# Patient Record
Sex: Male | Born: 1960
Health system: Southern US, Community
[De-identification: ages and names within clinical notes are randomized; demographics above are authoritative.]

## PROBLEM LIST (undated history)

## (undated) DIAGNOSIS — E119 Type 2 diabetes mellitus without complications: Secondary | ICD-10-CM

## (undated) DIAGNOSIS — I10 Essential (primary) hypertension: Secondary | ICD-10-CM

## (undated) DIAGNOSIS — E785 Hyperlipidemia, unspecified: Secondary | ICD-10-CM

## (undated) DIAGNOSIS — M549 Dorsalgia, unspecified: Secondary | ICD-10-CM

## (undated) HISTORY — PX: OTHER SURGICAL HISTORY: SHX169

## (undated) HISTORY — DX: Hyperlipidemia, unspecified: E78.5

## (undated) HISTORY — PX: SPINAL FUSION: SHX223

## (undated) HISTORY — PX: LUMBAR FUSION: SHX111

## (undated) HISTORY — DX: Dorsalgia, unspecified: M54.9

---

## 2015-08-31 ENCOUNTER — Telehealth: Payer: Self-pay | Admitting: Behavioral Health

## 2015-08-31 NOTE — Telephone Encounter (Signed)
Unable to reach patient at time of Pre-Visit Call.  Left message for patient to return call when available.    

## 2015-09-01 ENCOUNTER — Ambulatory Visit (INDEPENDENT_AMBULATORY_CARE_PROVIDER_SITE_OTHER): Payer: BLUE CROSS/BLUE SHIELD | Admitting: Medical

## 2015-09-01 ENCOUNTER — Encounter: Payer: Self-pay | Admitting: Medical

## 2015-09-01 VITALS — BP 128/80 | HR 89 | Temp 98.2°F | Ht 69.75 in | Wt 204.6 lb

## 2015-09-01 DIAGNOSIS — K429 Umbilical hernia without obstruction or gangrene: Secondary | ICD-10-CM

## 2015-09-01 DIAGNOSIS — M5440 Lumbago with sciatica, unspecified side: Secondary | ICD-10-CM

## 2015-09-01 DIAGNOSIS — G894 Chronic pain syndrome: Secondary | ICD-10-CM

## 2015-09-01 MED ORDER — OXYCODONE-ACETAMINOPHEN 10-325 MG PO TABS: ORAL_TABLET | ORAL | Status: DC

## 2015-09-01 MED FILL — OXYCODONE/APAP 10/325 MG TA: 10-325 | 30 days supply | Qty: 60 | Fill #0

## 2015-09-01 NOTE — Patient Instructions (Addendum)
For your chronic severe back pain I am filling rx of your oxycodone. You signed contract for controlled med today.  I am going to refer you to neurosurgeon. Please given release of information forms to check out staff and make sure filled out correctly.  If you have severe pain with leg weakness, numbness, foot drop, or incontinence then ED evaluation.  Will refer you to general surgeon to evaluate your umbilical hernia. If you increasing pain over hernia site then ED evaluation as well.  Follow up in one month or as needed

## 2015-09-01 NOTE — Progress Notes (Signed)
Pre visit review using our clinic review tool, if applicable. No additional management support is needed unless otherwise documented below in the visit note. 

## 2015-09-01 NOTE — Progress Notes (Signed)
Subjective:    Patient ID: Jeffrey Austin, male    DOB: [birth year], 55 y.o.   MRN: [id number]  HPI  I have reviewed pt PMH, PSH, FH, Social History and Surgical History.   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   Pt in wanting refill for medication. Pt is on pain medication. Pt has history of spinal fusion in August 2016.(L4-S1 was the area of Surgery). Pt had surgery done in [place] and moved 3 weeks ago.  Pt had CT scan done and per pt his pelvic bone pushing on spine related to bon overgrowth.(per pt). Prior surgeon states will need injection. Pt takes percocet twice daily since August 2016.(this is first refill of medications he is getting in state of [place])  High level pain without medications.  Also has umbilical hernia. He states secondary to his surgery. Hernia hurts intermittently. But not daily or severe.    Review of Systems  Constitutional: Negative for fever, chills and fatigue.  Respiratory: Negative for cough, chest tightness, shortness of breath and wheezing.   Cardiovascular: Negative for chest pain and palpitations.  Gastrointestinal: Negative for nausea, abdominal pain, diarrhea and constipation.       Umbilical hernia.  Genitourinary: Negative for dysuria and flank pain.  Musculoskeletal: Positive for back pain. Negative for arthralgias, gait problem and neck pain.  Skin: Negative for rash.  Neurological: Negative for dizziness and headaches.       Some radicular pain to both legs.  Hematological: Negative for adenopathy. Does not bruise/bleed easily.  Psychiatric/Behavioral: Negative for behavioral problems and confusion.    Past Medical History  Diagnosis Date  . Back pain      Social History   Social History  . Marital Status: Married    Spouse Name: N/A  . Number of Children: N/A  . Years of Education: N/A   Occupational History  . Not on file.   Social History  Main Topics  . Smoking status: Never Smoker   . Smokeless tobacco: Never Used  . Alcohol Use: 0.0 oz/week    0 Standard drinks or equivalent per week     Comment: 1-2 beers a week  . Drug Use: Yes  . Sexual Activity: Yes   Other Topics Concern  . Not on file   Social History Narrative  . No narrative on file    Past Surgical History  Procedure Laterality Date  . Spinal fusion    . Lumbar fusion    . Cyst removed from testicle       Family History  Problem Relation Age of Onset  . Leukemia Father     No Known Allergies  No current outpatient prescriptions on file prior to visit.   No current facility-administered medications on file prior to visit.    BP 128/80 mmHg  Pulse 89  Temp(Src) 98.2 F (36.8 C) (Oral)  Ht 5' 9.75" (1.772 m)  Wt 204 lb 9.6 oz (92.806 kg)  BMI 29.56 kg/m2  SpO2 98%       Objective:   Physical Exam  General Appearance- Not in acute distress. But appears to have severe pain on changing positions on exam.    Chest and Lung Exam Auscultation: Breath sounds:-Normal. Clear even and unlabored. Adventitious sounds:- No Adventitious sounds.  Cardiovascular Auscultation:Rythm - Regular, rate and rythm. Heart Sounds -Normal heart sounds.  Abdomen Inspection:-Inspection Normal.  Palpation/Perucssion: Palpation and Percussion of the  abdomen reveal- Non Tender(moderate sized umbilical hernia), No Rebound tenderness, No rigidity(Guarding) and No Palpable abdominal masses.  Liver:-Normal.  Spleen:- Normal.   Back Mid lumbar spine tenderness to palpation. Pain on straight leg lift. Pain on lateral movements and flexion/extension of the spine.  Lower ext neurologic  L5-S1 sensation intact bilaterally. Normal patellar reflexes bilaterally. No foot drop bilaterally.      Assessment & Plan:  For your chronic severe back pain I am filling rx of your oxycodone. You signed contract for controlled med today.  I am going to refer you to  neurosurgeon. Please given release of information forms to check out staff and make sure filled out correctly.  If you have severe pain with leg weakness, numbness, foot drop, or incontinence then ED evaluation.  Will refer you to general surgeon to evaluate your umbilical hernia. If you increasing pain over hernia site then ED evaluation as well.  Follow up in one month or as needed  Mina Babula, Percell Miller, [place]

## 2015-09-07 ENCOUNTER — Telehealth: Payer: Self-pay | Admitting: *Deleted

## 2015-09-07 NOTE — Telephone Encounter (Signed)
Forwarded to [employer]. JG//CMA

## 2015-09-15 ENCOUNTER — Ambulatory Visit: Payer: Self-pay | Admitting: General Surgery

## 2015-09-29 ENCOUNTER — Other Ambulatory Visit: Payer: Self-pay | Admitting: Medical

## 2015-09-29 NOTE — Telephone Encounter (Signed)
Advise on this refill as is not on current/or historical list.

## 2015-09-29 NOTE — Telephone Encounter (Signed)
Caller name: Kathaleen Maser Relation to pt: spouse Call back number: [phone] Pharmacy: [facility]  Reason for call: Pt's wife came in office requesting refill on Betamethasone Valerate lotion USP, 0.1% 85ml for spouse. Wife states pt was seen recently but pt forgot to ask for rx. Please advise.

## 2015-09-29 NOTE — Telephone Encounter (Signed)
I reviewed note and no mention of skin condition. He was supposed to follow up in a month anyway. Don't feel comfortable for writing rx for condition that have not reviewed. So can he schedule appointment tomorrow or when I am back in office.?

## 2015-09-30 ENCOUNTER — Encounter: Payer: Self-pay | Admitting: Medical

## 2015-09-30 ENCOUNTER — Other Ambulatory Visit: Payer: Self-pay

## 2015-09-30 MED ORDER — OXYCODONE-ACETAMINOPHEN 10-325 MG PO TABS: ORAL_TABLET | ORAL | 0 refills | Status: DC

## 2015-09-30 MED FILL — OXYCODONE-APAP 10-325 TAB: 10-325 | 30 days supply | Qty: 60 | Fill #0

## 2015-09-30 NOTE — Telephone Encounter (Signed)
Left message for pt to call back in reference to the previous note from [place].

## 2015-09-30 NOTE — Telephone Encounter (Signed)
Called left message to call back 

## 2015-09-30 NOTE — Telephone Encounter (Signed)
Spoke with pt and his oxycodone has been printed and waiting for pcp signature.

## 2015-09-30 NOTE — Telephone Encounter (Signed)
error:[id number] ° °

## 2015-09-30 NOTE — Telephone Encounter (Signed)
Spoke with pt and advised him that his Rx for his Percocet  would be ready up front this afternoon. Pt did not have any further questions.

## 2015-10-10 ENCOUNTER — Encounter: Payer: Self-pay | Admitting: Medical

## 2015-10-10 ENCOUNTER — Ambulatory Visit (INDEPENDENT_AMBULATORY_CARE_PROVIDER_SITE_OTHER): Payer: BLUE CROSS/BLUE SHIELD | Admitting: Medical

## 2015-10-10 VITALS — BP 122/80 | HR 78 | Temp 98.2°F | Ht 70.0 in | Wt 209.4 lb

## 2015-10-10 DIAGNOSIS — L309 Dermatitis, unspecified: Secondary | ICD-10-CM

## 2015-10-10 DIAGNOSIS — K429 Umbilical hernia without obstruction or gangrene: Secondary | ICD-10-CM

## 2015-10-10 DIAGNOSIS — M5440 Lumbago with sciatica, unspecified side: Secondary | ICD-10-CM | POA: Diagnosis not present

## 2015-10-10 DIAGNOSIS — G894 Chronic pain syndrome: Secondary | ICD-10-CM

## 2015-10-10 DIAGNOSIS — G4700 Insomnia, unspecified: Secondary | ICD-10-CM

## 2015-10-10 MED ORDER — BETAMETHASONE VALERATE 0.1 % EX LOTN: 1.0000 "application " | TOPICAL_LOTION | Freq: Every day | CUTANEOUS | 0 refills | Status: DC

## 2015-10-10 MED FILL — BETAMETHASONE VA 0.1% LOTIO: 0.1 | 30 days supply | Qty: 60 | Fill #0

## 2015-10-10 NOTE — Progress Notes (Signed)
Pre visit review using our clinic review tool, if applicable. No additional management support is needed unless otherwise documented below in the visit note./HSM  

## 2015-10-10 NOTE — Patient Instructions (Addendum)
For your back pain continue oxycodone. Please get UDS today. When you run out of current rx call our office and we can have you come by and pick up next refill.  When you see neurosurgeon and determine if surgery needed again for your back please notify us. Give [place] update on potential hernia surgery as well. That way can schedule you colonoscopy as well.  For dermatitis will refill your betamethasone. (but use sparingly as we discussed)  For your insomnia [facility] should be able to refill your ambien.   Follow up 3 months or as needed

## 2015-10-10 NOTE — Progress Notes (Signed)
Subjective:    Patient ID: Jeffrey Austin, male    DOB: 26-Jul-1960, 55 y.o.   MRN: [id number]  HPI   Pt in for follow up.  Pt is still has some chronic and severe back pain.  Pt has seen specialist/general surgeon. Pt states general surgeon wants him to see neurosurgeon before he gets hernia surgery.  Pt anticipates that he might have some problems with getting back surgery again. Last surgery about one year ago and he states took 7 hours.  Pt has been on oxycodone twice daily(takes at 7 am and at 4 pm)  Pt is seeing neurosurgeon on 10-25-2015.   On review of pt maintenance needed appears needs colonoscopy.   Pt states tdap last years.  Pt uses bethamasone valerate. He gets dry flaky skin around nares and between eyes. Uses just one time a month.  For your insomnia [facility] They should be able to refill your ambien.  Also he has history of insomnia. He uses ambien rarely. It appears he has active refill of his zolpidem.  No incontinence, no saddle anesthesia and no foot drop on review.           Review of Systems  Constitutional: Negative for chills, fatigue and fever.  Respiratory: Negative for cough, choking, shortness of breath and wheezing.   Cardiovascular: Negative for chest pain and palpitations.  Gastrointestinal: Negative for abdominal pain and diarrhea.  Endocrine: Negative for polydipsia, polyphagia and polyuria.  Musculoskeletal: Positive for back pain.       Radiclular pain.  Skin: Positive for rash.       History of dermititis.  Neurological: Negative for dizziness, syncope, weakness, light-headedness and headaches.  Hematological: Negative for adenopathy. Does not bruise/bleed easily.  Psychiatric/Behavioral: Negative for behavioral problems and confusion.     Past Medical History:  Diagnosis Date  . Back pain      Social History   Social History  . Marital status: Married    Spouse name: N/A  . Number of children:  N/A  . Years of education: N/A   Occupational History  . Not on file.   Social History Main Topics  . Smoking status: Never Smoker  . Smokeless tobacco: Never Used  . Alcohol use 0.0 oz/week     Comment: 1-2 beers a week  . Drug use:   . Sexual activity: Yes   Other Topics Concern  . Not on file   Social History Narrative  . No narrative on file    Past Surgical History:  Procedure Laterality Date  . cyst removed from testicle     . LUMBAR FUSION    . SPINAL FUSION      Family History  Problem Relation Age of Onset  . Leukemia Father     No Known Allergies  Current Outpatient Prescriptions on File Prior to Visit  Medication Sig Dispense Refill  . oxyCODONE-acetaminophen (PERCOCET) 10-325 MG tablet 1 tab po bid 60 tablet 0   No current facility-administered medications on file prior to visit.     BP 122/80 (BP Location: Left Arm, Patient Position: Sitting, Cuff Size: Normal)   Pulse 78   Temp 98.2 F (36.8 C) (Oral)   Ht 5\' 10"  (1.778 m)   Wt 209 lb 6.4 oz (95 kg)   SpO2 98%   BMI 30.05 kg/m       Objective:   Physical Exam  General Appearance- Not in acute distress.    Chest and Lung  Exam Auscultation: Breath sounds:-Normal. Clear even and unlabored. Adventitious sounds:- No Adventitious sounds.  Cardiovascular Auscultation:Rythm - Regular, rate and rythm. Heart Sounds -Normal heart sounds.  Abdomen Inspection:-Inspection Normal.  Palpation/Perucssion: Palpation and Percussion of the abdomen reveal- Non Tender, No Rebound tenderness, No rigidity(Guarding) and No Palpable abdominal masses.  Liver:-Normal.  Spleen:- Normal.   Back Mid lumbar spine tenderness to palpation. Pain on lateral movements and flexion/extension of the spine.  Lower ext neurologic  L5-S1 sensation intact bilaterally. Normal patellar reflexes bilaterally. No foot drop bilaterally.   Skin- on his face. No active lesions/moles. No discoloration. No flaky skin  presently.     Assessment & Plan:  For your back pain continue oxycodone. Please get UDS today. When you run out of current rx call our office and we can have you come by and pick up next refill.  When you see neurosurgeon and determine if surgery needed again for your back please notify us. Give [place] update on potential hernia surgery as well. That way can schedule you colonoscopy as well.  For dermatitis will refill your betamethasone. (but use sparingly as we discussed)  Follow up 3 months or as needed

## 2015-10-31 ENCOUNTER — Telehealth: Payer: Self-pay | Admitting: Medical

## 2015-10-31 MED ORDER — OXYCODONE-ACETAMINOPHEN 10-325 MG PO TABS: ORAL_TABLET | ORAL | 0 refills | Status: DC

## 2015-10-31 MED FILL — OXYCODONE-APAP 10-325 TAB: 10-325 | 30 days supply | Qty: 60 | Fill #0

## 2015-10-31 NOTE — Telephone Encounter (Signed)
Pt calling to get percocet early. I had filled his med as he was taken it in the past. Each rx was twice a day.(Enough for one month) Now he is asking for refill sooner?  Per contract he signed it states if he runs out early won't get refill.   Let me know his situation.What happened. With tight strict guidelines/[place] laws, I would need to run this by my supervisor.

## 2015-10-31 NOTE — Telephone Encounter (Signed)
Refill request for Medication oxyCODONE-acetaminophen (PERCOCET) 10-325 MG tablet    CB: [phone]

## 2015-10-31 NOTE — Telephone Encounter (Signed)
Noted  

## 2015-10-31 NOTE — Telephone Encounter (Signed)
At lunch I realized that pt probably does need rx now. So would you please disregard the last note regarding refill of percocet until I review.

## 2015-10-31 NOTE — Telephone Encounter (Signed)
I did refill his percocet rx. He can pick up rx. I will leave it up front. I did notify pt.   Pt states old records still in mail.   So neurosurgeon will see him now on Sept 5, 2017.  I notified pt.

## 2015-11-22 ENCOUNTER — Other Ambulatory Visit (HOSPITAL_COMMUNITY): Payer: Self-pay | Admitting: Neurosurgery

## 2015-11-22 DIAGNOSIS — M4726 Other spondylosis with radiculopathy, lumbar region: Secondary | ICD-10-CM

## 2015-11-23 ENCOUNTER — Other Ambulatory Visit: Payer: Self-pay | Admitting: Neurosurgery

## 2015-11-29 ENCOUNTER — Telehealth: Payer: Self-pay | Admitting: Medical

## 2015-11-29 MED ORDER — OXYCODONE-ACETAMINOPHEN 10-325 MG PO TABS: ORAL_TABLET | ORAL | 0 refills | Status: DC

## 2015-11-29 NOTE — Telephone Encounter (Signed)
Called and informed patient that he coud pick up medication.

## 2015-11-29 NOTE — Telephone Encounter (Signed)
RX filed at front for pick up

## 2015-11-29 NOTE — Telephone Encounter (Signed)
Caller name: Relationship to patient: Self Can be reached: [phone]  Pharmacy:  Reason for call: Request refill on oxyCODONE-acetaminophen (PERCOCET) 10-325 MG tablet [id number]

## 2015-11-29 NOTE — Telephone Encounter (Signed)
°  Relation to [id number] Call back number:[phone]   Reason for call:  Patient states he will have only 1 pill left of oxyCODONE-acetaminophen (PERCOCET) 10-325 MG tablet due to the fact he took a xtra pill due to the pain he's experiencing. Patient requesting 1 pill please advise

## 2015-11-29 NOTE — Telephone Encounter (Signed)
Pt can pick up his percocet from office.

## 2015-11-29 NOTE — Progress Notes (Signed)
Scheduling pre op appt- please COMPLETE SURGICAL ORDERS  Thanks--(previous orders from 09/15/15 will "fall off" after 3 months )

## 2015-11-30 NOTE — Telephone Encounter (Signed)
Regarding Jeffrey Austin pain. Would you offer him toradol 60 mg im nurse visit today. This I think will get him through until tomorrow as  toradol can be used for kidney stones, severe migraines, broken bones and severe back pain. Then start back on pain med tomorrow. This is work around since [employer] are very strict.

## 2015-12-01 MED FILL — OXYCODONE-APAP 10-325: 10-325 | 30 days supply | Qty: 60 | Fill #0

## 2015-12-09 ENCOUNTER — Encounter: Payer: Self-pay | Admitting: Medical

## 2015-12-09 ENCOUNTER — Ambulatory Visit (INDEPENDENT_AMBULATORY_CARE_PROVIDER_SITE_OTHER): Payer: Medicare Other | Admitting: Medical

## 2015-12-09 ENCOUNTER — Encounter (HOSPITAL_COMMUNITY): Payer: Self-pay

## 2015-12-09 ENCOUNTER — Telehealth: Payer: Self-pay | Admitting: Medical

## 2015-12-09 VITALS — BP 126/80 | HR 72 | Temp 98.3°F | Ht 70.0 in | Wt 209.2 lb

## 2015-12-09 DIAGNOSIS — I10 Essential (primary) hypertension: Secondary | ICD-10-CM

## 2015-12-09 DIAGNOSIS — E118 Type 2 diabetes mellitus with unspecified complications: Secondary | ICD-10-CM

## 2015-12-09 DIAGNOSIS — G894 Chronic pain syndrome: Secondary | ICD-10-CM | POA: Diagnosis not present

## 2015-12-09 DIAGNOSIS — E119 Type 2 diabetes mellitus without complications: Secondary | ICD-10-CM | POA: Diagnosis not present

## 2015-12-09 LAB — COMPREHENSIVE METABOLIC PANEL
ALBUMIN: 3.9 g/dL (ref 3.6–5.1)
ALT: 65 U/L — ABNORMAL HIGH (ref 9–46)
AST: 66 U/L — AB (ref 10–35)
Alkaline Phosphatase: 80 U/L (ref 40–115)
BILIRUBIN TOTAL: 1.1 mg/dL (ref 0.2–1.2)
BUN: 9 mg/dL (ref 7–25)
CALCIUM: 9.4 mg/dL (ref 8.6–10.3)
CO2: 29 mmol/L (ref 20–31)
CREATININE: 0.91 mg/dL (ref 0.70–1.33)
Chloride: 93 mmol/L — ABNORMAL LOW (ref 98–110)
Glucose, Bld: 207 mg/dL — ABNORMAL HIGH (ref 65–99)
Potassium: 3.8 mmol/L (ref 3.5–5.3)
SODIUM: 131 mmol/L — AB (ref 135–146)
Total Protein: 7.1 g/dL (ref 6.1–8.1)

## 2015-12-09 LAB — HEMOGLOBIN A1C
HEMOGLOBIN A1C: 10 % — AB (ref <5.7)
MEAN PLASMA GLUCOSE: 240 mg/dL

## 2015-12-09 LAB — GLUCOSE, POCT (MANUAL RESULT ENTRY)

## 2015-12-09 MED ORDER — METFORMIN HCL 1000 MG PO TABS: 1000.0000 mg | ORAL_TABLET | Freq: Two times a day (BID) | ORAL | 3 refills | Status: DC

## 2015-12-09 MED FILL — metFORMIN HCL 1000 MG TABS: 1000 | 30 days supply | Qty: 60 | Fill #0

## 2015-12-09 NOTE — Patient Instructions (Addendum)
For your diabetes and recent high blood sugars will get cmp and a1c today. Will rx metformin tab to take twice daily.  For your blood pressure continue amlodipine. I don't think you hyzaar presently. If bp increases in future may add back on.  For your back pain continue the percocet. Follow up with neurosurgeon.  For your hernia see general surgeon.  Follow up in with me in 3 months or as needed. On follow up please schedule early am appointment so we can do fasting lipid panel.

## 2015-12-09 NOTE — Progress Notes (Signed)
Pre visit review using our clinic tool,if applicable. No additional management support is needed unless otherwise documented below in the visit note.  

## 2015-12-09 NOTE — Progress Notes (Signed)
Subjective:    Patient ID: Jeffrey Austin, male    DOB: 10-Feb-1961, 55 y.o.   MRN: [id number]  HPI  Pt in for for follow up.  Pt  Update me on med he was before while in [place]. He realized just recently was on hyzaar but had not been taking. He has some at home.  Only on amlodipine 10 mg a day on recent visits with me.Marland Kitchen His bp has been controlled. No cardiac or neurologic signs or symptoms.  Pt was also on metformin 1000 mg one tab a day. Pt blood sugar was 178 yesterday. Today when we checked was 215. He was on that dose since his back injury. Pt moved form [place] in July. Last saw his prior provider march or April.He had stopped using  metformin around April. No hyperglycemic signs or symptom.  Pt still has severe lower back pain and he is seeing his specialist. He is going to go through diagnostic procedure with neursurgeon. This will be on January 18, 2016.  Pt is going to get hernia repair in November as well.  Pt is not fasting.  Pt declines flu vaccine. He states never had flu or vaccine in the past.  Review of Systems  Constitutional: Negative for chills, fatigue and fever.  Respiratory: Negative for cough, chest tightness and wheezing.   Cardiovascular: Negative for chest pain and palpitations.  Gastrointestinal: Negative for abdominal pain.  Endocrine: Negative for polydipsia, polyphagia and polyuria.  Musculoskeletal: Positive for back pain. Negative for arthralgias, gait problem, myalgias, neck pain and neck stiffness.  Skin: Negative for rash.  Neurological: Negative for dizziness, weakness and headaches.  Hematological: Negative for adenopathy. Does not bruise/bleed easily.  Psychiatric/Behavioral: Negative for behavioral problems and confusion.    Past Medical History:  Diagnosis Date  . Back pain      Social History   Social History  . Marital status: Married    Spouse name: N/A  . Number of children: N/A  . Years of education: N/A   Occupational  History  . Not on file.   Social History Main Topics  . Smoking status: Never Smoker  . Smokeless tobacco: Never Used  . Alcohol use 0.0 oz/week     Comment: 1-2 beers a week  . Drug use:   . Sexual activity: Yes   Other Topics Concern  . Not on file   Social History Narrative  . No narrative on file    Past Surgical History:  Procedure Laterality Date  . cyst removed from testicle     . LUMBAR FUSION    . SPINAL FUSION      Family History  Problem Relation Age of Onset  . Leukemia Father     No Known Allergies  Current Outpatient Prescriptions on File Prior to Visit  Medication Sig Dispense Refill  . amLODipine (NORVASC) 10 MG tablet     . betamethasone valerate lotion (VALISONE) 0.1 % Apply 1 application topically daily. 60 mL 0  . oxyCODONE-acetaminophen (PERCOCET) 10-325 MG tablet 1 tab po bid 60 tablet 0   No current facility-administered medications on file prior to visit.     BP 126/80   Pulse 72   Temp 98.3 F (36.8 C) (Oral)   Ht 5\' 10"  (1.778 m)   Wt 209 lb 3.2 oz (94.9 kg)   SpO2 99%   BMI 30.02 kg/m       Objective:   Physical Exam  General Mental Status- Alert. General Appearance- Not in  acute distress.   Skin General: Color- Normal Color. Moisture- Normal Moisture.  Neck Carotid Arteries- Normal color. Moisture- Normal Moisture. No carotid bruits. No JVD.  Chest and Lung Exam Auscultation: Breath Sounds:-Normal.  Cardiovascular Auscultation:Rythm- Regular. Murmurs & Other Heart Sounds:Auscultation of the heart reveals- No Murmurs.  Abdomen Inspection:-Inspeection Normal. Palpation/Percussion:Note:No mass. Palpation and Percussion of the abdomen reveal- Non Tender, Non Distended + BS, no rebound or guarding. Obvious umbilical area hernia on palpation.    Neurologic Cranial Nerve exam:- CN III-XII intact(No nystagmus), symmetric smile. Drift Test:- No drift. Romberg Exam:- Negative.  Heal to Toe Gait exam:-Normal. Finger  to Nose:- Normal/Intact Strength:- 5/5 equal and symmetric strength both upper and lower extremities.  Back- lumbar spine pain on sitting. Marland Kitchen He is uncomfortable standing in any one position for any period of time as well(he explains this is his baseline chronic pain) .       Assessment & Plan:  For your diabetes and recent high blood sugars will get cmp and a1c today. Will rx metformin tab to take twice daily.  For your blood pressure continue amlodipine. I don't think you hyzaar presently. If bp increases in future may add back on.  For your back pain continue the percocet. Follow up with neurosurgeon.  For your hernia see general surgeon.  Follow up in with me in 3 months or as needed. On follow up please schedule early am appointment so we can do fasting lipid panel.  Patriciaann Rabanal, Percell Miller, PA-C

## 2015-12-16 ENCOUNTER — Ambulatory Visit: Payer: Self-pay | Admitting: General Surgery

## 2015-12-16 NOTE — Progress Notes (Signed)
Surgery on 10/18.   Preop on 10/16  Need orders in EPIC.  Thank You.

## 2015-12-19 ENCOUNTER — Encounter (HOSPITAL_COMMUNITY)
Admission: RE | Admit: 2015-12-19 | Discharge: 2015-12-19 | Disposition: A | Discharge status: Home / Self Care | Payer: BLUE CROSS/BLUE SHIELD | Source: Ambulatory Visit | Attending: General Surgery | Admitting: General Surgery

## 2015-12-19 ENCOUNTER — Encounter (HOSPITAL_COMMUNITY): Payer: Self-pay

## 2015-12-19 DIAGNOSIS — I10 Essential (primary) hypertension: Secondary | ICD-10-CM | POA: Diagnosis not present

## 2015-12-19 DIAGNOSIS — E119 Type 2 diabetes mellitus without complications: Secondary | ICD-10-CM | POA: Diagnosis not present

## 2015-12-19 DIAGNOSIS — Z01818 Encounter for other preprocedural examination: Secondary | ICD-10-CM | POA: Insufficient documentation

## 2015-12-19 HISTORY — DX: Type 2 diabetes mellitus without complications: E11.9

## 2015-12-19 HISTORY — DX: Essential (primary) hypertension: I10

## 2015-12-19 LAB — CBC WITH DIFFERENTIAL/PLATELET
BASOS ABS: 0 10*3/uL (ref 0.0–0.1)
BASOS PCT: 0 %
Eosinophils Absolute: 0 10*3/uL (ref 0.0–0.7)
Eosinophils Relative: 1 %
HEMATOCRIT: 46.2 % (ref 39.0–52.0)
HEMOGLOBIN: 16.8 g/dL (ref 13.0–17.0)
LYMPHS PCT: 23 %
Lymphs Abs: 1.8 10*3/uL (ref 0.7–4.0)
MCH: 33.9 pg (ref 26.0–34.0)
MCHC: 36.4 g/dL — ABNORMAL HIGH (ref 30.0–36.0)
MCV: 93.3 fL (ref 78.0–100.0)
Monocytes Absolute: 0.7 10*3/uL (ref 0.1–1.0)
Monocytes Relative: 10 %
NEUTROS ABS: 5 10*3/uL (ref 1.7–7.7)
NEUTROS PCT: 66 %
Platelets: 160 10*3/uL (ref 150–400)
RBC: 4.95 MIL/uL (ref 4.22–5.81)
RDW: 12 % (ref 11.5–15.5)
WBC: 7.5 10*3/uL (ref 4.0–10.5)

## 2015-12-19 LAB — BASIC METABOLIC PANEL
ANION GAP: 12 (ref 5–15)
BUN: 9 mg/dL (ref 6–20)
CALCIUM: 9.7 mg/dL (ref 8.9–10.3)
CO2: 29 mmol/L (ref 22–32)
Chloride: 96 mmol/L — ABNORMAL LOW (ref 101–111)
Creatinine, Ser: 0.82 mg/dL (ref 0.61–1.24)
GLUCOSE: 185 mg/dL — AB (ref 65–99)
POTASSIUM: 3.7 mmol/L (ref 3.5–5.1)
Sodium: 137 mmol/L (ref 135–145)

## 2015-12-19 LAB — GLUCOSE, CAPILLARY: Glucose-Capillary: 177 mg/dL — ABNORMAL HIGH (ref 65–99)

## 2015-12-19 MED FILL — AMOXICILLIN 500 MG CAPSULE: 500 | 5 days supply | Qty: 15 | Fill #0

## 2015-12-19 MED FILL — CHLORHEXIDINE 0.12% RINSE: 0.12 | 5 days supply | Qty: 473 | Fill #0

## 2015-12-19 MED FILL — LIDOCAINE 2% VISCOUS SOLN: 2 | 4 days supply | Qty: 100 | Fill #0

## 2015-12-19 NOTE — Progress Notes (Signed)
Patient states he started with a toothache over the weekend. Temp. 99.1 at beginning of interview and at end of interview , temp was 100.2. Patient states has a pinpoint scab on inner aspect of forearm with a red ring halo around it about the size of a dime. Patient thinks it was from a spider bite. Instructed patient and spouse to call Dr. Amie Portland office and let them know of this and to get an appointment with a Dentist to be seen before Wednesday.  Patient and spouse verbalized understanding.

## 2015-12-19 NOTE — Progress Notes (Signed)
08/17/2014- PA and Lateral chest x-ray from [facility] in [place], [place] on chart.

## 2015-12-19 NOTE — Patient Instructions (Addendum)
Jeffrey Austin  12/19/2015   Your procedure is scheduled on: Wednesday 12/21/2015  Report to [name].  Call this number if you have problems the morning of surgery [phone]   Remember: ONLY 1 PERSON MAY GO WITH YOU TO SHORT STAY TO GET  READY MORNING OF [name].   Do not eat food or drink liquids :After Midnight.     Take these medicines the morning of surgery with A SIP OF WATER: none  DO NOT TAKE ANY DIABETIC MEDICATIONS DAY OF YOUR SURGERY!                               You may not have any metal on your body including hair pins and              piercings  Do not wear jewelry, make-up, lotions, powders or perfumes, deodorant             Do not wear nail polish.  Do not shave  48 hours prior to surgery.              Men may shave face and neck.   Do not bring valuables to the hospital. [name].  Contacts, dentures or bridgework may not be worn into surgery.  Leave suitcase in the car. After surgery it may be brought to your room.     r:  Special Instructions: N/A              Please read over the following fact sheets you were given: _____________________________________________________________________             [name] - Preparing for Surgery Before surgery, you can play an important role.  Because skin is not sterile, your skin needs to be as free of germs as possible.  You can reduce the number of germs on your skin by washing with CHG (chlorahexidine gluconate) soap before surgery.  CHG is an antiseptic cleaner which kills germs and bonds with the skin to continue killing germs even after washing. Please DO NOT use if you have an allergy to CHG or antibacterial soaps.  If your skin becomes reddened/irritated stop using the CHG and inform your nurse when you arrive at Short Stay. Do not shave  (including legs and underarms) for at least 48 hours prior to the first CHG shower.  You may shave your face/neck. Please follow these instructions carefully:  1.  Shower with CHG Soap the night before surgery and the  morning of Surgery.  2.  If you choose to wash your hair, wash your hair first as usual with your  normal  shampoo.  3.  After you shampoo, rinse your hair and body thoroughly to remove the  shampoo.                           4.  Use CHG as you would any other liquid soap.  You can apply chg directly  to the skin and wash  Gently with a scrungie or clean washcloth.  5.  Apply the CHG Soap to your body ONLY FROM THE NECK DOWN.   Do not use on face/ open                           Wound or open sores. Avoid contact with eyes, ears mouth and genitals (private parts).                       Wash face,  Genitals (private parts) with your normal soap.             6.  Wash thoroughly, paying special attention to the area where your surgery  will be performed.  7.  Thoroughly rinse your body with warm water from the neck down.  8.  DO NOT shower/wash with your normal soap after using and rinsing off  the CHG Soap.                9.  Pat yourself dry with a clean towel.            10.  Wear clean pajamas.            11.  Place clean sheets on your bed the night of your first shower and do not  sleep with pets. Day of Surgery : Do not apply any lotions/deodorants the morning of surgery.  Please wear clean clothes to the hospital/surgery center.  FAILURE TO FOLLOW THESE INSTRUCTIONS MAY RESULT IN THE CANCELLATION OF YOUR SURGERY ________________________________________________________________________ How to Manage Your Diabetes Before and After Surgery  Why is it important to control my blood sugar before and after surgery? . Improving blood sugar levels before and after surgery helps healing and can limit problems. . A way of improving blood sugar control is eating a  healthy diet by: o  Eating less sugar and carbohydrates o  Increasing activity/exercise o  Talking with your doctor about reaching your blood sugar goals . High blood sugars (greater than 180 mg/dL) can raise your risk of infections and slow your recovery, so you will need to focus on controlling your diabetes during the weeks before surgery. . Make sure that the doctor who takes care of your diabetes knows about your planned surgery including the date and location.  How do I manage my blood sugar before surgery? . Check your blood sugar at least 4 times a day, starting 2 days before surgery, to make sure that the level is not too high or low. o Check your blood sugar the morning of your surgery when you wake up and every 2 hours until you get to the Short Stay unit. . If your blood sugar is less than 70 mg/dL, you will need to treat for low blood sugar: o Do not take insulin. o Treat a low blood sugar (less than 70 mg/dL) with  cup of clear juice (cranberry or apple), 4 glucose tablets, OR glucose gel. o Recheck blood sugar in 15 minutes after treatment (to make sure it is greater than 70 mg/dL). If your blood sugar is not greater than 70 mg/dL on recheck, call [phone] for further instructions. . Report your blood sugar to the short stay nurse when you get to Short Stay.  . If you are admitted to the hospital after surgery: o Your blood sugar will be checked by the staff and you will probably be given insulin after surgery (instead of  oral diabetes medicines) to make sure you have good blood sugar levels. o The goal for blood sugar control after surgery is 80-180 mg/dL.   WHAT DO I DO ABOUT MY DIABETES MEDICATION?   Marland Kitchen Do not take oral diabetes medicines (pills) the morning of surgery.  . THE NIGHT BEFORE SURGERY, take ______0_____ units of ___________insulin.       . THE MORNING OF SURGERY, take _______0______ units of __________insulin.  . The day of surgery, do not take other  diabetes injectables, including Byetta (exenatide), Bydureon (exenatide ER), Victoza (liraglutide), or Trulicity (dulaglutide).  . If your CBG is greater than 220 mg/dL, come on to the hospital at [name] am !  Patient Signature:  Date:   Nurse Signature:  Date:   Reviewed and Endorsed by [facility] Patient Education Committee, August 2015

## 2015-12-21 DIAGNOSIS — Z01818 Encounter for other preprocedural examination: Secondary | ICD-10-CM | POA: Diagnosis not present

## 2015-12-23 ENCOUNTER — Ambulatory Visit (HOSPITAL_COMMUNITY): Payer: BLUE CROSS/BLUE SHIELD

## 2015-12-23 ENCOUNTER — Other Ambulatory Visit (HOSPITAL_COMMUNITY): Payer: BLUE CROSS/BLUE SHIELD

## 2015-12-27 MED FILL — CHLORHEXIDINE 0.12% RINSE: 0.12 | 16 days supply | Qty: 473 | Fill #0

## 2015-12-27 MED FILL — IBUPROFEN 600 MG TABLET: 600 | 7 days supply | Qty: 30 | Fill #0

## 2015-12-27 MED FILL — AMOX-CLAV 875-125 MG TABLET: 875-125 | 14 days supply | Qty: 28 | Fill #0

## 2015-12-30 ENCOUNTER — Telehealth: Payer: Self-pay | Admitting: Medical

## 2015-12-30 MED ORDER — OXYCODONE-ACETAMINOPHEN 10-325 MG PO TABS: ORAL_TABLET | ORAL | 0 refills | Status: DC

## 2015-12-30 MED FILL — OXYCODONE-APAP 10-325: 10-325 | 30 days supply | Qty: 60 | Fill #0

## 2015-12-30 NOTE — Telephone Encounter (Signed)
Appears due for his pain meds. Printed and signed. He can pick up today.

## 2015-12-30 NOTE — Telephone Encounter (Signed)
I left a message that his Rx. Rx is ready at front desk.

## 2015-12-30 NOTE — Telephone Encounter (Signed)
Please advise 

## 2015-12-30 NOTE — Telephone Encounter (Signed)
Relation to [id number] Call back [phone]   Reason for call:  Patient requesting a refill oxyCODONE-acetaminophen (PERCOCET) 10-325 MG tablet

## 2016-01-06 NOTE — Telephone Encounter (Signed)
error:[id number] ° °

## 2016-01-11 MED FILL — AMOX-CLAV 875-125 MG TABLET: 875-125 | 21 days supply | Qty: 42 | Fill #0

## 2016-01-18 ENCOUNTER — Ambulatory Visit (HOSPITAL_COMMUNITY)
Admission: RE | Admit: 2016-01-18 | Discharge: 2016-01-18 | Disposition: A | Discharge status: Home / Self Care | Payer: BLUE CROSS/BLUE SHIELD | Source: Ambulatory Visit | Attending: Neurosurgery | Admitting: Neurosurgery

## 2016-01-18 DIAGNOSIS — M5126 Other intervertebral disc displacement, lumbar region: Secondary | ICD-10-CM | POA: Diagnosis not present

## 2016-01-18 DIAGNOSIS — M4726 Other spondylosis with radiculopathy, lumbar region: Secondary | ICD-10-CM

## 2016-01-18 LAB — GLUCOSE, CAPILLARY: Glucose-Capillary: 195 mg/dL — ABNORMAL HIGH (ref 65–99)

## 2016-01-18 MED ORDER — OXYCODONE HCL 5 MG PO TABS
ORAL_TABLET | ORAL | Status: AC
  Filled 2016-01-18: qty 1

## 2016-01-18 MED ORDER — OXYCODONE HCL 5 MG PO TABS
5.0000 mg | ORAL_TABLET | ORAL | Status: DC | PRN
Start: 2016-01-18 — End: 2016-01-19
  Administered 2016-01-18: 5 mg via ORAL

## 2016-01-18 MED ORDER — DIAZEPAM 5 MG PO TABS
10.0000 mg | ORAL_TABLET | Freq: Once | ORAL | Status: AC
  Administered 2016-01-18: 10 mg via ORAL
  Filled 2016-01-18: qty 2

## 2016-01-18 MED ORDER — ONDANSETRON HCL 4 MG/2ML IJ SOLN: 4.0000 mg | Freq: Four times a day (QID) | INTRAMUSCULAR | Status: DC | PRN

## 2016-01-18 MED ORDER — LIDOCAINE HCL (PF) 1 % IJ SOLN
5.0000 mL | Freq: Once | INTRAMUSCULAR | Status: AC
  Administered 2016-01-18: 5 mL via INTRADERMAL

## 2016-01-18 MED ORDER — IOPAMIDOL (ISOVUE-M 200) INJECTION 41%
20.0000 mL | Freq: Once | INTRAMUSCULAR | Status: AC
  Administered 2016-01-18: 20 mL via INTRATHECAL

## 2016-01-18 MED ORDER — DIAZEPAM 5 MG PO TABS
ORAL_TABLET | ORAL | Status: AC
  Administered 2016-01-18: 10 mg via ORAL
  Filled 2016-01-18: qty 2

## 2016-01-18 NOTE — Op Note (Signed)
  Lumbar Myelogram  PATIENT:  Jeffrey Austin is a 55 y.o. male  PRE-OPERATIVE DIAGNOSIS:  lumbago  POST-OPERATIVE DIAGNOSIS:  SAME  PROCEDURE:  Lumbar Myelogram  SURGEON:  Rogerio Boutelle  ANESTHESIA:   local LOCAL MEDICATIONS USED:  LIDOCAINE  and Amount: 9 ml Procedure Note: Jeffrey Austin is a 55 y.o. male Was taken to the fluoroscopy suite and  positioned prone on the fluoroscopy table. His back was prepared and draped in a sterile manner. I infiltrated 9 cc into the lumbar region. I then introduced a spinal needle into the thecal sac at the 4/5 interlaminar space. I infiltrated 20cc of Isovue 200 into the thecal sac. Fluoroscopy showed the needle and contrast in the thecal sac. Jeffrey Austin tolerated the procedure well. he Will be taken to CT for evaluation.     PATIENT DISPOSITION:  Short Stay, hemodynamically stable

## 2016-01-18 NOTE — Discharge Instructions (Signed)
Myelogram and Lumbar Puncture Discharge Instructions ° °1. Go home and rest quietly for the next 24 hours.  It is important to lie flat for the next 24 hours.  Get up only to go to the restroom.  You may lie in the bed or on a couch on your back, your stomach, your left side or your right side.  You may have one pillow under your head.  You may have pillows between your knees while you are on your side or under your knees while you are on your back. ° °2. DO NOT drive today.  Recline the seat as far back as it will go, while still wearing your seat belt, on the way home. ° °3. You may get up to go to the bathroom as needed.  You may sit up for 10 minutes to eat.  You may resume your normal diet and medications unless otherwise indicated. ° °4. The incidence of headache, nausea, or vomiting is about 5% (one in 20 patients).  If you develop a headache, lie flat and drink plenty of fluids until the headache goes away.  Caffeinated beverages may be helpful.  If you develop severe nausea and vomiting or a headache that does not go away with flat bed rest, call [phone]. ° °5. You may resume normal activities after your 24 hours of bed rest is over; however, do not exert yourself strongly or do any heavy lifting tomorrow. ° °6. Call your physician for a follow-up appointment.  The results of your myelogram will be sent directly to your physician by the following day. ° °7. If you have any questions or if complications develop after you arrive home, please call [phone]. ° °Discharge instructions have been explained to the patient.  The patient, or the person responsible for the patient, fully understands these instructions. ° ° °

## 2016-01-25 NOTE — Patient Instructions (Addendum)
Jeffrey Austin  01/25/2016   Your procedure is scheduled on: 02/01/16  Report to [facility] Main  Entrance take [place]  elevators to 3rd floor to  [facility]  AM.  Call this number if you have problems the morning of surgery [phone]   Remember: ONLY 1 PERSON MAY GO WITH YOU TO SHORT STAY TO GET  READY MORNING OF YOUR SURGERY.   Do not eat food or drink liquids :After Midnight.     Take these medicines the morning of surgery with A SIP OF WATER: Amlodipine   , Percocet                            How to Manage Your Diabetes Before and After Surgery  Why is it important to control my blood sugar before and after surgery? . Improving blood sugar levels before and after surgery helps healing and can limit problems. . A way of improving blood sugar control is eating a healthy diet by: o  Eating less sugar and carbohydrates o  Increasing activity/exercise o  Talking with your doctor about reaching your blood sugar goals . High blood sugars (greater than 180 mg/dL) can raise your risk of infections and slow your recovery, so you will need to focus on controlling your diabetes during the weeks before surgery. . Make sure that the doctor who takes care of your diabetes knows about your planned surgery including the date and location.  How do I manage my blood sugar before surgery? . Check your blood sugar at least 4 times a day, starting 2 days before surgery, to make sure that the level is not too high or low. o Check your blood sugar the morning of your surgery when you wake up and every 2 hours until you get to the Short Stay unit. . If your blood sugar is less than 70 mg/dL, you will need to treat for low blood sugar: o . o Treat a low blood sugar (less than 70 mg/dL) with  cup of clear juice (cranberry or apple), 4 glucose tablets, OR glucose gel. o Recheck blood sugar in 15 minutes after treatment (to make sure it is greater than 70 mg/dL).  If your blood sugar is not greater than 70 mg/dL on recheck, call [phone] for further instructions. . Report your blood sugar to the short stay nurse when you get to Short Stay.  . If you are admitted to the hospital after surgery: o Your blood sugar will be checked by the staff and you will probably be given insulin after surgery (instead of oral diabetes medicines) to make sure you have good blood sugar levels. o The goal for blood sugar control after surgery is 80-180 mg/dL.   WHAT DO I DO ABOUT MY DIABETES MEDICATION?  Marland Kitchen Do not take oral diabetes medicines (pills) the morning of surgery.                                 You may not have any metal on your body including hair pins and              piercings  Do not wear jewelry, make-up, lotions, powders or perfumes, deodorant             Do not wear  nail polish.  Do not shave  48 hours prior to surgery.              Men may shave face and neck.   Do not bring valuables to the hospital. [place].  Contacts, dentures or bridgework may not be worn into surgery.  Leave suitcase in the car. After surgery it may be brought to your room.               Please read over the following fact sheets you were given: _____________________________________________________________________             [facility] - Preparing for Surgery Before surgery, you can play an important role.  Because skin is not sterile, your skin needs to be as free of germs as possible.  You can reduce the number of germs on your skin by washing with CHG (chlorahexidine gluconate) soap before surgery.  CHG is an antiseptic cleaner which kills germs and bonds with the skin to continue killing germs even after washing. Please DO NOT use if you have an allergy to CHG or antibacterial soaps.  If your skin becomes reddened/irritated stop using the CHG and inform your nurse when you arrive at Short Stay. Do not shave  (including legs and underarms) for at least 48 hours prior to the first CHG shower.  You may shave your face/neck. Please follow these instructions carefully:  1.  Shower with CHG Soap the night before surgery and the  morning of Surgery.  2.  If you choose to wash your hair, wash your hair first as usual with your  normal  shampoo.  3.  After you shampoo, rinse your hair and body thoroughly to remove the  shampoo.                           4.  Use CHG as you would any other liquid soap.  You can apply chg directly  to the skin and wash                       Gently with a scrungie or clean washcloth.  5.  Apply the CHG Soap to your body ONLY FROM THE NECK DOWN.   Do not use on face/ open                           Wound or open sores. Avoid contact with eyes, ears mouth and genitals (private parts).                       Wash face,  Genitals (private parts) with your normal soap.             6.  Wash thoroughly, paying special attention to the area where your surgery  will be performed.  7.  Thoroughly rinse your body with warm water from the neck down.  8.  DO NOT shower/wash with your normal soap after using and rinsing off  the CHG Soap.                9.  Pat yourself dry with a clean towel.            10.  Wear clean pajamas.  11.  Place clean sheets on your bed the night of your first shower and do not  sleep with pets. Day of Surgery : Do not apply any lotions/deodorants the morning of surgery.  Please wear clean clothes to the hospital/surgery center.  FAILURE TO FOLLOW THESE INSTRUCTIONS MAY RESULT IN THE CANCELLATION OF YOUR SURGERY PATIENT SIGNATURE_________________________________  NURSE SIGNATURE__________________________________  ________________________________________________________________________

## 2016-01-25 NOTE — Progress Notes (Signed)
Dr Kieth Brightly-   Please put in epic the consent for surgery-  Since he came in for pre op and was cancelled, the order has been released and I can not view it---PST APPT 01/30/16

## 2016-01-30 ENCOUNTER — Other Ambulatory Visit: Payer: Self-pay

## 2016-01-30 ENCOUNTER — Encounter (HOSPITAL_COMMUNITY)
Admission: RE | Admit: 2016-01-30 | Discharge: 2016-01-30 | Disposition: A | Discharge status: Home / Self Care | Payer: BLUE CROSS/BLUE SHIELD | Source: Ambulatory Visit | Attending: General Surgery | Admitting: General Surgery

## 2016-01-30 ENCOUNTER — Encounter (HOSPITAL_COMMUNITY): Payer: Self-pay

## 2016-01-30 ENCOUNTER — Ambulatory Visit: Payer: Self-pay | Admitting: General Surgery

## 2016-01-30 ENCOUNTER — Telehealth: Payer: Self-pay | Admitting: Medical

## 2016-01-30 DIAGNOSIS — K432 Incisional hernia without obstruction or gangrene: Secondary | ICD-10-CM | POA: Diagnosis not present

## 2016-01-30 DIAGNOSIS — I10 Essential (primary) hypertension: Secondary | ICD-10-CM | POA: Diagnosis not present

## 2016-01-30 DIAGNOSIS — Z01812 Encounter for preprocedural laboratory examination: Secondary | ICD-10-CM | POA: Insufficient documentation

## 2016-01-30 DIAGNOSIS — E119 Type 2 diabetes mellitus without complications: Secondary | ICD-10-CM | POA: Diagnosis not present

## 2016-01-30 LAB — GLUCOSE, CAPILLARY: GLUCOSE-CAPILLARY: 274 mg/dL — AB (ref 65–99)

## 2016-01-30 LAB — CBC
HCT: 46.6 % (ref 39.0–52.0)
HEMOGLOBIN: 16.6 g/dL (ref 13.0–17.0)
MCH: 34.1 pg — AB (ref 26.0–34.0)
MCHC: 35.6 g/dL (ref 30.0–36.0)
MCV: 95.7 fL (ref 78.0–100.0)
Platelets: 150 10*3/uL (ref 150–400)
RBC: 4.87 MIL/uL (ref 4.22–5.81)
RDW: 13 % (ref 11.5–15.5)
WBC: 8.3 10*3/uL (ref 4.0–10.5)

## 2016-01-30 LAB — BASIC METABOLIC PANEL
ANION GAP: 12 (ref 5–15)
BUN: 8 mg/dL (ref 6–20)
CALCIUM: 9.8 mg/dL (ref 8.9–10.3)
CHLORIDE: 98 mmol/L — AB (ref 101–111)
CO2: 27 mmol/L (ref 22–32)
CREATININE: 0.82 mg/dL (ref 0.61–1.24)
GFR calc Af Amer: >60 mL/min (ref >60)
GFR calc non Af Amer: >60 mL/min (ref >60)
GLUCOSE: 247 mg/dL — AB (ref 65–99)
Potassium: 4.1 mmol/L (ref 3.5–5.1)
Sodium: 137 mmol/L (ref 135–145)

## 2016-01-30 MED ORDER — OXYCODONE-ACETAMINOPHEN 10-325 MG PO TABS: ORAL_TABLET | ORAL | 0 refills | Status: DC

## 2016-01-30 MED FILL — OXYCODONE-APAP 10-325: 10-325 | 30 days supply | Qty: 60 | Fill #0

## 2016-01-30 NOTE — Telephone Encounter (Signed)
Called patient to let him know he can pick up Rx for Oxycodone at front desk. Patient agreed.

## 2016-01-30 NOTE — Progress Notes (Signed)
Please place orders in EPIC! Patient got cancelled from previous date so orders were released for that surgery! Thank you! She has appointment at PST 01/30/2016 at 1000 am.

## 2016-01-30 NOTE — Telephone Encounter (Signed)
Ok'd and printed pt percocet. He can pick up today

## 2016-01-30 NOTE — Telephone Encounter (Signed)
Relation to [id number] Call back [phone]   Reason for call:  Patient requesting a refill oxyCODONE-acetaminophen (PERCOCET) 10-325 MG tablet

## 2016-01-30 NOTE — Progress Notes (Signed)
Today's CBG- 274. Results routed to Dr. Kieth Brightly!

## 2016-01-30 NOTE — Telephone Encounter (Signed)
Please advise 

## 2016-02-01 ENCOUNTER — Encounter (HOSPITAL_COMMUNITY): Payer: Self-pay | Admitting: *Deleted

## 2016-02-01 ENCOUNTER — Inpatient Hospital Stay (HOSPITAL_COMMUNITY): Payer: BLUE CROSS/BLUE SHIELD | Admitting: Anesthesiology

## 2016-02-01 ENCOUNTER — Encounter (HOSPITAL_COMMUNITY)
Admission: RE | Disposition: A | Discharge status: Home / Self Care | Payer: Self-pay | Source: Ambulatory Visit | Attending: General Surgery

## 2016-02-01 ENCOUNTER — Ambulatory Visit (HOSPITAL_COMMUNITY)
Admission: RE | Admit: 2016-02-01 | Discharge: 2016-02-01 | Disposition: A | Discharge status: Home / Self Care | Payer: BLUE CROSS/BLUE SHIELD | Source: Ambulatory Visit | Attending: General Surgery | Admitting: General Surgery

## 2016-02-01 DIAGNOSIS — I10 Essential (primary) hypertension: Secondary | ICD-10-CM | POA: Insufficient documentation

## 2016-02-01 DIAGNOSIS — E119 Type 2 diabetes mellitus without complications: Secondary | ICD-10-CM | POA: Diagnosis not present

## 2016-02-01 DIAGNOSIS — K432 Incisional hernia without obstruction or gangrene: Secondary | ICD-10-CM | POA: Insufficient documentation

## 2016-02-01 HISTORY — PX: INSERTION OF MESH: SHX5868

## 2016-02-01 HISTORY — PX: INCISIONAL HERNIA REPAIR: SHX193

## 2016-02-01 LAB — GLUCOSE, CAPILLARY
Glucose-Capillary: 223 mg/dL — ABNORMAL HIGH (ref 65–99)
Glucose-Capillary: 189 mg/dL — ABNORMAL HIGH (ref 65–99)

## 2016-02-01 SURGERY — REPAIR, HERNIA, INCISIONAL, LAPAROSCOPIC
Anesthesia: General

## 2016-02-01 MED ORDER — SUGAMMADEX SODIUM 200 MG/2ML IV SOLN
INTRAVENOUS | Status: DC | PRN
  Administered 2016-02-01: 200 mg via INTRAVENOUS

## 2016-02-01 MED ORDER — OXYCODONE HCL 5 MG/5ML PO SOLN
5.0000 mg | Freq: Once | ORAL | Status: DC | PRN
  Filled 2016-02-01: qty 5

## 2016-02-01 MED ORDER — CEFAZOLIN SODIUM-DEXTROSE 2-4 GM/100ML-% IV SOLN
2.0000 g | INTRAVENOUS | Status: AC
  Administered 2016-02-01: 2 g via INTRAVENOUS
  Filled 2016-02-01: qty 100

## 2016-02-01 MED ORDER — BUPIVACAINE HCL (PF) 0.25 % IJ SOLN
INTRAMUSCULAR | Status: AC
  Filled 2016-02-01: qty 30

## 2016-02-01 MED ORDER — SUFENTANIL CITRATE 50 MCG/ML IV SOLN
INTRAVENOUS | Status: DC | PRN
  Administered 2016-02-01 (×2): 5 ug via INTRAVENOUS
  Administered 2016-02-01: 20 ug via INTRAVENOUS
  Administered 2016-02-01: 10 ug via INTRAVENOUS

## 2016-02-01 MED ORDER — CELECOXIB 200 MG PO CAPS: 400.0000 mg | ORAL_CAPSULE | ORAL | Status: DC

## 2016-02-01 MED ORDER — MIDAZOLAM HCL 2 MG/2ML IJ SOLN
INTRAMUSCULAR | Status: AC
  Filled 2016-02-01: qty 2

## 2016-02-01 MED ORDER — ONDANSETRON HCL 4 MG/2ML IJ SOLN
INTRAMUSCULAR | Status: AC
  Filled 2016-02-01: qty 2

## 2016-02-01 MED ORDER — ONDANSETRON HCL 4 MG/2ML IJ SOLN
INTRAMUSCULAR | Status: DC | PRN
  Administered 2016-02-01: 4 mg via INTRAVENOUS

## 2016-02-01 MED ORDER — ROCURONIUM BROMIDE 10 MG/ML (PF) SYRINGE
PREFILLED_SYRINGE | INTRAVENOUS | Status: DC | PRN
  Administered 2016-02-01: 40 mg via INTRAVENOUS

## 2016-02-01 MED ORDER — KETAMINE HCL 10 MG/ML IJ SOLN
INTRAMUSCULAR | Status: DC | PRN
Start: 2016-02-01 — End: 2016-02-01
  Administered 2016-02-01 (×2): 10 mg via INTRAVENOUS
  Administered 2016-02-01: 50 mg via INTRAVENOUS

## 2016-02-01 MED ORDER — BUPIVACAINE HCL (PF) 0.5 % IJ SOLN
INTRAMUSCULAR | Status: AC
  Filled 2016-02-01: qty 30

## 2016-02-01 MED ORDER — PROPOFOL 10 MG/ML IV BOLUS
INTRAVENOUS | Status: DC | PRN
  Administered 2016-02-01: 100 mg via INTRAVENOUS

## 2016-02-01 MED ORDER — OXYCODONE HCL 5 MG PO TABS: 5.0000 mg | ORAL_TABLET | Freq: Once | ORAL | Status: DC | PRN

## 2016-02-01 MED ORDER — HYDROCODONE-ACETAMINOPHEN 5-325 MG PO TABS
1.0000 | ORAL_TABLET | Freq: Four times a day (QID) | ORAL | 0 refills | Status: DC | PRN
Start: 2016-02-01 — End: 2016-02-23

## 2016-02-01 MED ORDER — DEXAMETHASONE SODIUM PHOSPHATE 10 MG/ML IJ SOLN
INTRAMUSCULAR | Status: AC
  Filled 2016-02-01: qty 1

## 2016-02-01 MED ORDER — SUCCINYLCHOLINE CHLORIDE 200 MG/10ML IV SOSY
PREFILLED_SYRINGE | INTRAVENOUS | Status: DC | PRN
  Administered 2016-02-01: 160 mg via INTRAVENOUS

## 2016-02-01 MED ORDER — HYDROMORPHONE HCL 1 MG/ML IJ SOLN: 0.2500 mg | INTRAMUSCULAR | Status: DC | PRN

## 2016-02-01 MED ORDER — ACETAMINOPHEN 500 MG PO TABS: 1000.0000 mg | ORAL_TABLET | ORAL | Status: DC

## 2016-02-01 MED ORDER — SUFENTANIL CITRATE 50 MCG/ML IV SOLN
INTRAVENOUS | Status: AC
  Filled 2016-02-01: qty 1

## 2016-02-01 MED ORDER — LIDOCAINE 2% (20 MG/ML) 5 ML SYRINGE
INTRAMUSCULAR | Status: AC
  Filled 2016-02-01: qty 5

## 2016-02-01 MED ORDER — BUPIVACAINE HCL (PF) 0.5 % IJ SOLN
INTRAMUSCULAR | Status: DC | PRN
  Administered 2016-02-01: 20 mL

## 2016-02-01 MED ORDER — ACETAMINOPHEN 500 MG PO TABS
1000.0000 mg | ORAL_TABLET | ORAL | Status: AC
  Administered 2016-02-01: 1000 mg via ORAL
  Filled 2016-02-01: qty 2

## 2016-02-01 MED ORDER — ROCURONIUM BROMIDE 50 MG/5ML IV SOSY
PREFILLED_SYRINGE | INTRAVENOUS | Status: AC
  Filled 2016-02-01: qty 5

## 2016-02-01 MED ORDER — GABAPENTIN 300 MG PO CAPS
300.0000 mg | ORAL_CAPSULE | ORAL | Status: AC
  Administered 2016-02-01: 300 mg via ORAL
  Filled 2016-02-01: qty 1

## 2016-02-01 MED ORDER — CEFAZOLIN SODIUM-DEXTROSE 2-4 GM/100ML-% IV SOLN
INTRAVENOUS | Status: AC
  Filled 2016-02-01: qty 100

## 2016-02-01 MED ORDER — KETAMINE HCL 10 MG/ML IJ SOLN
INTRAMUSCULAR | Status: AC
  Filled 2016-02-01: qty 1

## 2016-02-01 MED ORDER — DEXAMETHASONE SODIUM PHOSPHATE 10 MG/ML IJ SOLN
INTRAMUSCULAR | Status: DC | PRN
  Administered 2016-02-01: 10 mg via INTRAVENOUS

## 2016-02-01 MED ORDER — IBUPROFEN 800 MG PO TABS: 800.0000 mg | ORAL_TABLET | Freq: Three times a day (TID) | ORAL | 0 refills | Status: DC | PRN

## 2016-02-01 MED ORDER — SUCCINYLCHOLINE CHLORIDE 200 MG/10ML IV SOSY
PREFILLED_SYRINGE | INTRAVENOUS | Status: AC
  Filled 2016-02-01: qty 10

## 2016-02-01 MED ORDER — CHLORHEXIDINE GLUCONATE CLOTH 2 % EX PADS: 6.0000 | MEDICATED_PAD | Freq: Once | CUTANEOUS | Status: DC

## 2016-02-01 MED ORDER — LIDOCAINE 2% (20 MG/ML) 5 ML SYRINGE
INTRAMUSCULAR | Status: DC | PRN
  Administered 2016-02-01: 100 mg via INTRAVENOUS

## 2016-02-01 MED ORDER — LACTATED RINGERS IV SOLN
INTRAVENOUS | Status: DC
  Administered 2016-02-01: 1000 mL via INTRAVENOUS
  Administered 2016-02-01: 12:00:00 via INTRAVENOUS

## 2016-02-01 MED ORDER — PROPOFOL 10 MG/ML IV BOLUS
INTRAVENOUS | Status: AC
  Filled 2016-02-01: qty 20

## 2016-02-01 MED ORDER — BUPIVACAINE LIPOSOME 1.3 % IJ SUSP
20.0000 mL | Freq: Once | INTRAMUSCULAR | Status: AC
  Administered 2016-02-01: 20 mL
  Filled 2016-02-01: qty 20

## 2016-02-01 MED ORDER — SUGAMMADEX SODIUM 200 MG/2ML IV SOLN
INTRAVENOUS | Status: AC
  Filled 2016-02-01: qty 2

## 2016-02-01 MED ORDER — CEFAZOLIN SODIUM-DEXTROSE 2-4 GM/100ML-% IV SOLN
2.0000 g | INTRAVENOUS | Status: DC
  Filled 2016-02-01: qty 100

## 2016-02-01 MED ORDER — ONDANSETRON HCL 4 MG/2ML IJ SOLN: 4.0000 mg | Freq: Four times a day (QID) | INTRAMUSCULAR | Status: DC | PRN

## 2016-02-01 MED ORDER — MIDAZOLAM HCL 2 MG/2ML IJ SOLN
INTRAMUSCULAR | Status: DC | PRN
  Administered 2016-02-01: 2 mg via INTRAVENOUS

## 2016-02-01 MED ORDER — CELECOXIB 200 MG PO CAPS
400.0000 mg | ORAL_CAPSULE | ORAL | Status: AC
  Administered 2016-02-01: 400 mg via ORAL
  Filled 2016-02-01: qty 2

## 2016-02-01 SURGICAL SUPPLY — 37 items
APPLIER CLIP 5 13 M/L LIGAMAX5 (MISCELLANEOUS)
BINDER ABDOMINAL 12 ML 46-62 (SOFTGOODS) IMPLANT
CABLE HIGH FREQUENCY MONO STRZ (ELECTRODE) ×3 IMPLANT
CHLORAPREP W/TINT 26ML (MISCELLANEOUS) ×3 IMPLANT
CLIP APPLIE 5 13 M/L LIGAMAX5 (MISCELLANEOUS) IMPLANT
COVER SURGICAL LIGHT HANDLE (MISCELLANEOUS) ×3 IMPLANT
DECANTER SPIKE VIAL GLASS SM (MISCELLANEOUS) ×3 IMPLANT
DERMABOND ADVANCED (GAUZE/BANDAGES/DRESSINGS) ×2
DERMABOND ADVANCED .7 DNX12 (GAUZE/BANDAGES/DRESSINGS) ×1 IMPLANT
DEVICE SECURE STRAP 25 ABSORB (INSTRUMENTS) ×3 IMPLANT
DRAIN CHANNEL 19F RND (DRAIN) IMPLANT
ELECT REM PT RETURN 9FT ADLT (ELECTROSURGICAL) ×3
ELECTRODE REM PT RTRN 9FT ADLT (ELECTROSURGICAL) ×1 IMPLANT
EVACUATOR SILICONE 100CC (DRAIN) IMPLANT
GLOVE BIOGEL PI IND STRL 7.0 (GLOVE) ×1 IMPLANT
GLOVE BIOGEL PI INDICATOR 7.0 (GLOVE) ×2
GLOVE SURG SS PI 7.0 STRL IVOR (GLOVE) ×3 IMPLANT
GOWN STRL REUS W/TWL LRG LVL3 (GOWN DISPOSABLE) ×3 IMPLANT
GOWN STRL REUS W/TWL XL LVL3 (GOWN DISPOSABLE) ×6 IMPLANT
GRASPER SUT TROCAR 14GX15 (MISCELLANEOUS) ×3 IMPLANT
IRRIG SUCT STRYKERFLOW 2 WTIP (MISCELLANEOUS) ×3
IRRIGATION SUCT STRKRFLW 2 WTP (MISCELLANEOUS) ×1 IMPLANT
KIT BASIN OR (CUSTOM PROCEDURE TRAY) ×3 IMPLANT
MESH VENT LT ST 15.2CM CRL (Mesh General) ×3 IMPLANT
SCISSORS LAP 5X35 DISP (ENDOMECHANICALS) ×3 IMPLANT
SHEARS HARMONIC ACE PLUS 36CM (ENDOMECHANICALS) IMPLANT
SLEEVE XCEL OPT CAN 5 100 (ENDOMECHANICALS) ×3 IMPLANT
SUT ETHILON 2 0 PS N (SUTURE) IMPLANT
SUT MNCRL AB 4-0 PS2 18 (SUTURE) ×3 IMPLANT
SUT NOVA NAB GS-21 0 18 T12 DT (SUTURE) ×6 IMPLANT
SUT PDS AB 0 CT 36 (SUTURE) IMPLANT
SUT VICRYL 0 UR6 27IN ABS (SUTURE) IMPLANT
TOWEL OR 17X26 10 PK STRL BLUE (TOWEL DISPOSABLE) ×3 IMPLANT
TRAY LAPAROSCOPIC (CUSTOM PROCEDURE TRAY) ×3 IMPLANT
TROCAR BLADELESS OPT 5 100 (ENDOMECHANICALS) ×3 IMPLANT
TROCAR XCEL 12X100 BLDLESS (ENDOMECHANICALS) ×3 IMPLANT
TUBING INSUF HEATED (TUBING) ×3 IMPLANT

## 2016-02-01 NOTE — Progress Notes (Signed)
Nine abdominal puncture sites intact with glue.  Pt's wife verbalizes understanding of discharge instructions.

## 2016-02-01 NOTE — Anesthesia Preprocedure Evaluation (Signed)
Anesthesia Evaluation  Patient identified by MRN, date of birth, ID band Patient awake    Reviewed: Allergy & Precautions, H&P , NPO status , Patient's Chart, lab work & pertinent test results  Airway Mallampati: II   Neck ROM: full    Dental   Pulmonary neg pulmonary ROS,    breath sounds clear to auscultation       Cardiovascular hypertension,  Rhythm:regular Rate:Normal     Neuro/Psych    GI/Hepatic   Endo/Other  diabetes, Type 2  Renal/GU      Musculoskeletal   Abdominal   Peds  Hematology   Anesthesia Other Findings   Reproductive/Obstetrics                             Anesthesia Physical Anesthesia Plan  ASA: II  Anesthesia Plan: General   Post-op Pain Management:    Induction: Intravenous  Airway Management Planned: Oral ETT  Additional Equipment:   Intra-op Plan:   Post-operative Plan: Extubation in OR  Informed Consent: I have reviewed the patients History and Physical, chart, labs and discussed the procedure including the risks, benefits and alternatives for the proposed anesthesia with the patient or authorized representative who has indicated his/her understanding and acceptance.     Plan Discussed with: CRNA, Anesthesiologist and Surgeon  Anesthesia Plan Comments:         Anesthesia Quick Evaluation

## 2016-02-01 NOTE — Transfer of Care (Signed)
Immediate Anesthesia Transfer of Care Note  Patient: Jeffrey Austin  Procedure(s) Performed: Procedure(s): [place] (N/A) INSERTION OF MESH (N/A)  Patient Location: PACU  Anesthesia Type:General  Level of Consciousness: sedated  Airway & Oxygen Therapy: Patient Spontanous Breathing and Patient connected to face mask oxygen  Post-op Assessment: Report given to RN and Post -op Vital signs reviewed and stable  Post vital signs: Reviewed and stable  Last Vitals:  Vitals:   02/01/16 0829  BP: (!) 134/91  Pulse: (!) 103  Resp: 18  Temp: 36.8 C    Last Pain:  Vitals:   02/01/16 1021  TempSrc:   PainSc: 4       Patients Stated Pain Goal: 5 ([id number])  Complications: No apparent anesthesia complications

## 2016-02-01 NOTE — Anesthesia Postprocedure Evaluation (Signed)
Anesthesia Post Note  Patient: Jeffrey Austin  Procedure(s) Performed: Procedure(s) (LRB): LAPAROSCOPIC INCISIONAL HERNIA REPAIR (N/A) INSERTION OF MESH (N/A)  Patient location during evaluation: PACU Anesthesia Type: General Level of consciousness: awake and alert and patient cooperative Pain management: pain level controlled Vital Signs Assessment: post-procedure vital signs reviewed and stable Respiratory status: spontaneous breathing and respiratory function stable Cardiovascular status: stable Anesthetic complications: no    Last Vitals:  Vitals:   02/01/16 1230 02/01/16 1245  BP: (!) 136/95 (!) 135/94  Pulse: (!) 102 95  Resp: 15 13  Temp: 36.9 C     Last Pain:  Vitals:   02/01/16 1021  TempSrc:   PainSc: [place]

## 2016-02-01 NOTE — Discharge Instructions (Signed)
Laparoscopic  Hernia Repair, Care After This sheet gives you information about how to care for yourself after your procedure. Your health care provider may also give you more specific instructions. If you have problems or questions, contact your health care provider. What can I expect after the procedure? After the procedure, it is common to have:  Pain, discomfort, or soreness. Follow these instructions at home: Incision care  Follow instructions from your health care provider about how to take care of your incision. Make sure you:  Wash your hands with soap and water before you change your bandage (dressing) or before you touch your abdomen. If soap and water are not available, use hand sanitizer.  Change your dressing as told by your health care provider.  Leave stitches (sutures), skin glue, or adhesive strips in place. These skin closures may need to stay in place for 2 weeks or longer. If adhesive strip edges start to loosen and curl up, you may trim the loose edges. Do not remove adhesive strips completely unless your health care provider tells you to do that.  Check your incision area every day for signs of infection. Check for:  Redness, swelling, or pain.  Fluid or blood.  Warmth.  Pus or a bad smell. Bathing  Do not take baths, swim, or use a hot tub until your health care provider approves. Ask your health care provider if you can take showers. You may only be allowed to take sponge baths for bathing.  Keep your bandage (dressing) dry until your health care provider says it can be removed. Activity  Do not lift anything that is heavier than 10 lb (4.5 kg) until your health care provider approves.  Do not drive or use heavy machinery while taking prescription pain medicine. Ask your health care provider when it is safe for you to drive or use heavy machinery.  Do not drive for 24 hours if you were given a medicine to help you relax (sedative) during your  procedure.  Rest as told by your health care provider. You may return to your normal activities when your health care provider approves. General instructions  Take over-the-counter and prescription medicines only as told by your health care provider.  To prevent or treat constipation while you are taking prescription pain medicine, your health care provider may recommend that you:  Take over-the-counter or prescription medicines.  Eat foods that are high in fiber, such as fresh fruits and vegetables, whole grains, and beans.  Limit foods that are high in fat and processed sugars, such as fried and sweet foods.  Drink enough fluid to keep your urine clear or pale yellow.  Hold a pillow over your abdomen when you cough or sneeze. This helps with pain.  Keep all follow-up visits as told by your health care provider. This is important. Contact a health care provider if:  You have:  A fever or chills.  Redness, swelling, or pain around your incision.  Fluid or blood coming from your incision.  Pus or a bad smell coming from your incision.  Pain that gets worse or does not get better with medicine.  Nausea or vomiting.  A cough.  Shortness of breath.  Your incision feels warm to the touch.  You have not had a bowel movement in three days.  You are not able to urinate. Get help right away if:  You have severe pain in your abdomen.  You have persistent nausea and vomiting.  You have redness, warmth,  or pain in your leg.  You have chest pain.  You have trouble breathing. Summary  After this procedure, it is common to have pain, discomfort, or soreness.  Follow instructions from your health care provider about how to take care of your incision.  Check your incision area every day for signs of infection. Report any signs of infection to your health care provider.  Keep all follow-up visits as told by your health care provider. This is important. This information  is not intended to replace advice given to you by your health care provider. Make sure you discuss any questions you have with your health care provider. Document Released: 02/06/2012 Document Revised: 10/12/2015 Document Reviewed: 10/12/2015 Elsevier Interactive Patient Education  2017 [place] Anesthesia, Adult, Care After These instructions provide you with information about caring for yourself after your procedure. Your health care provider may also give you more specific instructions. Your treatment has been planned according to current medical practices, but problems sometimes occur. Call your health care provider if you have any problems or questions after your procedure. What can I expect after the procedure? After the procedure, it is common to have:  Vomiting.  A sore throat.  Mental slowness. It is common to feel:  Nauseous.  Cold or shivery.  Sleepy.  Tired.  Sore or achy, even in parts of your body where you did not have surgery. Follow these instructions at home: For at least 24 hours after the procedure:  Do not:  Participate in activities where you could fall or become injured.  Drive.  Use heavy machinery.  Drink alcohol.  Take sleeping pills or medicines that cause drowsiness.  Make important decisions or sign legal documents.  Take care of children on your own.  Rest. Eating and drinking  If you vomit, drink water, juice, or soup when you can drink without vomiting.  Drink enough fluid to keep your urine clear or pale yellow.  Make sure you have little or no nausea before eating solid foods.  Follow the diet recommended by your health care provider. General instructions  Have a responsible adult stay with you until you are awake and alert.  Return to your normal activities as told by your health care provider. Ask your health care provider what activities are safe for you.  Take over-the-counter and prescription medicines  only as told by your health care provider.  If you smoke, do not smoke without supervision.  Keep all follow-up visits as told by your health care provider. This is important. Contact a health care provider if:  You continue to have nausea or vomiting at home, and medicines are not helpful.  You cannot drink fluids or start eating again.  You cannot urinate after 8-12 hours.  You develop a skin rash.  You have fever.  You have increasing redness at the site of your procedure. Get help right away if:  You have difficulty breathing.  You have chest pain.  You have unexpected bleeding.  You feel that you are having a life-threatening or urgent problem. This information is not intended to replace advice given to you by your health care provider. Make sure you discuss any questions you have with your health care provider. Document Released: 05/28/2000 Document Revised: 07/25/2015 Document Reviewed: 02/03/2015 Elsevier Interactive Patient Education  2017 [employer].

## 2016-02-01 NOTE — H&P (Signed)
Jeffrey Austin is an 55 y.o. male.   Chief Complaint: incisional hernia HPI: 55 yo male with incision from previous anterior approach for back surgery. He has occasional soreness and dull ache over his abdomen. He denies nausea or vomiting  Past Medical History:  Diagnosis Date  . Back pain   . Diabetes mellitus without complication ([place])   . Hypertension     Past Surgical History:  Procedure Laterality Date  . cyst removed from testicle     . LUMBAR FUSION    . SPINAL FUSION      Family History  Problem Relation Age of Onset  . Leukemia Father    Social History:  reports that he has never smoked. He has never used smokeless tobacco. He reports that he drinks alcohol. He reports that he does not use drugs.  Allergies: No Known Allergies  Medications Prior to Admission  Medication Sig Dispense Refill  . amLODipine (NORVASC) 10 MG tablet Take 10 mg by mouth daily.    . metFORMIN (GLUCOPHAGE) 1000 MG tablet Take 1 tablet (1,000 mg total) by mouth 2 (two) times daily with a meal. 180 tablet 3  . oxyCODONE-acetaminophen (PERCOCET) 10-325 MG tablet 1 tab po bid 60 tablet 0  . amoxicillin-clavulanate (AUGMENTIN) 875-125 MG tablet Take 1 tablet by mouth 2 (two) times daily. Prevention for dental procedure for mandibular tori      Results for orders placed or performed during the hospital encounter of 02/01/16 (from the past 48 hour(s))  Glucose, capillary     Status: Abnormal   Collection Time: 02/01/16  8:25 AM  Result Value Ref Range   Glucose-Capillary 223 (H) 65 - 99 mg/dL   Comment 1 Notify RN    No results found.  Review of Systems  Constitutional: Negative for chills and fever.  HENT: Negative for hearing loss.   Eyes: Negative for blurred vision and double vision.  Respiratory: Negative for cough and hemoptysis.   Cardiovascular: Negative for chest pain and palpitations.  Gastrointestinal: Negative for abdominal pain, nausea and vomiting.  Genitourinary: Negative  for dysuria and urgency.  Musculoskeletal: Negative for myalgias and neck pain.  Skin: Negative for itching and rash.  Neurological: Negative for dizziness, tingling and headaches.  Endo/Heme/Allergies: Does not bruise/bleed easily.  Psychiatric/Behavioral: Negative for depression and suicidal ideas.    Blood pressure (!) 134/91, pulse (!) 103, temperature 98.3 F (36.8 C), temperature source Oral, resp. rate 18, height 5\' 10"  (1.778 m), weight 94.2 kg (207 lb 12 oz), SpO2 98 %. Physical Exam  Vitals reviewed. Constitutional: He is oriented to person, place, and time. He appears well-developed and well-nourished.  HENT:  Head: Normocephalic and atraumatic.  Eyes: Conjunctivae and EOM are normal. Pupils are equal, round, and reactive to light.  Neck: Normal range of motion. Neck supple.  Cardiovascular: Normal rate and regular rhythm.   Respiratory: Effort normal and breath sounds normal.  GI: Soft. Bowel sounds are normal. He exhibits no distension. There is no tenderness.  4cm periumbilical incisional hernia  Musculoskeletal: Normal range of motion.  Neurological: He is alert and oriented to person, place, and time.  Skin: Skin is warm and dry.  Psychiatric: He has a normal mood and affect. His behavior is normal.     Assessment/Plan 55 yo male with incisional hernia and diabetes. -lap incisional hernia  Mickeal Skinner, MD 02/01/2016, 10:01 AM

## 2016-02-01 NOTE — Op Note (Signed)
Preoperative diagnosis: incisional hernia without obstruction or gangrene  Postoperative diagnosis: Same   Procedure: laparoscopic incisional hernia repair with mesh  Surgeon: Gurney Maxin, M.D.  Asst: none  Anesthesia: Gen.   Indications for procedure: Jeffrey Austin is a 55 y.o. male with symptoms of abdominal pain and hernia reducible on exam. Hernia associated with an anterior approach of back surgery. It causes occasional dull pains.  Description of procedure: The patient was brought into the operative suite, placed supine. Anesthesia was administered with endotracheal tube. Patient was strapped in place and foot board was secured. Both arms were tucked. All pressure points were offloaded by foam padding. Foley was placed in sterile fashion. The patient was prepped and draped in the usual sterile fashion.  A small incision was made over the left subcostal area and 53mm trocar was placed with optical entry. Pneumoperitoneum was applied with high flow low pressure. The abdominal cavity was inspected and contents of the hernia spontaneously reduced. 1 75mm trocar was placed in the mid left abdomen and 3 additional 61mm trocars 1 in the LLQ. All trocar sites were first anesthestized with 0.25% marcaine with epi and trocars were placed under direct vision.  Blunt dissection was used to remove most of the filmy adhesions with occasional sharp dissection. Cautery was used to provide hemostasis. The hernia was identified and already reduced. The defect was about 4 cm in diamteter. A 15 x 15cm ventralight mesh was inserted and used to the repair the mesh. 6 transfascial 0 prolene sutures were used to secure the mesh in place and absorbable tackers were used to appose the mesh against the abdominal wall in all areas.  The abdominal contents were again inspected and hemostasis was intact.  0 vicryl was used to close the fascial defect of the 86mm trocar site using suture passer. Pneumoperitoneum  was removed, all trocar were removed. All incisions were closed with 4-0 monocryl subcuticular stitch. The patient woke from anesthesia and was brought to PACU in stable condition.  Findings: 4cm hernia  Specimen: none  Blood loss: <30 ml  Local anesthesia: 40 ml 1:1 Exparel:Saline  Complications: none  Implant: 15cm round ventralight ST mesh  Gurney Maxin, M.D. General, Bariatric, & Minimally Invasive Surgery [facility]

## 2016-02-01 NOTE — Anesthesia Procedure Notes (Signed)
Procedure Name: Intubation Date/Time: 02/01/2016 11:06 AM Performed by: Danley Danker L Patient Re-evaluated:Patient Re-evaluated prior to inductionOxygen Delivery Method: Circle system utilized Preoxygenation: Pre-oxygenation with 100% oxygen Intubation Type: IV induction Ventilation: Mask ventilation without difficulty and Oral airway inserted - appropriate to patient size Laryngoscope Size: Miller and 3 Grade View: Grade II Tube type: Oral Tube size: 8.0 mm Number of attempts: 1 Airway Equipment and Method: Stylet Placement Confirmation: ETT inserted through vocal cords under direct vision,  positive ETCO2 and breath sounds checked- equal and bilateral Secured at: 22 cm Tube secured with: Tape Dental Injury: Teeth and Oropharynx as per pre-operative assessment

## 2016-02-02 ENCOUNTER — Encounter (HOSPITAL_COMMUNITY): Payer: Self-pay | Admitting: General Surgery

## 2016-02-06 ENCOUNTER — Telehealth: Payer: Self-pay | Admitting: Medical

## 2016-02-06 NOTE — Telephone Encounter (Addendum)
Disability insurance company wants our records on pt regarding office visits. He also sees a specialist. If you could make copies of office notes related to his back pain. They want records by today. As you know I was out since Thursday last week. Can you help and get these records to them today. Did pt sign on [place] sending these records. I am placing this on your keyboard. Thanks.   Did you see this on your keyboard. Can you fax over records. And can you call company and explain delay. Will you give me update.

## 2016-02-07 NOTE — Telephone Encounter (Signed)
Hilary Hertz,  Yes  I faxed these over yesterday to the insurance company and sent  Copy to medical records as well. This is complete I apologize for not updating you yesterday. There was only one office visit for his back pain after July 1. So I faxed that note over.

## 2016-02-21 ENCOUNTER — Emergency Department (HOSPITAL_BASED_OUTPATIENT_CLINIC_OR_DEPARTMENT_OTHER)
Admission: EM | Admit: 2016-02-21 | Discharge: 2016-02-21 | Disposition: A | Discharge status: Home / Self Care | Payer: BLUE CROSS/BLUE SHIELD | Attending: Emergency Medicine | Admitting: Emergency Medicine

## 2016-02-21 ENCOUNTER — Telehealth: Payer: Self-pay | Admitting: Medical

## 2016-02-21 ENCOUNTER — Encounter (HOSPITAL_BASED_OUTPATIENT_CLINIC_OR_DEPARTMENT_OTHER): Payer: Self-pay | Admitting: *Deleted

## 2016-02-21 DIAGNOSIS — R000 Tachycardia, unspecified: Secondary | ICD-10-CM | POA: Insufficient documentation

## 2016-02-21 DIAGNOSIS — S29012A Strain of muscle and tendon of back wall of thorax, initial encounter: Secondary | ICD-10-CM | POA: Diagnosis not present

## 2016-02-21 DIAGNOSIS — Y929 Unspecified place or not applicable: Secondary | ICD-10-CM | POA: Diagnosis not present

## 2016-02-21 DIAGNOSIS — E119 Type 2 diabetes mellitus without complications: Secondary | ICD-10-CM | POA: Diagnosis not present

## 2016-02-21 DIAGNOSIS — X501XXA Overexertion from prolonged static or awkward postures, initial encounter: Secondary | ICD-10-CM | POA: Insufficient documentation

## 2016-02-21 DIAGNOSIS — Y999 Unspecified external cause status: Secondary | ICD-10-CM | POA: Diagnosis not present

## 2016-02-21 DIAGNOSIS — T148XXA Other injury of unspecified body region, initial encounter: Secondary | ICD-10-CM

## 2016-02-21 DIAGNOSIS — S3992XA Unspecified injury of lower back, initial encounter: Secondary | ICD-10-CM | POA: Diagnosis present

## 2016-02-21 DIAGNOSIS — Y9389 Activity, other specified: Secondary | ICD-10-CM | POA: Insufficient documentation

## 2016-02-21 DIAGNOSIS — Z7984 Long term (current) use of oral hypoglycemic drugs: Secondary | ICD-10-CM | POA: Insufficient documentation

## 2016-02-21 DIAGNOSIS — Z79899 Other long term (current) drug therapy: Secondary | ICD-10-CM | POA: Insufficient documentation

## 2016-02-21 DIAGNOSIS — I10 Essential (primary) hypertension: Secondary | ICD-10-CM | POA: Diagnosis not present

## 2016-02-21 MED ORDER — DIAZEPAM 5 MG PO TABS
10.0000 mg | ORAL_TABLET | Freq: Once | ORAL | Status: AC
  Administered 2016-02-21: 10 mg via ORAL
  Filled 2016-02-21: qty 2

## 2016-02-21 MED ORDER — DIAZEPAM 10 MG PO TABS: 10.0000 mg | ORAL_TABLET | Freq: Three times a day (TID) | ORAL | 0 refills | Status: DC | PRN

## 2016-02-21 MED ORDER — KETOROLAC TROMETHAMINE 60 MG/2ML IM SOLN
60.0000 mg | Freq: Once | INTRAMUSCULAR | Status: AC
  Administered 2016-02-21: 60 mg via INTRAMUSCULAR
  Filled 2016-02-21: qty 2

## 2016-02-21 MED ORDER — HYDROMORPHONE HCL 1 MG/ML IJ SOLN
2.0000 mg | Freq: Once | INTRAMUSCULAR | Status: AC
  Administered 2016-02-21: 2 mg via INTRAMUSCULAR
  Filled 2016-02-21: qty 2

## 2016-02-21 MED FILL — diazePAM 10 MG TABS: 10 | 6 days supply | Qty: 20 | Fill #0

## 2016-02-21 NOTE — ED Notes (Signed)
Pt verbalized understanding of discharge instructions and denies any further questions at this time.   

## 2016-02-21 NOTE — ED Provider Notes (Signed)
[facility] Provider Note   CSN: [id number] Arrival date & time: 02/21/16  1120     History   Chief Complaint Chief Complaint  Patient presents with  . Back Pain    HPI Jeffrey Austin is a 55 y.o. male.  Patient is a 55 year old male with a history of hypertension, diabetes, chronic back pain status post lumbar fusion presenting today with new onset thoracic back pain that started 2 days ago. Patient states that he was getting out of bed to go to the bathroom in his feet got tangled in the covers and he tripped causing him to twist but he did not fall to the ground. Since that time he's had a severe pain in his left side. It does not seem to be affected with deep breathing and he does not feel short of breath. He denies any radiation of the pain but is not improved with Motrin and oxycodone. He denies any weakness, bowel or bladder incontinence. Patient did have surgery less than a month ago for repair of a ventral hernia but that seems to be doing well and he has complaints in that regard.   The history is provided by the patient.  Back Pain   This is a new problem. The current episode started 2 days ago. The problem occurs constantly. The problem has been gradually worsening. The pain is associated with twisting. The pain is present in the thoracic spine. The quality of the pain is described as shooting. The pain does not radiate. The pain is at a severity of 10/10. The pain is severe. The symptoms are aggravated by twisting and certain positions. The pain is worse during the day. Pertinent negatives include no chest pain, no fever, no numbness, no abdominal swelling, no bowel incontinence, no bladder incontinence, no dysuria, no tingling and no weakness. He has tried NSAIDs for the symptoms. The treatment provided no relief. Risk factors: hx of back surgery and chronic pain.    Past Medical History:  Diagnosis Date  . Back pain   . Diabetes mellitus without  complication ([place])   . Hypertension     There are no active problems to display for this patient.   Past Surgical History:  Procedure Laterality Date  . cyst removed from testicle     . INCISIONAL HERNIA REPAIR N/A 02/01/2016   Procedure: LAPAROSCOPIC INCISIONAL HERNIA REPAIR;  Surgeon: Arta Bruce Kinsinger, MD;  Location: [facility];  Service: General;  Laterality: N/A;  . INSERTION OF MESH N/A 02/01/2016   Procedure: INSERTION OF MESH;  Surgeon: Arta Bruce Kinsinger, MD;  Location: [facility] ORS;  Service: General;  Laterality: N/A;  . LUMBAR FUSION    . SPINAL FUSION         Home Medications    Prior to Admission medications   Medication Sig Start Date End Date Taking? Authorizing Provider  amLODipine (NORVASC) 10 MG tablet Take 10 mg by mouth daily.    Historical Provider, MD  amoxicillin-clavulanate (AUGMENTIN) 875-125 MG tablet Take 1 tablet by mouth 2 (two) times daily. Prevention for dental procedure for mandibular tori    Historical Provider, MD  HYDROcodone-acetaminophen (NORCO/VICODIN) 5-325 MG tablet Take 1-2 tablets by mouth every 6 (six) hours as needed for moderate pain. 02/01/16   Mickeal Skinner, MD  ibuprofen (ADVIL,MOTRIN) 800 MG tablet Take 1 tablet (800 mg total) by mouth every 8 (eight) hours as needed. 02/01/16   Mickeal Skinner, MD  metFORMIN (GLUCOPHAGE) 1000 MG tablet Take 1 tablet (  1,000 mg total) by mouth 2 (two) times daily with a meal. 12/09/15   Mackie Pai, PA-C  oxyCODONE-acetaminophen (PERCOCET) 10-325 MG tablet 1 tab po bid 01/30/16   Mackie Pai, PA-C    Family History Family History  Problem Relation Age of Onset  . Leukemia Father     Social History Social History  Substance Use Topics  . Smoking status: Never Smoker  . Smokeless tobacco: Never Used  . Alcohol use 0.0 oz/week     Comment: 1-2 beers a week     Allergies   Patient has no known allergies.   Review of Systems Review of Systems  Constitutional: Negative for  fever.  Cardiovascular: Negative for chest pain.  Gastrointestinal: Negative for bowel incontinence.  Genitourinary: Negative for bladder incontinence and dysuria.  Musculoskeletal: Positive for back pain.  Neurological: Negative for tingling, weakness and numbness.     Physical Exam Updated Vital Signs BP 146/88   Pulse (!) 130 Comment: pt standing, unable to stand still.   Temp 98.7 F (37.1 C) (Oral)   Resp 18   Ht 5\' 10"  (1.778 m)   Wt 200 lb (90.7 kg)   SpO2 98%   BMI 28.70 kg/m   Physical Exam  Constitutional: He is oriented to person, place, and time. He appears well-developed and well-nourished. He appears distressed.  Appears uncomfortable and intermittently will grab his left side in pain  HENT:  Head: Normocephalic and atraumatic.  Mouth/Throat: Oropharynx is clear and moist.  Eyes: Conjunctivae and EOM are normal. Pupils are equal, round, and reactive to light.  Neck: Normal range of motion. Neck supple.  Cardiovascular: Regular rhythm and intact distal pulses.  Tachycardia present.   No murmur heard. Pulmonary/Chest: Effort normal and breath sounds normal. No respiratory distress. He has no wheezes. He has no rales.  Abdominal: Soft. He exhibits no distension. There is no tenderness. There is no rebound and no guarding.  Well healed surgical incision sites.  Non-tender  Musculoskeletal: Normal range of motion. He exhibits tenderness. He exhibits no edema.       Thoracic back: He exhibits tenderness, pain and spasm.       Back:  Neurological: He is alert and oriented to person, place, and time.  Skin: Skin is warm and dry. No rash noted. No erythema.  Psychiatric: He has a normal mood and affect. His behavior is normal.  Nursing note and vitals reviewed.    ED Treatments / Results  Labs (all labs ordered are listed, but only abnormal results are displayed) Labs Reviewed - No data to display  EKG  EKG Interpretation None       Radiology No results  found.  Procedures Procedures (including critical care time)  Medications Ordered in ED Medications  ketorolac (TORADOL) injection 60 mg (60 mg Intramuscular Given 02/21/16 1152)  diazepam (VALIUM) tablet 10 mg (10 mg Oral Given 02/21/16 1151)  HYDROmorphone (DILAUDID) injection 2 mg (2 mg Intramuscular Given 02/21/16 1244)     Initial Impression / Assessment and Plan / ED Course  I have reviewed the triage vital signs and the nursing notes.  Pertinent labs & imaging results that were available during my care of the patient were reviewed by me and considered in my medical decision making (see chart for details).  Clinical Course    Patient is a 55 year old male presenting today with left-sided back injury from a severe twisting injury when getting his feet tangled in the covers and almost falling to the floor.  Pain is severe with certain movements and will catch. Patient does take oxycodone chronically for back pain which he has continued to take with Motrin without improvement in symptoms. He did take a muscle relaxer yesterday which is over 61 years old without significant improvement. He denies any respiratory issues. He recently had abdominal surgery but has no abdominal complaints and surgical sites are well-healed. He does have a large acute moderate area in the left flank region which is worse with palpation. He denies any urinary symptoms or hematuria.  He appears very uncomfortable and was treated with Toradol and Valium. Will recheck.  Pt was still having pain but better after dilaudid. Feel most likely musculoskeletal in nature. He was also tachycardic upon arrival here. Most likely related to pain but will recheck. Vitals:   02/21/16 1129 02/21/16 1244  BP: 146/88 121/88  Pulse: (!) 130 (!) 130  Resp: 18 17  Temp: 98.7 F (37.1 C)    Repeat VS showed Hr improved to 119 Final Clinical Impressions(s) / ED Diagnoses   Final diagnoses:  Muscle strain    New  Prescriptions New Prescriptions   DIAZEPAM (VALIUM) 10 MG TABLET    Take 1 tablet (10 mg total) by mouth every 8 (eight) hours as needed for muscle spasms.     Blanchie Dessert, MD 02/21/16 1345

## 2016-02-21 NOTE — ED Triage Notes (Signed)
Left sided back spasms since Saturday.

## 2016-02-21 NOTE — Telephone Encounter (Signed)
Refill request for Oxycodone-APAP 10-325mg  Last filled by MD on - 01/30/16, #[id number] [P/U and signed for by patient on 01/30/16] Last AEX - 12/09/15 Next AEX - 3-Mths. Please Advise on refills [early request]/SLS 12/19

## 2016-02-21 NOTE — Telephone Encounter (Signed)
Pt is early for the prescription. He went to ED for muscle strain type injury. Pt I believe also had back pain surgery recently if not mistaken.  Need to see him in office on Thursday preferably early 8-9 or 1-2.(if late am or pm he may wait a while)  He is on contract if I am not mistaken(pretty sure of this). So need to clarify why he got rx of norco filled on 02-01-2016 which was 2 days after I filled the oxycydone.  Also the valium he just got in ED may start to work.   Also how has been taking percocet(how many times a day) since he is 8 days early from refill.   I want him to be seen on Thursday since I might need to discuss this with Dr.Blyth since being under contract, running out early etc. If he comes in on wed Dr. Charlett Blake won't be in office.  Have him not use any otc nsaid on Thursday am since may need to give him toradol IM.

## 2016-02-21 NOTE — Telephone Encounter (Signed)
°  Relation to [id number] Call back [place]:  Reason for call: pt is needing rx oxyCODONE-acetaminophen (PERCOCET) 10-325 MG tablet , please call when ready and available for pick up

## 2016-02-21 NOTE — ED Notes (Signed)
ED Provider at bedside. 

## 2016-02-22 NOTE — Telephone Encounter (Signed)
Patient scheduled for 02/22/16 at 1:30 with PCP

## 2016-02-22 NOTE — Telephone Encounter (Signed)
Tiffany Please call patient and schedule an ER follow up with Percell Miller.  In previous note, please note that Percell Miller stated he would like to see the patient in the office on Thursday preferably early 8-9a or 1-2p (if late am or pm he may wait a while).  Please remind patient not to use any over the counter nsaids on Thursday morning since he may need to give him something for pain in the office.   Thanks, Durward Fortes, RN

## 2016-02-23 ENCOUNTER — Ambulatory Visit (INDEPENDENT_AMBULATORY_CARE_PROVIDER_SITE_OTHER): Payer: Medicare Other | Admitting: Medical

## 2016-02-23 ENCOUNTER — Encounter: Payer: Self-pay | Admitting: Medical

## 2016-02-23 VITALS — BP 121/77 | HR 121 | Temp 99.4°F | Resp 17 | Ht 70.0 in | Wt 209.8 lb

## 2016-02-23 DIAGNOSIS — M545 Low back pain, unspecified: Secondary | ICD-10-CM

## 2016-02-23 DIAGNOSIS — F17200 Nicotine dependence, unspecified, uncomplicated: Secondary | ICD-10-CM | POA: Diagnosis not present

## 2016-02-23 DIAGNOSIS — E118 Type 2 diabetes mellitus with unspecified complications: Secondary | ICD-10-CM | POA: Diagnosis not present

## 2016-02-23 MED ORDER — CYCLOBENZAPRINE HCL 5 MG PO TABS: ORAL_TABLET | ORAL | 0 refills | Status: DC

## 2016-02-23 MED ORDER — MELOXICAM 7.5 MG PO TABS: ORAL_TABLET | ORAL | 0 refills | Status: DC

## 2016-02-23 MED ORDER — METFORMIN HCL 1000 MG PO TABS: 1000.0000 mg | ORAL_TABLET | Freq: Two times a day (BID) | ORAL | 3 refills | Status: DC

## 2016-02-23 MED ORDER — KETOROLAC TROMETHAMINE 60 MG/2ML IM SOLN
60.0000 mg | Freq: Once | INTRAMUSCULAR | Status: AC
  Administered 2016-02-23: 60 mg via INTRAMUSCULAR

## 2016-02-23 MED ORDER — VARENICLINE TARTRATE 0.5 MG X 11 & 1 MG X 42 PO MISC: ORAL | 0 refills | Status: DC

## 2016-02-23 MED FILL — CHANTIX STARTING MONTH BOX: 0.5 MG X 11 | 28 days supply | Qty: 53 | Fill #0

## 2016-02-23 MED FILL — MELOXICAM 7.5 MG TABLET: 7.5 | 15 days supply | Qty: 30 | Fill #0

## 2016-02-23 MED FILL — metFORMIN HCL 1000 MG TABS: 1000 | 30 days supply | Qty: 60 | Fill #0

## 2016-02-23 MED FILL — CYCLOBENZAPRINE 5 MG TABLET: 5 | 10 days supply | Qty: 10 | Fill #0

## 2016-02-23 NOTE — Patient Instructions (Addendum)
For your back pain will give toradol today. Staring tomorrow afternoon 4pm can start mobic. Continue the percocet. Reminder that if any provider offers you pain med then have them contact us to discuss since you are on contract or don't accept/fill rx and call us next day.  Will rx flexeril at night for back spasms. Stop valium as directed since you state does not help.  Rx chantix today for smoking cessation. Rx advisement given.  Refilling you metformin. But schedule diabetic visit on March 11, 2015 or little later for cmp, a1c repeat.  Follow up in 2 wks or as needed  Pt can pick up his percocet next week reminded him can't give it to him early.

## 2016-02-23 NOTE — Progress Notes (Signed)
Subjective:    Patient ID: Jeffrey Austin, male    DOB: [birth year], 55 y.o.   MRN: [id number]  HPI  Pt in for follow up.  Pt recently seen in the ED. Pt got tangled up in sheets getting out of bed and felt back pop.  Pt give injection for pain and gave valium. Pt states applying heat and ice. Pain in his pack his more lower thoracic area/paralumbar area.   Pt has history of lower back pain. Pt states his specialist wants him to have stimulator to stop pain.  Pt was given norco by his general surgeon. Pt states he is not sure if he took.(advised if he did fill and find do not take. Pt agrees.)  Pt states started smoking again about one month ago. Pt is smoking about 10 cigarettes a day. Stopped 10 years ago. Pt request chantix. He denies any depression hx. Used in the past without side effects.     Review of Systems  Constitutional: Negative for chills and fatigue.  Respiratory: Negative for cough, chest tightness, shortness of breath and wheezing.   Cardiovascular: Negative for chest pain and palpitations.  Gastrointestinal: Negative for abdominal pain, constipation, diarrhea, nausea and vomiting.  Musculoskeletal: Positive for back pain. Negative for neck pain.  Skin: Negative for rash.  Neurological: Negative for dizziness and light-headedness.  Hematological: Negative for adenopathy. Does not bruise/bleed easily.  Psychiatric/Behavioral: Negative for behavioral problems and confusion.    Past Medical History:  Diagnosis Date  . Back pain   . Diabetes mellitus without complication ([place])   . Hypertension      Social History   Social History  . Marital status: Married    Spouse name: N/A  . Number of children: N/A  . Years of education: N/A   Occupational History  . Not on file.   Social History Main Topics  . Smoking status: Never Smoker  . Smokeless tobacco: Never Used  . Alcohol use 0.0 oz/week     Comment: 1-2 beers a week  . Drug use: No  . Sexual  activity: Yes   Other Topics Concern  . Not on file   Social History Narrative  . No narrative on file    Past Surgical History:  Procedure Laterality Date  . cyst removed from testicle     . INCISIONAL HERNIA REPAIR N/A 02/01/2016   Procedure: LAPAROSCOPIC INCISIONAL HERNIA REPAIR;  Surgeon: Arta Bruce Kinsinger, MD;  Location: [facility];  Service: General;  Laterality: N/A;  . INSERTION OF MESH N/A 02/01/2016   Procedure: INSERTION OF MESH;  Surgeon: Arta Bruce Kinsinger, MD;  Location: [facility];  Service: General;  Laterality: N/A;  . LUMBAR FUSION    . SPINAL FUSION      Family History  Problem Relation Age of Onset  . Leukemia Father     No Known Allergies  Current Outpatient Prescriptions on File Prior to Visit  Medication Sig Dispense Refill  . amLODipine (NORVASC) 10 MG tablet Take 10 mg by mouth daily.    Marland Kitchen amoxicillin-clavulanate (AUGMENTIN) 875-125 MG tablet Take 1 tablet by mouth 2 (two) times daily. Prevention for dental procedure for mandibular tori    . diazepam (VALIUM) 10 MG tablet Take 1 tablet (10 mg total) by mouth every 8 (eight) hours as needed for muscle spasms. 20 tablet 0  . HYDROcodone-acetaminophen (NORCO/VICODIN) 5-325 MG tablet Take 1-2 tablets by mouth every 6 (six) hours as needed for moderate pain. 30 tablet 0  . ibuprofen (  ADVIL,MOTRIN) 800 MG tablet Take 1 tablet (800 mg total) by mouth every 8 (eight) hours as needed. 30 tablet 0  . metFORMIN (GLUCOPHAGE) 1000 MG tablet Take 1 tablet (1,000 mg total) by mouth 2 (two) times daily with a meal. 180 tablet 3  . oxyCODONE-acetaminophen (PERCOCET) 10-325 MG tablet 1 tab po bid 60 tablet 0   No current facility-administered medications on file prior to visit.     BP 121/77 (BP Location: Right Arm, Patient Position: Sitting, Cuff Size: Large)   Pulse (!) 121   Temp 99.4 F (37.4 C) (Oral)   Resp 17   Ht 5\' 10"  (1.778 m)   Wt 209 lb 12.8 oz (95.2 kg)   SpO2 100%   BMI 30.10 kg/m         Objective:   Physical Exam   General- No acute distress. Pleasant patient. Neck- Full range of motion, no jvd Lungs- Clear, even and unlabored. Heart- regular rate and rhythm. Neurologic- CNII- XII grossly intact.  Back- no mid spine pain. But left lower back. paralumbar and lower rib area pain on palpation.     Assessment & Plan:  For your back pain will give toradol today. Staring tomorrow afternoon 4pm can start mobic. Continue the percocet. Reminder that if any provider offers you pain med then have them contact us to discuss since you are on contract or don't accept/fill rx and call us next day.  Will rx flexeril at night for back spasms. Stop valium as directed since you state does not help.  Rx chantix today for smoking cessation. Rx advisement given.  Refilling you metformin. But schedule diabetic visit on March 11, 2015 or little later for cmp, a1c repeat.  Follow up in 2 wks or as needed

## 2016-02-28 ENCOUNTER — Telehealth: Payer: Self-pay

## 2016-02-28 MED ORDER — OXYCODONE-ACETAMINOPHEN 10-325 MG PO TABS: ORAL_TABLET | ORAL | 0 refills | Status: DC

## 2016-02-28 MED FILL — OXYCODONE-APAP 10-325: 10-325 | 30 days supply | Qty: 60 | Fill #0

## 2016-02-28 NOTE — Telephone Encounter (Signed)
Pt walked into the clinic today. Stating he came in because provider stated he could come and pick up Rx today.  Spoke to provider, he agreed and asked that Rx be printed for signature.  Rx printed and given to provider.  Rx signed and then given to patient.

## 2016-03-28 ENCOUNTER — Other Ambulatory Visit: Payer: Self-pay | Admitting: Medical

## 2016-03-28 NOTE — Telephone Encounter (Signed)
Patient called requesting a refill of oxyCODONE-acetaminophen (PERCOCET) 10-325 MG tablet, metFORMIN (GLUCOPHAGE) 1000 MG tablet and varenicline (CHANTIX PAK) 0.5 MG X 11 & 1 MG X 42 tablet Please advise  **Patient wants a paper prescription for all refills**  Phone: [phone]

## 2016-03-28 NOTE — Telephone Encounter (Signed)
Dr. Mallie Darting called wanted to discuss pt disability. I called pt to discuss this with him. See if he has every seen this doctor. Does this doctor work with insurance company/disability? Did not want to discuss with him unless pt has consented/approved.Not sure about hippa rules if this is MD working for  company evaluating disability. Would feel more comfortable if pt signed consent form specifically allowing me to speak with this MD. And would like know is he personal MD of patient or is he disability MD.  So would you call patient and get him to sign hippa form. Also will you call this MD or his office to clarify at [phone].  Tomorrow my long day and expect to be busy.

## 2016-03-28 NOTE — Telephone Encounter (Signed)
Refill request for Oxycodone 10-325mg  Last filled by MD on - 02/28/16, #60x0 Last AEX - 02/23/16 Next AEX - Patient was to return in [2] weeks for DM and labs; no future appointment scheduled  Refill request for Printed Rx for Metformin & Chantix Last filled by MD on - 02/23/16 Last AEX - 02/23/16 Next AEX - Patient was to return in [2] weeks for DM and labs; no future appointment scheduled  Please Advise on refills/SLS 01/24

## 2016-03-29 NOTE — Telephone Encounter (Signed)
Spoke with patient and he would like for you to call him back also I gave him the number to the other office to clarify with that Md. Patient also wants to know about prescriptions mentioned below.   Please advise  PC

## 2016-03-30 MED ORDER — VARENICLINE TARTRATE 0.5 MG X 11 & 1 MG X 42 PO MISC: ORAL | 0 refills | Status: DC

## 2016-03-30 MED ORDER — OXYCODONE-ACETAMINOPHEN 10-325 MG PO TABS: ORAL_TABLET | ORAL | 0 refills | Status: DC

## 2016-03-30 MED ORDER — METFORMIN HCL 1000 MG PO TABS: 1000.0000 mg | ORAL_TABLET | Freq: Two times a day (BID) | ORAL | 0 refills | Status: DC

## 2016-03-30 MED FILL — OXYCODONE-APAP 10-325: 10-325 | 5 days supply | Qty: 10 | Fill #0

## 2016-03-30 NOTE — Telephone Encounter (Signed)
Patient is calling again to find out what he needs to do to get his prescriptions. He has no problem coming in to sign any forms that need to be signed he just needs to know what needs to be done. Please advise

## 2016-03-30 NOTE — Telephone Encounter (Signed)
Please advise on Medication Refill request sent to you on 03/29/15 for Oxycodone, Metformin and Chantix/SLS 01/26

## 2016-03-30 NOTE — Telephone Encounter (Signed)
Printed rx oxycodone. Gave to staff up front to give to pt. Pt does not want me to talk to Dr. Mallie Darting. He wants to talk to his lawyer first. And if he does authorize me to talk then asked him to sign hippa release form. Pt will let me know what lawyer states.

## 2016-03-30 NOTE — Telephone Encounter (Signed)
Printed the Chantix & Metformin Rx that patient had also requested paper refills for in the 03/28/16 phone note/SLS 01/26

## 2016-03-30 NOTE — Telephone Encounter (Signed)
Will you send in chantix  1 mg #60 1 tab po bid with 1 refill. Also, looks like he does not need refill of metformin yet? I signed the rx you gave me. But if he did use starter back would need above chantix rx/sig.

## 2016-04-04 MED FILL — OXYCODONE-APAP 10-325: 10-325 | 25 days supply | Qty: 50 | Fill #0

## 2016-04-04 MED FILL — CHANTIX STARTING MONTH BOX: 0.5 MG X 11 | 28 days supply | Qty: 53 | Fill #0

## 2016-04-04 MED FILL — metFORMIN HCL 1000 MG TABS: 1000 | 30 days supply | Qty: 60 | Fill #0

## 2016-04-07 NOTE — Telephone Encounter (Signed)
I thought I already documented this. But I talked with pt and he does not want me to give any information to Dr. Mallie Darting. He states he wanted to talk to his lawyer first. He specifically instruct not to give any information.   So I will not discuss case unless pt specifically auhtorizes and signs form to that effect.

## 2016-04-07 NOTE — Telephone Encounter (Signed)
He wanted me not to talk with Dr. Mallie Darting.

## 2016-04-25 ENCOUNTER — Telehealth: Payer: Self-pay | Admitting: *Deleted

## 2016-04-25 MED ORDER — OXYCODONE-ACETAMINOPHEN 10-325 MG PO TABS: ORAL_TABLET | ORAL | 0 refills | Status: DC

## 2016-04-25 NOTE — Telephone Encounter (Signed)
Pt can pick up rx of oxycodone on Monday. When due. Too soon to pick up now.

## 2016-04-25 NOTE — Telephone Encounter (Signed)
Printed his oxycodone today. On rx not to fill sooner than 04-30-2016. Placed in file up front. Will advise pt can pick up on Monday.

## 2016-04-25 NOTE — Telephone Encounter (Signed)
Called [facility] @ 3:15pm @ [phone]) asking the pt to RTC regarding refill request.//AB/CMA

## 2016-04-25 NOTE — Telephone Encounter (Signed)
Spoke with the pt regarding the Disability Benefits form, and he stated that he does need to have this form completed.  Informed the pt that he will need to schedule an appointment with Percell Miller to go over the form and get it completed.  Pt verbalized understanding, but stated that he does not have any insurance right now.  So he will not be able to come in for a office visit right now.  He asked if I could hold on to the form until he can get some insurance and then he will call back to schedule an appointment to come in and see Percell Miller to have the form completed.  Will hold on to the form.//AB/CMA

## 2016-04-25 NOTE — Telephone Encounter (Signed)
Request for Oxycodone-acet. 10-325mg -Take 1 tablet by mouth bid. Last refill:03/30/16;#60,0 Last OV:02/23/16 UDS:10/10/15-Low risk Please advise.//AB/CMA

## 2016-04-25 NOTE — Telephone Encounter (Signed)
Called and [facility] @ 8:17am @ [phone]) asking the pt to RTC regarding Disability Benefits form.  Per Percell Miller pt will need an appointment  to complete form.//AB/CMA

## 2016-04-27 MED ORDER — METFORMIN HCL 1000 MG PO TABS: 1000.0000 mg | ORAL_TABLET | Freq: Two times a day (BID) | ORAL | 0 refills | Status: DC

## 2016-04-27 MED FILL — metFORMIN HCL 1000 MG TABS: 1000 | 30 days supply | Qty: 60 | Fill #0

## 2016-04-27 NOTE — Telephone Encounter (Signed)
Rx sent to the pharmacy by e-script.//AB/CMA 

## 2016-04-27 NOTE — Telephone Encounter (Signed)
Spoke with the pt and informed him of the message below from [place] regarding medication refill request.  Pt verbalized understanding and agreed.  Pt also asked for a refill on him Metformin 1000mg .//AB/CMA

## 2016-04-30 MED FILL — OXYCODONE-ACETAMINOPHEN 10-: 10-325 | 30 days supply | Qty: 60 | Fill #0

## 2016-05-28 ENCOUNTER — Telehealth: Payer: Self-pay | Admitting: Medical

## 2016-05-28 NOTE — Telephone Encounter (Signed)
Relation to pt: self  Call back number:[phone] Pharmacy: [place], [place] - [address] [phone] (Phone) [phone] (Fax)     Reason for call:  Patient requesting a refill varenicline (CHANTIX PAK) 0.5 MG X 11 & 1 MG X 42 tablet, metFORMIN (GLUCOPHAGE) 1000 MG tablet, oxyCODONE-acetaminophen (PERCOCET) 10-325 MG tablet

## 2016-05-29 ENCOUNTER — Telehealth: Payer: Self-pay | Admitting: Medical

## 2016-05-29 MED ORDER — OXYCODONE-ACETAMINOPHEN 10-325 MG PO TABS: ORAL_TABLET | ORAL | 0 refills | Status: DC

## 2016-05-29 MED ORDER — VARENICLINE TARTRATE 1 MG PO TABS: 1.0000 mg | ORAL_TABLET | Freq: Two times a day (BID) | ORAL | 0 refills | Status: DC

## 2016-05-29 MED ORDER — METFORMIN HCL 1000 MG PO TABS: 1000.0000 mg | ORAL_TABLET | Freq: Two times a day (BID) | ORAL | 3 refills | Status: DC

## 2016-05-29 NOTE — Telephone Encounter (Signed)
Patient called back to follow up on request for refills. States he needs his percocet today

## 2016-05-29 NOTE — Telephone Encounter (Addendum)
Rx percocet printed. He can pick up rx tomorrow. Sent in metformin and chantix to his pharmacy.  Will you get him to schedule appointment in one month with me.

## 2016-05-29 NOTE — Telephone Encounter (Signed)
Last Chantix Rx: 03/30/16, #53 Last Metformin Rx:  04/30/16, #180 (Per Pam, insurance only covers 30 days, pt has 2 refills and she will get 1 ready for pt) Percocet:  04/25/16 for 04/30/16 refill, #60. UDS: 10/10/15 Last OV: 02/23/16 Next OV:  Not scheduled and due now.  Please advise Chantix and Percocet request?

## 2016-05-30 MED FILL — OXYCODONE-ACETAMINOPHEN 10-: 10-325 | 30 days supply | Qty: 60 | Fill #0

## 2016-05-30 MED FILL — metFORMIN HCL 1000 MG TABS: 1000 | 30 days supply | Qty: 60 | Fill #0

## 2016-05-30 MED FILL — CHANTIX 1 MG TABLET: 1 | 28 days supply | Qty: 56 | Fill #0

## 2016-05-30 NOTE — Telephone Encounter (Signed)
Patient informed, understood & agreed to p/u Rx durning regular business hours/SLS 03/28

## 2016-06-28 ENCOUNTER — Other Ambulatory Visit: Payer: Self-pay | Admitting: Medical

## 2016-06-28 NOTE — Telephone Encounter (Signed)
°  Relation to VH:SJWT Call back [place]:  Reason for call:  Patient requesting a refill oxyCODONE-acetaminophen (PERCOCET) 10-325 MG tablet , patient requesting a few more pills to hold him over due to the fact he will be out of town the first week of June, patient will run out tomorrow, please afvise

## 2016-06-29 MED ORDER — OXYCODONE-ACETAMINOPHEN 10-325 MG PO TABS: ORAL_TABLET | ORAL | 0 refills | Status: DC

## 2016-06-29 MED FILL — OXYCODONE-ACETAMINOPHEN 10-: 10-325 | 30 days supply | Qty: 60 | Fill #0

## 2016-06-29 NOTE — Telephone Encounter (Signed)
Pt wife (who was properly identified) came to pick up scripts.

## 2016-06-29 NOTE — Telephone Encounter (Signed)
Discussed with provider.  Provider agreed to refill.  Pt notified and made aware.  He stated he would come to pick up medication today.

## 2016-06-29 NOTE — Telephone Encounter (Signed)
Last filled: 05/29/16  Amt: 60, 0 Last OV: 02/23/16 09/01/15 controlled substance contract signed, no uds sample given, 10/10/15 uds sample given  Please advise.

## 2016-06-29 NOTE — Telephone Encounter (Signed)
Patient checking on the status of medication request, patient states he's completely out today, please advise

## 2016-06-29 NOTE — Telephone Encounter (Signed)
I wrote pt his usual refill of oxycodone. He will be out of town on may 27 when he is due for next refill. And will be back on June 7,2018. So I wrote him post date prescription that he could get filled on Jul 29, 2016. This would cover him until he can get in on August 09, 2016 for the remainder of his due RX.  I thought this was best way to handle this situation.

## 2016-07-02 MED FILL — metFORMIN HCL 1000 MG TABS: 1000 | 30 days supply | Qty: 60 | Fill #1

## 2016-08-08 ENCOUNTER — Telehealth: Payer: Self-pay | Admitting: Medical

## 2016-08-08 NOTE — Telephone Encounter (Signed)
Pt states has 2 pills of oxycodone left. Pt request refill.  Pt also req refill of metformin for this month. Pt also wants it noted that for July forward he wants to start 90 days scripts with [facility]. Call pt [phone].

## 2016-08-08 NOTE — Telephone Encounter (Signed)
Refill Request: Oxycodone  Last RX:06/29/16 Last OV:02/23/16 Next QV:OHCS scheduled  UDS:10/10/15 CSC:09/01/15  Notified pt he is due  For UDS and contract, Pt voiced understanding.

## 2016-08-09 MED ORDER — OXYCODONE-ACETAMINOPHEN 10-325 MG PO TABS: ORAL_TABLET | ORAL | 0 refills | Status: DC

## 2016-08-09 MED FILL — metFORMIN HCL 1000 MG TABS: 1000 | 30 days supply | Qty: 60 | Fill #2

## 2016-08-09 MED FILL — OXYCODONE-ACETAMINOPHEN 10-: 10-325 | 30 days supply | Qty: 60 | Fill #0

## 2016-08-09 NOTE — Telephone Encounter (Signed)
Notified pt rx is ready for pick up  

## 2016-08-09 NOTE — Telephone Encounter (Signed)
Prescription refill regularly by PCP, okay #60, no refills

## 2016-08-10 ENCOUNTER — Encounter: Payer: Self-pay | Admitting: Medical

## 2016-09-06 ENCOUNTER — Other Ambulatory Visit: Payer: Self-pay | Admitting: Medical

## 2016-09-06 NOTE — Telephone Encounter (Addendum)
Relation to IN:OMVE Call back number:[phone]   Reason for call:  Patient requesting oxyCODONE-acetaminophen (PERCOCET) 10-325 MG tablet refill, will run out Saturday, please advise

## 2016-09-07 ENCOUNTER — Telehealth: Payer: Self-pay | Admitting: Medical

## 2016-09-07 MED ORDER — OXYCODONE-ACETAMINOPHEN 10-325 MG PO TABS: ORAL_TABLET | ORAL | 0 refills | Status: DC

## 2016-09-07 MED FILL — OXYCODONE-ACETAMINOPHEN 10-: 10-325 | 30 days supply | Qty: 60 | Fill #0

## 2016-09-07 NOTE — Telephone Encounter (Signed)
Refill Request: Percocet  Last RX:08/09/16 Last OV:02/23/16 Next GN:OIBB scheduled  UDS:08/08/16  CSC:09/01/15

## 2016-09-07 NOTE — Telephone Encounter (Signed)
Notified pt he can not get any more refills until he is seen in office. Pt stats he will call back and schedule appointment.

## 2016-09-07 NOTE — Telephone Encounter (Signed)
Must be seen for next refill. Place a note. Don't print next rx for me to sign. Have him go ahead and schedule.

## 2016-09-12 MED FILL — metFORMIN HCL 1000 MG TABS: 1000 | 30 days supply | Qty: 60 | Fill #3

## 2016-10-05 ENCOUNTER — Ambulatory Visit (INDEPENDENT_AMBULATORY_CARE_PROVIDER_SITE_OTHER): Payer: Medicare Other | Admitting: Medical

## 2016-10-05 ENCOUNTER — Encounter: Payer: Self-pay | Admitting: Medical

## 2016-10-05 VITALS — BP 170/90 | HR 81 | Temp 98.5°F | Resp 16 | Ht 72.0 in | Wt 208.6 lb

## 2016-10-05 DIAGNOSIS — G894 Chronic pain syndrome: Secondary | ICD-10-CM | POA: Diagnosis not present

## 2016-10-05 DIAGNOSIS — F17200 Nicotine dependence, unspecified, uncomplicated: Secondary | ICD-10-CM | POA: Diagnosis not present

## 2016-10-05 DIAGNOSIS — E119 Type 2 diabetes mellitus without complications: Secondary | ICD-10-CM

## 2016-10-05 DIAGNOSIS — Z1211 Encounter for screening for malignant neoplasm of colon: Secondary | ICD-10-CM | POA: Diagnosis not present

## 2016-10-05 DIAGNOSIS — I10 Essential (primary) hypertension: Secondary | ICD-10-CM

## 2016-10-05 LAB — COMPREHENSIVE METABOLIC PANEL
ALT: 56 U/L — ABNORMAL HIGH (ref 0–53)
AST: 54 U/L — ABNORMAL HIGH (ref 0–37)
Albumin: 4.1 g/dL (ref 3.5–5.2)
Alkaline Phosphatase: 64 U/L (ref 39–117)
BUN: 9 mg/dL (ref 6–23)
CALCIUM: 9.2 mg/dL (ref 8.4–10.5)
CO2: 24 meq/L (ref 19–32)
Chloride: 103 mEq/L (ref 96–112)
Creatinine, Ser: 0.79 mg/dL (ref 0.40–1.50)
GFR: 108.21 mL/min (ref >60.00)
GLUCOSE: 290 mg/dL — AB (ref 70–99)
POTASSIUM: 3.9 meq/L (ref 3.5–5.1)
Sodium: 136 mEq/L (ref 135–145)
Total Bilirubin: 0.9 mg/dL (ref 0.2–1.2)
Total Protein: 7.3 g/dL (ref 6.0–8.3)

## 2016-10-05 LAB — HEMOGLOBIN A1C: HEMOGLOBIN A1C: 9.9 % — AB (ref 4.6–6.5)

## 2016-10-05 MED ORDER — OXYCODONE-ACETAMINOPHEN 10-325 MG PO TABS: ORAL_TABLET | ORAL | 0 refills | Status: DC

## 2016-10-05 MED ORDER — VARENICLINE TARTRATE 1 MG PO TABS: 1.0000 mg | ORAL_TABLET | Freq: Two times a day (BID) | ORAL | 0 refills | Status: DC

## 2016-10-05 MED ORDER — LOSARTAN POTASSIUM 100 MG PO TABS: 100.0000 mg | ORAL_TABLET | Freq: Every day | ORAL | 0 refills | Status: DC

## 2016-10-05 MED FILL — LOSARTAN POTASSIUM 100 MG T: 100 | 90 days supply | Qty: 90 | Fill #0

## 2016-10-05 NOTE — Patient Instructions (Addendum)
For diabetes get a1c and cmp today. Recent reported side effects from metformin 1000 mg twice daily. Can reduce temporarily to 500 mg twice daily. May need to rx new med.   For htn restart losartan.  Back pain will rx pain medication. Post date rx.  Refill chantix.  Follow up in 2 weeks or as needed  Referral to GI for colonoscopy.

## 2016-10-05 NOTE — Progress Notes (Signed)
Subjective:    Patient ID: Jeffrey Austin, male    DOB: [birth year], 56 y.o.   MRN: [id number]  HPI  Pt in for follow up on his chronic back pain. It has been worse since very rainy weather. Pain constant not easing up. Pt states recently trying to cut grass made pain worse.  Pt disability did eventually get worse.  Pt on chronic pain medication. I have been giving monthly basis.  Pt contract up to date.  Pt states he is recently very bored.  Pt is diabetic he does not check his sugars daily. Last a1c was 10.0. Pt was on metformin. He felt bloated and abdomen upset. He stopped med 4 days ago and gi symptoms stopped.,   Review of Systems  Constitutional: Negative for chills and fatigue.  Respiratory: Negative for cough, choking, shortness of breath and wheezing.   Cardiovascular: Negative for chest pain and palpitations.  Gastrointestinal: Negative for abdominal pain, blood in stool, constipation, diarrhea and vomiting.  Endocrine: Positive for polydipsia. Negative for polyphagia and polyuria.  Genitourinary: Negative for dysuria and flank pain.  Musculoskeletal: Positive for back pain.  Hematological: Negative for adenopathy. Does not bruise/bleed easily.   Past Medical History:  Diagnosis Date  . Back pain   . Diabetes mellitus without complication ([place])   . Hypertension      Social History   Social History  . Marital status: Married    Spouse name: N/A  . Number of children: N/A  . Years of education: N/A   Occupational History  . Not on file.   Social History Main Topics  . Smoking status: Never Smoker  . Smokeless tobacco: Never Used  . Alcohol use 0.0 oz/week     Comment: 1-2 beers a week  . Drug use: No  . Sexual activity: Yes   Other Topics Concern  . Not on file   Social History Narrative  . No narrative on file    Past Surgical History:  Procedure Laterality Date  . cyst removed from testicle     . INCISIONAL HERNIA REPAIR N/A 02/01/2016   Procedure: LAPAROSCOPIC INCISIONAL HERNIA REPAIR;  Surgeon: Arta Bruce Kinsinger, MD;  Location: [facility];  Service: General;  Laterality: N/A;  . INSERTION OF MESH N/A 02/01/2016   Procedure: INSERTION OF MESH;  Surgeon: Arta Bruce Kinsinger, MD;  Location: [facility];  Service: General;  Laterality: N/A;  . LUMBAR FUSION    . SPINAL FUSION      Family History  Problem Relation Age of Onset  . Leukemia Father     No Known Allergies  Current Outpatient Prescriptions on File Prior to Visit  Medication Sig Dispense Refill  . amLODipine (NORVASC) 10 MG tablet Take 10 mg by mouth daily.    Marland Kitchen amoxicillin-clavulanate (AUGMENTIN) 875-125 MG tablet Take 1 tablet by mouth 2 (two) times daily. Prevention for dental procedure for mandibular tori    . cyclobenzaprine (FLEXERIL) 5 MG tablet 1 tab po q hs 10 tablet 0  . ibuprofen (ADVIL,MOTRIN) 800 MG tablet Take 1 tablet (800 mg total) by mouth every 8 (eight) hours as needed. 30 tablet 0  . meloxicam (MOBIC) 7.5 MG tablet 1-2 tab po q day 30 tablet 0  . metFORMIN (GLUCOPHAGE) 1000 MG tablet Take 1 tablet (1,000 mg total) by mouth 2 (two) times daily with a meal. 60 tablet 3  . oxyCODONE-acetaminophen (PERCOCET) 10-325 MG tablet 1 tab po bid 60 tablet 0  . varenicline (CHANTIX) 1 MG tablet  Take 1 tablet (1 mg total) by mouth 2 (two) times daily. 60 tablet 0   No current facility-administered medications on file prior to visit.     BP (!) 177/100   Pulse 81   Temp 98.5 F (36.9 C) (Oral)   Resp 16   Ht 6' (1.829 m)   Wt 208 lb 9.6 oz (94.6 kg)   SpO2 100%   BMI 28.29 kg/m       Objective:   Physical Exam  General Appearance- Not in acute distress.    Chest and Lung Exam Auscultation: Breath sounds:-Normal. Clear even and unlabored. Adventitious sounds:- No Adventitious sounds.  Cardiovascular Auscultation:Rythm - Regular, rate and rythm. Heart Sounds -Normal heart sounds.  Abdomen Inspection:-Inspection Normal.    Palpation/Perucssion: Palpation and Percussion of the abdomen reveal- Non Tender, No Rebound tenderness, No rigidity(Guarding) and No Palpable abdominal masses.  Liver:-Normal.  Spleen:- Normal.   Back Mid lumbar spine tenderness to palpation. Pain on straight leg lift. Pain on lateral movements and flexion/extension of the spine.  Lower ext neurologic  L5-S1 sensation intact bilaterally. Normal patellar reflexes bilaterally. No foot drop bilaterally.      Assessment & Plan:  For diabetes get a1c and cmp today. Recent reported side effects from metformin 1000 mg twice daily. Can reduce temporarily to 500 mg twice daily. May need to rx new med.   For htn restart amlodipine.  Back pain will rx pain medication. Post date rx.  Refill chantix.  Follow up in 2 weeks or as needed  Merriel Zinger, Percell Miller, [employer]

## 2016-10-06 ENCOUNTER — Telehealth: Payer: Self-pay | Admitting: Medical

## 2016-10-06 DIAGNOSIS — E119 Type 2 diabetes mellitus without complications: Secondary | ICD-10-CM

## 2016-10-06 NOTE — Telephone Encounter (Signed)
Referral for diabetes education.

## 2016-10-08 ENCOUNTER — Telehealth: Payer: Self-pay | Admitting: Medical

## 2016-10-08 MED ORDER — CANAGLIFLOZIN 300 MG PO TABS: 300.0000 mg | ORAL_TABLET | Freq: Every day | ORAL | 2 refills | Status: DC

## 2016-10-08 MED FILL — OXYCODONE-APAP 10-325 MG TA: 10-325 | 30 days supply | Qty: 60 | Fill #0

## 2016-10-08 NOTE — Telephone Encounter (Signed)
I did send invokana today. But I want him to take metformin 1/2 tab of 1000 mg twice daily if he can tolerate the half dose. If we can't control blood sugar with oral meds let him know would consider injection med such as vicotza or might consider referral to endocrinologist.I put in referral to diabetic educator please advise him to attend that appointment

## 2016-10-09 NOTE — Telephone Encounter (Signed)
Pt states he will try to take metformin 1000 mg 1/2 tab twice daily. Pt states medicare does not pay for invokana pharmacy wanted him to $400. Pt did receive a call for diabetic teaching class.

## 2016-10-11 ENCOUNTER — Telehealth: Payer: Self-pay | Admitting: Medical

## 2016-10-11 MED ORDER — LIRAGLUTIDE 18 MG/3ML ~~LOC~~ SOPN: 0.6000 mg | PEN_INJECTOR | Freq: Once | SUBCUTANEOUS | 0 refills | Status: DC

## 2016-10-11 NOTE — Telephone Encounter (Signed)
Let pt know that since he can't take full dose metformin and invokana too expensive that I am considering victoza is a good option to bring down sugars. I sent in prescription and will see if this is covered. It is injection med. When is his follow up with me. If more than a week then he needs sooner appointment so we can discuss med options.

## 2016-10-11 NOTE — Telephone Encounter (Signed)
Pt called in returning call  CB: # on file.

## 2016-10-12 NOTE — Telephone Encounter (Signed)
Spoke with pt

## 2016-10-12 NOTE — Telephone Encounter (Signed)
Pt notified. Pt states he will give [place] a call if medication is not covered. Appointment scheduled of 8/17/8

## 2016-10-15 ENCOUNTER — Telehealth: Payer: Self-pay | Admitting: Medical

## 2016-10-15 NOTE — Telephone Encounter (Addendum)
Relation to JS:RPRX Call back [phone]  Reason for call:  Patient states both medications mentioned below were denied, requesting alternate and would like to discuss, please advise

## 2016-10-15 NOTE — Telephone Encounter (Signed)
Opened to review after victoza also denied. Acutally cost of victoza would be about $477 per pharmacy.

## 2016-10-16 ENCOUNTER — Telehealth: Payer: Self-pay | Admitting: Medical

## 2016-10-16 NOTE — Telephone Encounter (Signed)
Left pt message to call insurance company to see what they cover and give [place] a call back.

## 2016-10-16 NOTE — Telephone Encounter (Signed)
Opened to review since diabetic meds consistently not well covered? Pt coming in I believe this week.

## 2016-10-16 NOTE — Telephone Encounter (Signed)
Will you call pt and ask him to investigate his insurance medication coverage. Does he have a deductable to meet. He just got put on medicare since last seeing him. Did price of prior meds go up? Every diabetic med I have been tyring to write him has been over $400.

## 2016-10-19 ENCOUNTER — Ambulatory Visit: Payer: Medicare Other | Admitting: Medical

## 2016-10-19 NOTE — Telephone Encounter (Signed)
Patient would like to speak with [facility] only regarding message below, patient aware jasmine is out of the office and will wait until she returns

## 2016-10-23 ENCOUNTER — Ambulatory Visit: Payer: Medicare Other

## 2016-10-24 ENCOUNTER — Telehealth: Payer: Self-pay | Admitting: Medical

## 2016-10-24 NOTE — Telephone Encounter (Signed)
I accidentally closed a message on this patient. But I was wondering is she scheduled to see me sometime this week? Or early next week? To discuss the elevated sugar last diabetes and problems filling prescription due to cost.

## 2016-10-24 NOTE — Telephone Encounter (Signed)
PA approved for [facility].

## 2016-10-25 NOTE — Telephone Encounter (Signed)
Pt is having some problems with insurance and he would have to pay full price of office visit. Pt does not want to come in until he get insurance worked out. Please advise.

## 2016-10-25 NOTE — Telephone Encounter (Signed)
Saguier, Percell Miller, PA-C      6:24 PM  Note    I accidentally closed a message on this patient. But I was wondering is she scheduled to see me sometime this week? Or early next week? To discuss the elevated sugar last diabetes and problems filling prescription due to cost.

## 2016-10-25 NOTE — Telephone Encounter (Signed)
Advise patient to schedule appointment regarding PA recommendation, patient declined and would like to speak Mona directly, please advise

## 2016-10-30 ENCOUNTER — Ambulatory Visit: Payer: Medicare Other

## 2016-11-01 NOTE — Telephone Encounter (Signed)
Was curious on patient's insurance situation. Has anything changed? Is he still expected to pay office visits completely? Has he inquired about his medication benefit plan? Does he have a deductible to be met with his medications?

## 2016-11-02 NOTE — Telephone Encounter (Signed)
Left pt a message to call back. 

## 2016-11-06 ENCOUNTER — Ambulatory Visit: Payer: Medicare Other

## 2016-11-06 ENCOUNTER — Telehealth: Payer: Self-pay | Admitting: Medical

## 2016-11-06 NOTE — Telephone Encounter (Signed)
Self   Refill for Metformin &  Oxycodone    CB: [phone]

## 2016-11-07 ENCOUNTER — Telehealth: Payer: Self-pay | Admitting: Medical

## 2016-11-07 MED ORDER — METFORMIN HCL 1000 MG PO TABS: 1000.0000 mg | ORAL_TABLET | Freq: Two times a day (BID) | ORAL | 3 refills | Status: DC

## 2016-11-07 MED ORDER — OXYCODONE-ACETAMINOPHEN 10-325 MG PO TABS: ORAL_TABLET | ORAL | 0 refills | Status: DC

## 2016-11-07 MED FILL — metFORMIN HCL 1000 MG TABS: 1000 | 30 days supply | Qty: 60 | Fill #0

## 2016-11-07 MED FILL — OXYCODONE/APAP 10/325 MG TA: 10-325 | 30 days supply | Qty: 60 | Fill #0

## 2016-11-07 NOTE — Telephone Encounter (Signed)
Refill Request: Oxycodone  Last RX:10/05/16 Last OV:10/05/16 Next OV: 01/08/17 UDS:08/08/16  CSC:08/08/16

## 2016-11-07 NOTE — Telephone Encounter (Signed)
I signed his script on 11-07-2016 for oxycodone correct). Looks like you filled his metformin.   Actually ask Ashlee about controlled meds. I think new policy is in place that for narcotics has to come in every 3 months. It may be every 4 months.   Notify pt when we know new policy. Let me know as well. Also asked about his insurance. Do they cover visits? He states he thought they don't pay for visits?   He actually needs visit to discuss diabetic options. I might give samples of victoza. But want to discuss rare side effects first that could occur. Also advise him if willing to come in then would early am or pm appointment. Make sure he is not given late morning appointment if he demands one explain I don't want him to wait long time.

## 2016-11-07 NOTE — Telephone Encounter (Signed)
Opened to review 

## 2016-11-07 NOTE — Telephone Encounter (Signed)
Pt dropped off document to be filled out by provider (Disability Parking Placard Document-1 page in a white envelope). Pt would like to be called when ready to pick up at tel [phone]. Document put at front office tray.

## 2016-11-08 NOTE — Telephone Encounter (Signed)
Pt states that he is still having problems with insurance. Pt just received a bill from last ov with PCP. Pt has to get insurance worked out before making an appointment.

## 2016-11-08 NOTE — Telephone Encounter (Signed)
ES-This is concerning note about patient needing appt for future controlled substances/plz see below/thx dmf  Pt states that he is still having problems with insurance. Pt just received a bill from last ov with PCP. Pt has to get insurance worked out before making an appointment.

## 2016-11-12 NOTE — Telephone Encounter (Signed)
Received Application and Renewal of Disability Parking Placard form [place] [employer]; forward to provider/SLS 09/10

## 2016-11-13 ENCOUNTER — Telehealth: Payer: Self-pay | Admitting: Medical

## 2016-11-13 NOTE — Telephone Encounter (Signed)
Filled out patient disability parking placard. He should qualify for 5 year and then renewal that point. It will be a mild folders or will blading on your keyboard. You can fax it or patient to pick up form.

## 2016-11-14 NOTE — Telephone Encounter (Signed)
Left pt a message notifying him form is ready to be picked up at front desk.

## 2016-12-03 ENCOUNTER — Telehealth: Payer: Self-pay | Admitting: Medical

## 2016-12-03 NOTE — Telephone Encounter (Signed)
Pt request refill Percocet. Call pt when ready.

## 2016-12-04 ENCOUNTER — Telehealth: Payer: Self-pay | Admitting: Medical

## 2016-12-04 MED ORDER — OXYCODONE-ACETAMINOPHEN 10-325 MG PO TABS: ORAL_TABLET | ORAL | 0 refills | Status: DC

## 2016-12-04 NOTE — Telephone Encounter (Signed)
I signed patient's Percocet prescription. He can refill percocet on 12-07-2016.  Checked narx care report site today.   Also will you explain he needs office visit in December to continue filling narcotic. New rule. Must be seen every 4 months. I know he has insurance issues but this is a must if he wants to continue to get controlled med from me.  He would need 30 minute apponitment.

## 2016-12-04 NOTE — Telephone Encounter (Signed)
Open to cancel Percocet prescription and reprinted with post date to fill.

## 2016-12-04 NOTE — Telephone Encounter (Signed)
Refill  Request : Percocet  Last RX:11/07/16 Last OV:10/05/16 Next XH:FSFS scheduled  UDS:08/08/16 CSC:08/08/16

## 2016-12-06 NOTE — Telephone Encounter (Signed)
Please call and schedule follow up appointment.

## 2016-12-06 NOTE — Telephone Encounter (Signed)
lvm for pt to return call to schedule an apt.

## 2016-12-07 MED FILL — OXYCODONE-APAP 10-325 MG TA: 10-325 | 30 days supply | Qty: 60 | Fill #0

## 2016-12-24 ENCOUNTER — Telehealth: Payer: Self-pay | Admitting: Medical

## 2016-12-24 NOTE — Telephone Encounter (Signed)
Relation to DT:PNSQ Call back number:[phone] Pharmacy: [place], [place] - [address] [phone] (Phone) [phone] (Fax)    Reason for call: patient requesting a refill losartan (COZAAR) 100 MG tablet, metFORMIN (GLUCOPHAGE) 1000 MG tablet and oxyCODONE-acetaminophen (PERCOCET) 10-325 MG tablet

## 2016-12-26 MED ORDER — MELOXICAM 7.5 MG PO TABS: ORAL_TABLET | ORAL | 0 refills | Status: DC

## 2016-12-26 MED ORDER — METFORMIN HCL 1000 MG PO TABS: 1000.0000 mg | ORAL_TABLET | Freq: Two times a day (BID) | ORAL | 3 refills | Status: DC

## 2016-12-26 MED FILL — metFORMIN HCL 1000 MG TABS: 1000 | 30 days supply | Qty: 60 | Fill #0

## 2016-12-26 NOTE — Telephone Encounter (Signed)
Refill Request: Percocet   Last RX:12/04/16 Last OV:10/05/16 Next OV:ANVB scheduled  UDS:08/08/16 CSC:08/08/16  Please advise.

## 2016-12-27 ENCOUNTER — Telehealth: Payer: Self-pay | Admitting: Medical

## 2016-12-27 MED ORDER — OXYCODONE-ACETAMINOPHEN 10-325 MG PO TABS: ORAL_TABLET | ORAL | 0 refills | Status: DC

## 2016-12-27 MED ORDER — LOSARTAN POTASSIUM 100 MG PO TABS: 100.0000 mg | ORAL_TABLET | Freq: Every day | ORAL | 0 refills | Status: DC

## 2016-12-27 MED FILL — LOSARTAN POTASSIUM 100 MG T: 100 | 90 days supply | Qty: 90 | Fill #0

## 2016-12-27 NOTE — Telephone Encounter (Signed)
Losartan refilled 

## 2016-12-27 NOTE — Telephone Encounter (Signed)
Open to review and write prescription for Percocet

## 2016-12-27 NOTE — Telephone Encounter (Addendum)
I can cover him for few months possible in January and February if he gets in a bind. But can't prescribe any controlled med sooner than his actual due date. Also have him notify me when he actually estabishes care with new provider. He needs to start working on finding someone now. Sometimes can take a while to get scheduled.

## 2016-12-27 NOTE — Telephone Encounter (Signed)
I printed patient's prescription of Percocet.  Postdated that prescription.  I have aware that he has recent insurance problems and therefore has expressed can come in the office for a visit.  However with controlled medications he will need to be seen in December.  That would be about 4 months since I last saw him.  He has other medical problems that need to be addressed as well.  Particularly diabetes and he has been having difficulty with cost of medications.  He needs a 30-minute appointment and I have expressed in the past that I do not want him scheduled at the end of the morning when I am running late for it is virtually lunchtime(historically he has also been the last patient in the morning).  I would like to see him early morning first patient for early afternoon 1 PM.  But again 30-minute slot

## 2016-12-27 NOTE — Telephone Encounter (Signed)
Notified pt. Pt states he will make an appointment in Dec. Pt states he is going to have to change PCP's in January because we will be out of network want to know if you will be able to give extra medication in Dec to hold him over.

## 2016-12-31 NOTE — Telephone Encounter (Signed)
Patient returning call, please advise. °

## 2016-12-31 NOTE — Telephone Encounter (Signed)
Left pt a message to call back. 

## 2017-01-01 NOTE — Telephone Encounter (Signed)
Notified pt of message below. Pt voiced understanding.

## 2017-01-01 NOTE — Telephone Encounter (Signed)
Left pt a message to call back. 

## 2017-01-07 MED FILL — OXYCODONE-APAP 10-325 MG TA: 10-325 | 30 days supply | Qty: 60 | Fill #0

## 2017-01-08 ENCOUNTER — Ambulatory Visit: Payer: Medicare Other | Admitting: Medical

## 2017-02-05 ENCOUNTER — Telehealth: Payer: Self-pay | Admitting: Medical

## 2017-02-05 NOTE — Telephone Encounter (Signed)
Copied from [place] [id number]. Topic: Quick Communication - See Telephone Encounter >> Feb 05, 2017  3:24 PM Oneta Rack wrote: CRM for notification. See Telephone encounter for:   02/05/17.  CRelation to pt: self Call back number:[phone] Pharmacy: [place], [place] - [address] [phone] (Phone) [phone] (Fax)    Reason for call:  Patient requesting  oxyCODONE-acetaminophen (PERCOCET) 10-325 MG tablet refill, patient would like to pick up Rx Thursday, please advise

## 2017-02-06 ENCOUNTER — Telehealth: Payer: Self-pay | Admitting: Medical

## 2017-02-06 MED ORDER — OXYCODONE-ACETAMINOPHEN 10-325 MG PO TABS: ORAL_TABLET | ORAL | 0 refills | Status: DC

## 2017-02-06 NOTE — Telephone Encounter (Signed)
Jeffrey Austin is requesting another month of his Percocet.  This would be his 109-month of medication since I last saw him.  He needs to come in but he is recently reluctant due to insurance change.  In fact on chart review he states he is changing providers in January.  So I could prescribe him 1 more month of Percocet.  He can pick up that prescription but explained to him if he does not change providers he needs to see me in January.  Document that he was advised.  Also can you put a note in the chart that the patient engagement center can see.  Also if he does verify that he is indeed switching to another clinic will you go ahead and take me off as his PCP.

## 2017-02-06 NOTE — Telephone Encounter (Signed)
See telephone note I just sent you.

## 2017-02-06 NOTE — Telephone Encounter (Signed)
Refill request: Percocet  Last RX:12/27/16 Last OV:10/05/2016 Next OV: None scheduled  UDS:08/08/2016 CSC:08/08/2016

## 2017-02-07 MED FILL — OXYCODONE-APAP 10-325 MG TA: 10-325 | 30 days supply | Qty: 60 | Fill #0

## 2017-02-07 NOTE — Telephone Encounter (Signed)
Left pt a message notifying him of message below.

## 2017-02-15 NOTE — Telephone Encounter (Addendum)
February 07, 2017  Richardson Landry [place], [place]     10:13 AM  Note    Left pt a message notifying him of message below.     February 06, 2017       4:17 PM  Saguier, Percell Miller, PA-C routed this conversation to Hinton Dyer, [place], Percell Miller, [place]     4:15 PM  Note    Mr. Stehlin is requesting another month of his Percocet.  This would be his 47-month of medication since I last saw him.  He needs to come in but he is recently reluctant due to insurance change.  In fact on chart review he states he is changing providers in January.  So I could prescribe him 1 more month of Percocet.  He can pick up that prescription but explained to him if he does not change providers he needs to see me in January.  Document that he was advised.  Also can you put a note in the chart that the patient engagement center can see.  Also if he does verify that he is indeed switching to another clinic will you go ahead and take me off as his PCP.

## 2017-03-08 DIAGNOSIS — G8929 Other chronic pain: Secondary | ICD-10-CM | POA: Diagnosis not present

## 2017-03-08 DIAGNOSIS — I10 Essential (primary) hypertension: Secondary | ICD-10-CM | POA: Diagnosis not present

## 2017-03-08 DIAGNOSIS — F17200 Nicotine dependence, unspecified, uncomplicated: Secondary | ICD-10-CM | POA: Diagnosis not present

## 2017-03-08 DIAGNOSIS — E119 Type 2 diabetes mellitus without complications: Secondary | ICD-10-CM | POA: Diagnosis not present

## 2017-03-08 DIAGNOSIS — M545 Low back pain: Secondary | ICD-10-CM | POA: Diagnosis not present

## 2017-03-10 DIAGNOSIS — I10 Essential (primary) hypertension: Secondary | ICD-10-CM | POA: Insufficient documentation

## 2017-03-10 DIAGNOSIS — E1165 Type 2 diabetes mellitus with hyperglycemia: Secondary | ICD-10-CM | POA: Insufficient documentation

## 2017-03-10 DIAGNOSIS — Z720 Tobacco use: Secondary | ICD-10-CM | POA: Insufficient documentation

## 2017-03-10 DIAGNOSIS — E119 Type 2 diabetes mellitus without complications: Secondary | ICD-10-CM | POA: Insufficient documentation

## 2017-03-11 NOTE — Telephone Encounter (Addendum)
Relation to pt: self  Call back number: [phone]   Reason for call:  Patient states do to insurance change new provider is not in network therefore he would like to re establish with [facility]. Patient confirmed Dr. Nani Ravens in network. Please advise

## 2017-03-11 NOTE — Telephone Encounter (Signed)
I'm not sure we'd be the best fit, but he is welcome to schedule with me for his next appointment and we can decide if we are a good match. TY.

## 2017-03-11 NOTE — Telephone Encounter (Signed)
Looks like Dr. Nani Ravens willing to meet patient. Would you let me know when patient has appointment with him.

## 2017-03-18 ENCOUNTER — Ambulatory Visit (INDEPENDENT_AMBULATORY_CARE_PROVIDER_SITE_OTHER): Payer: Medicare HMO | Admitting: Family Medicine

## 2017-03-18 ENCOUNTER — Encounter: Payer: Self-pay | Admitting: Family Medicine

## 2017-03-18 VITALS — BP 128/86 | HR 80 | Temp 98.8°F | Ht 71.0 in | Wt 202.2 lb

## 2017-03-18 DIAGNOSIS — G8929 Other chronic pain: Secondary | ICD-10-CM | POA: Diagnosis not present

## 2017-03-18 DIAGNOSIS — Z720 Tobacco use: Secondary | ICD-10-CM

## 2017-03-18 DIAGNOSIS — M545 Low back pain: Secondary | ICD-10-CM

## 2017-03-18 MED ORDER — BUPROPION HCL ER (SR) 150 MG PO TB12: 150.0000 mg | ORAL_TABLET | Freq: Two times a day (BID) | ORAL | 2 refills | Status: DC

## 2017-03-18 MED ORDER — OXYCODONE-ACETAMINOPHEN 10-325 MG PO TABS: ORAL_TABLET | ORAL | 0 refills | Status: DC

## 2017-03-18 NOTE — Progress Notes (Signed)
Pre visit review using our clinic review tool, if applicable. No additional management support is needed unless otherwise documented below in the visit note. 

## 2017-03-18 NOTE — Patient Instructions (Addendum)
If you do not hear anything about your referral in the next 1-2 weeks, call our office and ask for an update.  Let us know if you need anything.  

## 2017-03-18 NOTE — Progress Notes (Signed)
Chief Complaint  Patient presents with  . Establish Care    Subjective: Patient is a 57 y.o. male here for medication management.  Hx of DM and chronic lbp and tobacco abuse.   DM- 9.1 A1c several days ago. Has stopped drinking for this month, things are improving. No exercise. Refuses flu shots, refuses PCV today.   Started smoking around 5 mo ago because he was "bored". Would like to quit. Wife had success with Chantix, but it is too expensive. Failed patches.   Hx of chronic low back pain. Currently on percocet. Multiple NS's. Does not see a pain clinic. Will get shooting pain down legs. B/l, worse in middle. No loss of bowel/bladder function.  ROS: Heart: Denies chest pain   Lungs: Denies SOB   Family History  Problem Relation Age of Onset  . Leukemia Father    Past Medical History:  Diagnosis Date  . Back pain   . Diabetes mellitus without complication ([place])   . Hypertension    No Known Allergies  Current Outpatient Medications:  .  losartan (COZAAR) 100 MG tablet, Take 1 tablet (100 mg total) by mouth daily., Disp: 90 tablet, Rfl: 0 .  metFORMIN (GLUCOPHAGE) 1000 MG tablet, Take 1 tablet (1,000 mg total) by mouth 2 (two) times daily with a meal., Disp: 60 tablet, Rfl: 3 .  amoxicillin-clavulanate (AUGMENTIN) 875-125 MG tablet, Take 1 tablet by mouth 2 (two) times daily. Prevention for dental procedure for mandibular tori, Disp: , Rfl:  .  liraglutide 18 MG/3ML SOPN, Inject 0.1 mLs (0.6 mg total) into the skin once., Disp: 0.1 mL, Rfl: 0 .  meloxicam (MOBIC) 7.5 MG tablet, 1-2 tab po q day (Patient not taking: Reported on 03/18/2017), Disp: 30 tablet, Rfl: 0 .  oxyCODONE-acetaminophen (PERCOCET) 10-325 MG tablet, 1 tab po bid(can fill on 01-07-2017) (Patient not taking: Reported on 03/18/2017), Disp: 60 tablet, Rfl: 0  Objective: BP 128/86 (BP Location: Left Arm, Patient Position: Sitting, Cuff Size: Normal)   Pulse 80   Temp 98.8 F (37.1 C) (Oral)   Ht 5\' 11"  (1.803  m)   Wt 202 lb 4 oz (91.7 kg)   SpO2 97%   BMI 28.21 kg/m  General: Awake, appears stated age HEENT: MMM, EOMi Heart: RRR, no murmurs Lungs: CTAB, no rales, wheezes or rhonchi. No accessory muscle use MSK: +mildline TTP over L3-5. Gait antalgic.  Neuro: DTR's equal and symmetric. No cerebellar signs.  Psych: Age appropriate judgment and insight, normal affect and mood  Assessment and Plan: Chronic bilateral low back pain without sciatica - Plan: Ambulatory referral to Pain Clinic  Tobacco abuse  Orders as above. Will refrain from cutting him off as he has signed csc, neg uds on 1/4.  Refer to pain management, will bridge until he can get in. If not, I will start to wean him down and add SNRI.  Commended him on stopping smoking. Try Zyban. 1 tab daily for 3 days then BID started 7 days before quit date. F/u in 3 mo.  The patient voiced understanding and agreement to the plan.  B[place], DO 03/18/17  3:24 PM

## 2017-03-25 ENCOUNTER — Telehealth: Payer: Self-pay | Admitting: Family Medicine

## 2017-03-25 NOTE — Telephone Encounter (Signed)
[facility] pharmacy requesting PA for percocet

## 2017-03-25 NOTE — Telephone Encounter (Signed)
Tried initiating PA via Covermymeds-unable due to outstanding PA which we have not started? I will have to call [employer] to clarify.

## 2017-03-25 NOTE — Telephone Encounter (Signed)
Copied from [place] [id number]. Topic: Quick Communication - See Telephone Encounter >> Mar 25, 2017  2:37 PM Vernona Rieger wrote: CRM for notification. See Telephone encounter for:   03/25/17.  [employer] needs a pre autho on oxyCODONE-acetaminophen (PERCOCET) 10-325 MG tablet   Call back is [phone]

## 2017-03-26 NOTE — Telephone Encounter (Signed)
Called [facility], spoke w/ Whitney- PA initiated, can take 24-48 hours to receive faxed determination. Reference number: [id number]. Awaiting determination.

## 2017-03-26 NOTE — Telephone Encounter (Signed)
PA approved through 03/04/2018. 

## 2017-04-15 ENCOUNTER — Telehealth: Payer: Self-pay | Admitting: Family Medicine

## 2017-04-15 DIAGNOSIS — G8929 Other chronic pain: Secondary | ICD-10-CM

## 2017-04-15 DIAGNOSIS — M545 Low back pain: Principal | ICD-10-CM

## 2017-04-15 NOTE — Telephone Encounter (Signed)
Copied from [place] [id number]. Topic: Quick Communication - Rx Refill/Question >> Apr 15, 2017  1:55 PM Scherrie Gerlach wrote: Medication: oxyCODONE-acetaminophen (PERCOCET) 10-325 MG tablet  [facility] [address] [place], [place] [phone] (Phone) [phone] (Fax)

## 2017-04-17 MED ORDER — OXYCODONE-ACETAMINOPHEN 10-325 MG PO TABS: ORAL_TABLET | ORAL | 0 refills | Status: DC

## 2017-04-17 NOTE — Telephone Encounter (Signed)
Rx approved. Please find out status of pain clinic referral. TY.

## 2017-04-17 NOTE — Telephone Encounter (Signed)
Referral has been received takes 4 to 6 weeks before appt

## 2017-04-22 ENCOUNTER — Encounter: Payer: Medicare HMO | Attending: Physical Medicine & Rehabilitation

## 2017-04-22 ENCOUNTER — Encounter: Payer: Self-pay | Admitting: Physical Medicine & Rehabilitation

## 2017-04-22 ENCOUNTER — Ambulatory Visit: Payer: Medicare HMO | Admitting: Physical Medicine & Rehabilitation

## 2017-04-22 VITALS — BP 151/105 | HR 110

## 2017-04-22 DIAGNOSIS — M5442 Lumbago with sciatica, left side: Secondary | ICD-10-CM

## 2017-04-22 DIAGNOSIS — M961 Postlaminectomy syndrome, not elsewhere classified: Secondary | ICD-10-CM | POA: Insufficient documentation

## 2017-04-22 DIAGNOSIS — I10 Essential (primary) hypertension: Secondary | ICD-10-CM | POA: Insufficient documentation

## 2017-04-22 DIAGNOSIS — Z981 Arthrodesis status: Secondary | ICD-10-CM | POA: Diagnosis not present

## 2017-04-22 DIAGNOSIS — Z5181 Encounter for therapeutic drug level monitoring: Secondary | ICD-10-CM | POA: Diagnosis not present

## 2017-04-22 DIAGNOSIS — Z9889 Other specified postprocedural states: Secondary | ICD-10-CM | POA: Diagnosis not present

## 2017-04-22 DIAGNOSIS — F17210 Nicotine dependence, cigarettes, uncomplicated: Secondary | ICD-10-CM | POA: Diagnosis not present

## 2017-04-22 DIAGNOSIS — G8929 Other chronic pain: Secondary | ICD-10-CM | POA: Diagnosis not present

## 2017-04-22 DIAGNOSIS — E119 Type 2 diabetes mellitus without complications: Secondary | ICD-10-CM | POA: Insufficient documentation

## 2017-04-22 DIAGNOSIS — M5441 Lumbago with sciatica, right side: Secondary | ICD-10-CM

## 2017-04-22 DIAGNOSIS — M545 Low back pain: Secondary | ICD-10-CM | POA: Insufficient documentation

## 2017-04-22 NOTE — Progress Notes (Signed)
Subjective:    Patient ID: Jeffrey Austin, male    DOB: 12/09/1960, 57 y.o.   MRN: [id number]  HPI Chief complaint of chronic low back pain radiating to both lower limbs 57 year old male originally from [place] but was working in [place] for 21 years when he was attacked by 2 pit bulls.  He states that since that time he has had severe low back pain.  He underwent L4-5 L5-S1 fusion anterior approach in [place] August 2016.  He had postoperative umbilical hernia which was repaired at [facility] in 2017.  He was evaluated by local neurosurgeon Dr. Christella Noa and lumbosacral myelogram was performed demonstrating no postsurgical complications Patient states that he has had postoperative physical therapy.  He had some spinal injections prior to surgery but not after surgery. He has been prescribed low-dose oxycodone by his primary care physician, reviewed PMP aware website.  Has been receiving medication from one prescriber. Moved to [facility] July 2017 Patient states that he drinks alcohol occasionally but does not specify exact amount. Pain Inventory Average Pain 8 Pain Right Now 8 My pain is tingling and aching  In the last 24 hours, has pain interfered with the following? General activity 9 Relation with others 9 Enjoyment of life 8 What TIME of day is your pain at its worst? daytime Sleep (in general) Fair  Pain is worse with: bending and sitting Pain improves with: medication Relief from Meds: 6  Mobility ability to climb steps?  yes do you drive?  yes  Function disabled: date disabled 2016  Neuro/Psych No problems in this area  Prior Studies Any changes since last visit?  no  Physicians involved in your care Any changes since last visit?  no   Family History  Problem Relation Age of Onset  . Leukemia Father    Social History   Socioeconomic History  . Marital status: Married    Spouse name: None  . Number of children: None  . Years of education: None  . Highest  education level: None  Social Needs  . Financial resource strain: None  . Food insecurity - worry: None  . Food insecurity - inability: None  . Transportation needs - medical: None  . Transportation needs - non-medical: None  Occupational History  . None  Tobacco Use  . Smoking status: Current Some Day Smoker  . Smokeless tobacco: Never Used  Substance and Sexual Activity  . Alcohol use: Yes    Alcohol/week: 0.0 oz    Comment: 3-4 times weekly  . Drug use: No  . Sexual activity: Yes  Other Topics Concern  . None  Social History Narrative  . None   Past Surgical History:  Procedure Laterality Date  . cyst removed from testicle     . INCISIONAL HERNIA REPAIR N/A 02/01/2016   Procedure: LAPAROSCOPIC INCISIONAL HERNIA REPAIR;  Surgeon: Arta Bruce Kinsinger, MD;  Location: [facility];  Service: General;  Laterality: N/A;  . INSERTION OF MESH N/A 02/01/2016   Procedure: INSERTION OF MESH;  Surgeon: Arta Bruce Kinsinger, MD;  Location: [facility];  Service: General;  Laterality: N/A;  . LUMBAR FUSION    . SPINAL FUSION     Past Medical History:  Diagnosis Date  . Back pain   . Diabetes mellitus without complication ([place])   . Hypertension    BP (!) 151/105 Comment: took BP meds 2 hrs ago  Pulse (!) 110   SpO2 97%   Opioid Risk Score:   Fall Risk Score:  `  1  Depression screen PHQ 2/9  No flowsheet data found.   Review of Systems  Constitutional: Negative.   HENT: Negative.   Eyes: Negative.   Respiratory: Negative.   Cardiovascular: Negative.   Gastrointestinal: Negative.   Endocrine: Negative.   Genitourinary: Negative.   Musculoskeletal: Positive for arthralgias, back pain, joint swelling and myalgias.  Skin: Negative.   Allergic/Immunologic: Negative.   Neurological: Negative.   Hematological: Negative.   Psychiatric/Behavioral: Negative.   All other systems reviewed and are negative.      Objective:   Physical Exam  Constitutional: He is oriented to person,  place, and time. He appears well-developed and well-nourished.  HENT:  Head: Normocephalic and atraumatic.  Eyes: Conjunctivae are normal. Pupils are equal, round, and reactive to light.  Neck: Normal range of motion.  Cardiovascular: Normal rate, regular rhythm and normal heart sounds.  No murmur heard. Pulmonary/Chest: Effort normal and breath sounds normal. No respiratory distress. He has no wheezes.  Abdominal: Soft. Bowel sounds are normal. He exhibits no distension. There is no tenderness.  Musculoskeletal:       Cervical back: Normal.       Thoracic back: Normal.       Lumbar back: He exhibits decreased range of motion and tenderness. He exhibits no deformity.  Neg SLR  No scoliosis  Tender at L4-5 bialterally  Neurological: He is alert and oriented to person, place, and time.  Skin: Skin is warm and dry.  Psychiatric: He has a normal mood and affect.  Nursing note and vitals reviewed. Neuro:  Eyes without evidence of nystagmus  Tone is normal without evidence of spasticity Cerebellar exam shows no evidence of ataxia on finger nose finger or heel to shin testing No evidence of trunkal ataxia  Motor strength is 5/5 in bilateral deltoid, biceps, triceps, finger flexors and extensors, wrist flexors and extensors, hip flexors, knee flexors and extensors, ankle dorsiflexors, plantar flexors, invertors and evertors, toe flexors and extensors Need encouragement for full effort secondary to pain  Sensory exam is normal to pinprick,and light touch in the upper and lower limbs   Gait normal          Assessment & Plan:  1.  Lumbar post lami syndrone s/p L4-5, 5-1 anterior fusion.  Has bilateral sciatic symptoms which have not changed or progressed over time.  Lumbar myelogram demonstrate no complicating features and pt has undergone neurosurg re eval in 2017 No new imaging needed athis time Discussed interventional pain procedures including caudal ESA, Medial branch blocks and  SI injections under fluoro.  Given predominence of sciatic symptoms would start with caudal ESI  We discussed narcotic analgesic policies of clinic including monthly visits for schedule 2 meds .  Pt feels like that is too much driving from [employer].  Pt states he plans to continue narcotic management with PCP

## 2017-04-22 NOTE — Patient Instructions (Addendum)
Caudal Epidural injection  Lumbar medial branch block L2,3,4  Recumbent bicycle and walking recommended  Spinal cord stim may be beneficial  Rec no alcohol with pain meds, as discussed our clinic would require monthly visits if prescribing oxycodone and a urine screen that is free of alcohol of orther substances

## 2017-04-22 NOTE — Progress Notes (Signed)
UDS discontinued by doctor Kirsteins Instructions.

## 2017-05-13 ENCOUNTER — Encounter: Payer: Self-pay | Admitting: Physical Medicine & Rehabilitation

## 2017-05-13 ENCOUNTER — Encounter: Payer: Medicare HMO | Attending: Physical Medicine & Rehabilitation

## 2017-05-13 ENCOUNTER — Ambulatory Visit: Payer: Medicare HMO | Admitting: Physical Medicine & Rehabilitation

## 2017-05-13 VITALS — BP 156/103 | HR 98

## 2017-05-13 DIAGNOSIS — Z981 Arthrodesis status: Secondary | ICD-10-CM | POA: Diagnosis not present

## 2017-05-13 DIAGNOSIS — Z9889 Other specified postprocedural states: Secondary | ICD-10-CM | POA: Insufficient documentation

## 2017-05-13 DIAGNOSIS — M545 Low back pain: Secondary | ICD-10-CM | POA: Insufficient documentation

## 2017-05-13 DIAGNOSIS — E119 Type 2 diabetes mellitus without complications: Secondary | ICD-10-CM | POA: Insufficient documentation

## 2017-05-13 DIAGNOSIS — M961 Postlaminectomy syndrome, not elsewhere classified: Secondary | ICD-10-CM

## 2017-05-13 DIAGNOSIS — G8929 Other chronic pain: Secondary | ICD-10-CM | POA: Diagnosis not present

## 2017-05-13 DIAGNOSIS — F17210 Nicotine dependence, cigarettes, uncomplicated: Secondary | ICD-10-CM | POA: Diagnosis not present

## 2017-05-13 DIAGNOSIS — I10 Essential (primary) hypertension: Secondary | ICD-10-CM | POA: Diagnosis not present

## 2017-05-13 DIAGNOSIS — M5416 Radiculopathy, lumbar region: Secondary | ICD-10-CM

## 2017-05-13 MED ORDER — DIAZEPAM 10 MG PO TABS: 10.0000 mg | ORAL_TABLET | Freq: Once | ORAL | 0 refills | Status: AC

## 2017-05-13 NOTE — Progress Notes (Signed)
Caudal epidural injection Informed consent was obtained after describing risks and benefits of the procedure the patient is include bleeding bruising and infection she elects to proceed and has given written consent. Patient placed prone on fluoroscopy table Betadine prep Sterile drape 25-gauge 1.5 inch needle was used to anesthetize the skin and subcutaneous tissue after identifying the sacral hiatus on fluoroscopic AP views Then a 22-gauge 1.5 inch needle was inserted into the sacral hiatus. Lateral views were utilized to maneuver the needle into the sacral canal Under AP views 3 cc of Omnipaque 180 injected demonstrating appropriate spread of injectate Then a solution containing 12 mg of Celestone and 3 cc of 1% MPF of lidocaine were injected. Patient tolerated procedure well Post procedure instructions given

## 2017-05-13 NOTE — Patient Instructions (Signed)

## 2017-05-13 NOTE — Progress Notes (Signed)
  PROCEDURE RECORD [facility]   Name: Jeffrey Austin DOB:[birth year] MRN: [id number]  Date:05/13/2017  Physician: Alysia Penna, MD    Nurse/CMA: Mayana Irigoyen CMA  Allergies: No Known Allergies  Consent Signed: Yes.    Is patient diabetic? No.  CBG today? NA  Pregnant: No. LMP: No LMP for male patient. (age 56-55)  Anticoagulants: no Anti-inflammatory: no Antibiotics: no  Procedure:  Caudal ESI         Position: Prone   Start Time:  138pm  End Time:  158pm  Fluoro Time:  36s  RN/CMA Phillippe Orlick CMA Loralie Malta CMA    Time 123pm 205pm    BP 156/103 157/107    Pulse 98 88    Respirations 16 16    O2 Sat 98 97    S/S 6 6    Pain Level 8/10 5/10     D/C home with wife, [name] & O X 3, D/C instructions reviewed, and sits independently.       j

## 2017-05-14 ENCOUNTER — Telehealth: Payer: Self-pay | Admitting: Family Medicine

## 2017-05-14 NOTE — Telephone Encounter (Signed)
Percocet  Refill  Request  LOV  03/18/2017   [place]  [place]  [place]

## 2017-05-14 NOTE — Telephone Encounter (Signed)
Copied from [place] [id number]. Topic: Quick Communication - See Telephone Encounter >> May 14, 2017  2:27 PM Conception Chancy, NT wrote: CRM for notification. See Telephone encounter for:  05/14/17.  Patient is calling and requesting a refill on oxyCODONE-acetaminophen (PERCOCET) 10-325 MG tablet. Please advise.  [facility] [address] [place], [place] - [address]  [address] [place] [address]  Phone: [phone] Fax: [phone]

## 2017-05-15 NOTE — Telephone Encounter (Signed)
I thought he was seeing a specialist for this issue?

## 2017-05-15 NOTE — Telephone Encounter (Signed)
Called left message to call back 

## 2017-05-16 ENCOUNTER — Telehealth: Payer: Self-pay

## 2017-05-16 DIAGNOSIS — G8929 Other chronic pain: Secondary | ICD-10-CM

## 2017-05-16 DIAGNOSIS — M545 Low back pain: Principal | ICD-10-CM

## 2017-05-16 MED ORDER — OXYCODONE-ACETAMINOPHEN 10-325 MG PO TABS: ORAL_TABLET | ORAL | 0 refills | Status: DC

## 2017-05-16 NOTE — Telephone Encounter (Signed)
Copied from [place] [phone]. Topic: Quick Communication - See Telephone Encounter >> May 14, 2017  2:27 PM Conception Chancy, NT wrote: CRM for notification. See Telephone encounter for:  05/14/17. >> May 15, 2017  5:13 PM Percell Belt A wrote: Pt stated that he got injections Monday and it is only working on the right side but not the left.  Pt is still in pain.  He stated he only has 2 pill left and is going back to see Dr about back in the middle of April (3 weeks from Monday)

## 2017-05-16 NOTE — Telephone Encounter (Signed)
Copied from [place] [phone]. Topic: Quick Communication - See Telephone Encounter >> May 14, 2017  2:27 PM Conception Chancy, NT wrote: CRM for notification. See Telephone encounter for:  05/14/17. >> May 15, 2017  5:13 PM Percell Belt A wrote: Pt stated that he got injections Monday and it is only working on the right side but not the left.  Pt is still in pain.  He stated he only has 2 pill left and is going back to see Dr about back in the middle of April (3 weeks from Monday)

## 2017-05-16 NOTE — Telephone Encounter (Signed)
Called informed the patient refill done/future refills need to be done by specialist. He did verbalize understanding.

## 2017-05-16 NOTE — Telephone Encounter (Signed)
See telephone note PCP did refill for one more month---but patient was informed by PCP instructions further refills have to be taken care of by his specialist.

## 2017-05-16 NOTE — Telephone Encounter (Signed)
I will refill one more month. He is to get future refills from his specialist. TY.

## 2017-05-16 NOTE — Telephone Encounter (Signed)
Called left message to call back 

## 2017-05-16 NOTE — Addendum Note (Signed)
Addended by: Ames Coupe on: 05/16/2017 08:34 AM   Modules accepted: Orders

## 2017-06-09 IMAGING — CT CT L SPINE W/ CM
3 of 4 series · 10 of 33 positions shown, 11 images · non-contrast
Comparison: Noncontrast lumbar CT 07/05/2015.

CLINICAL DATA: Low back pain.  BILATERAL leg pain.
TECHNIQUE: Contiguous axial images were obtained through the Lumbar spine after
the intrathecal infusion of infusion. Coronal and sagittal
reconstructions were obtained of the axial image sets.

[Series 4: l-spine 2.0 st · axial · 0.30mm/px · z∈[+938,+1070]mm · 2 of 155 slices shown, 3 images]
[im 45/155  soft-tissue]
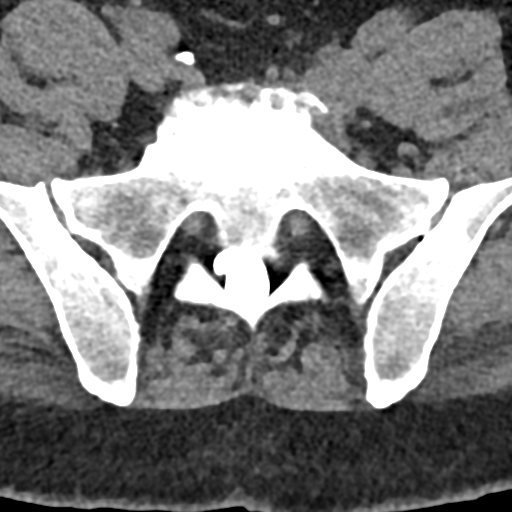
[im 45/155  bone]
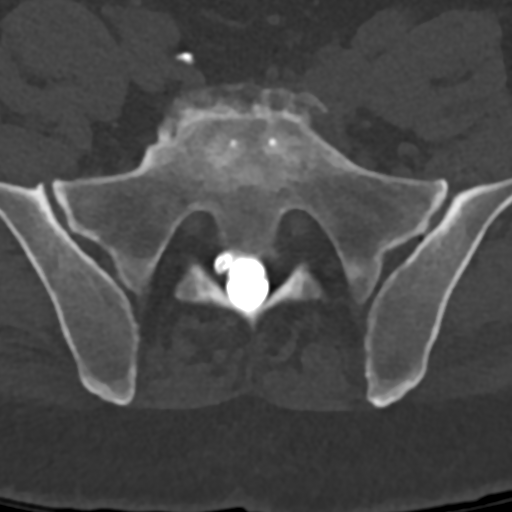
[im 111/155  bone]
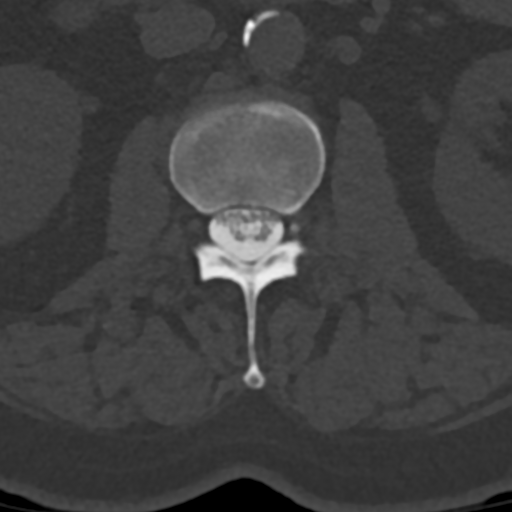

[Series 8: l-spine 2.0 cor bone · coronal · 0.29mm/px · 3 of 73 slices shown]
[im 15/73  bone]
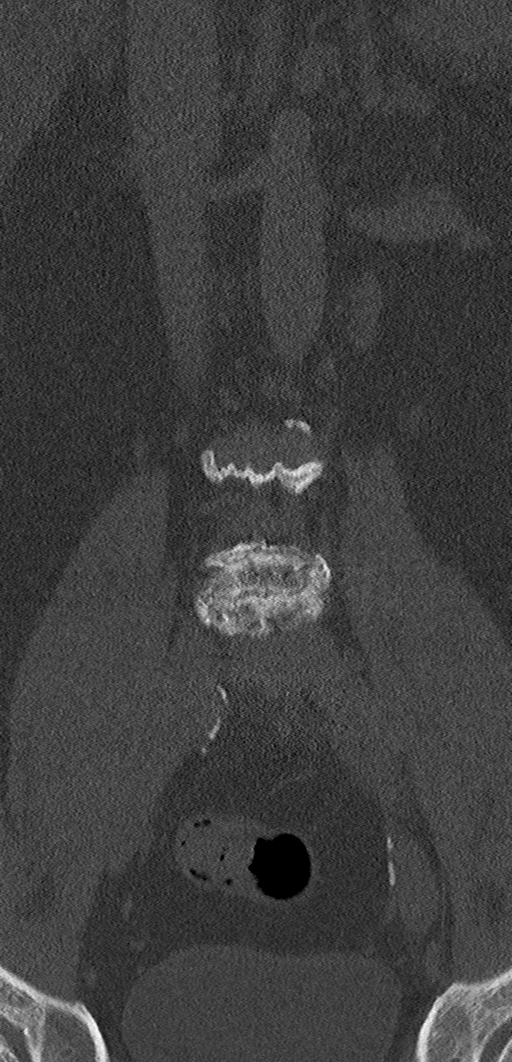
[im 29/73  bone]
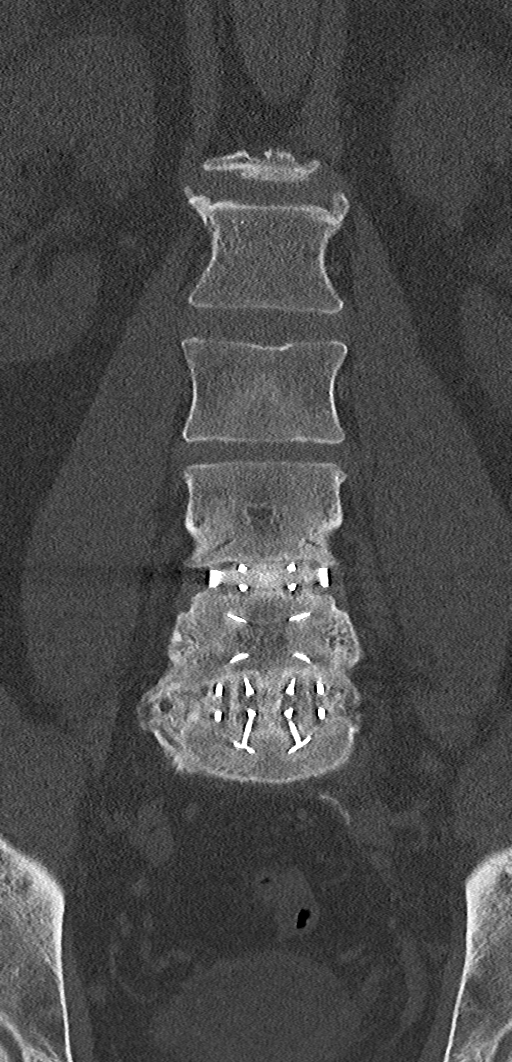
[im 44/73  bone]
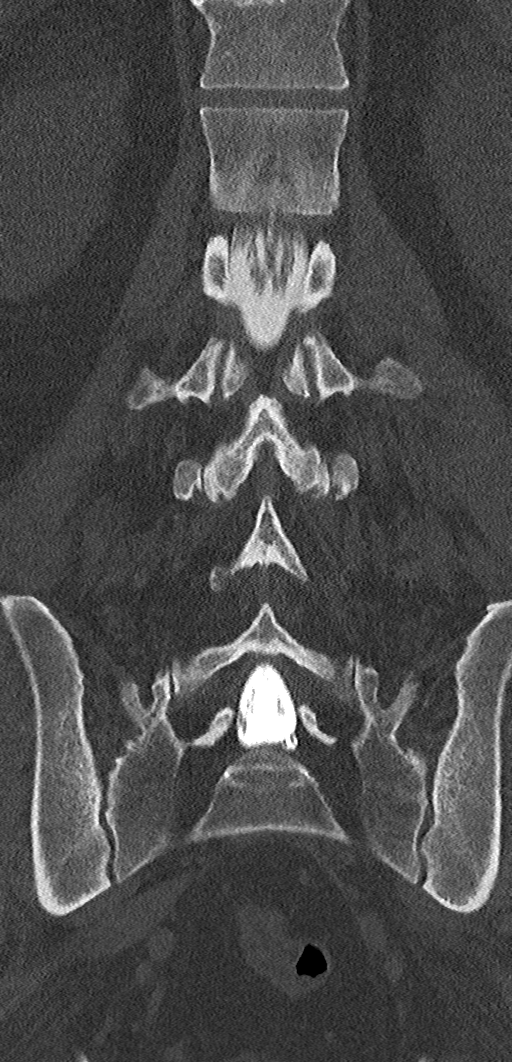

[Series 9: l-spine 2.0 sag bone · sagittal · 0.30mm/px · 5 of 61 slices shown]
[im 21/61  bone]
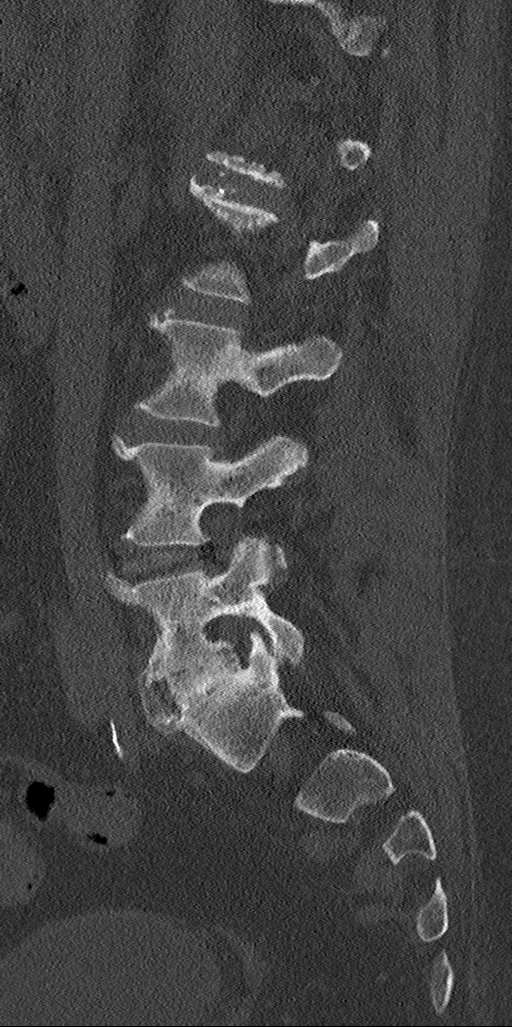
[im 26/61  bone]
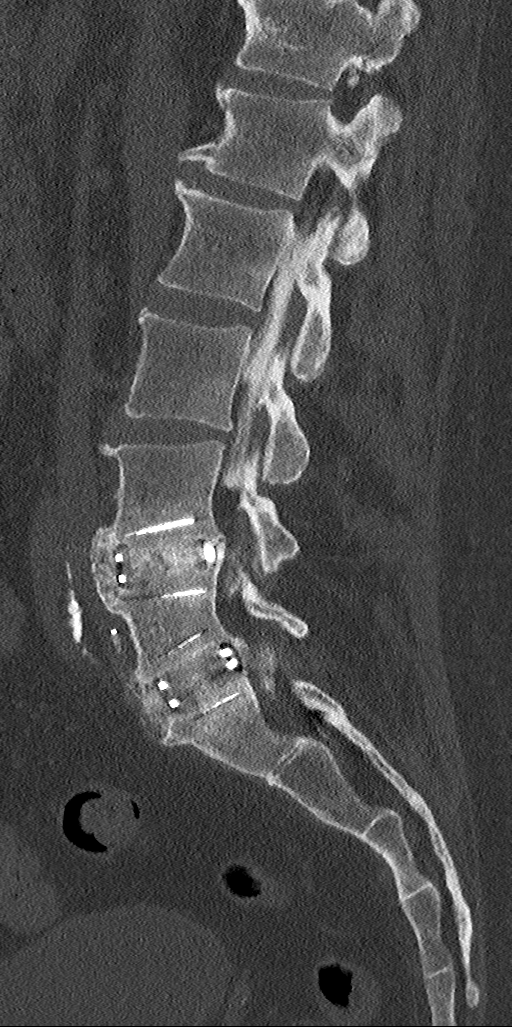
[im 31/61  bone]
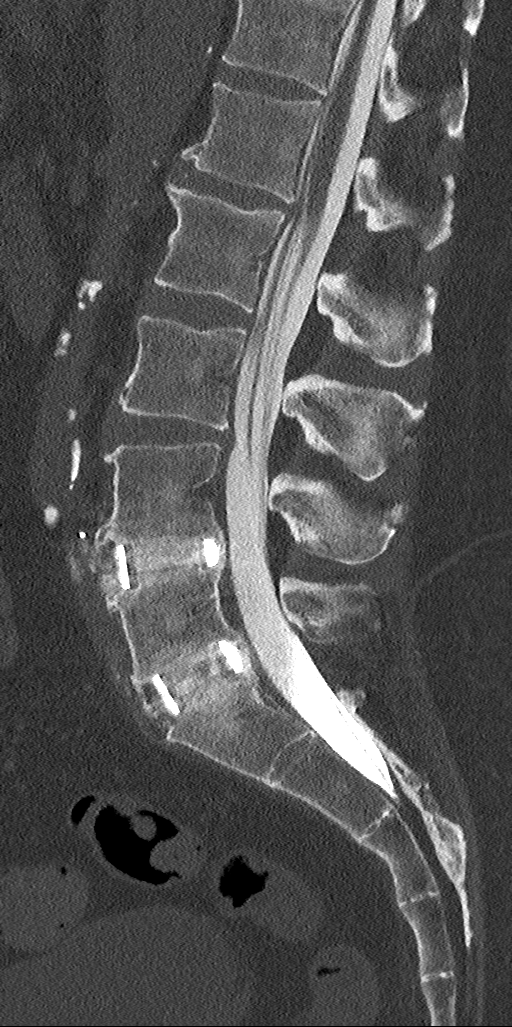
[im 36/61  bone]
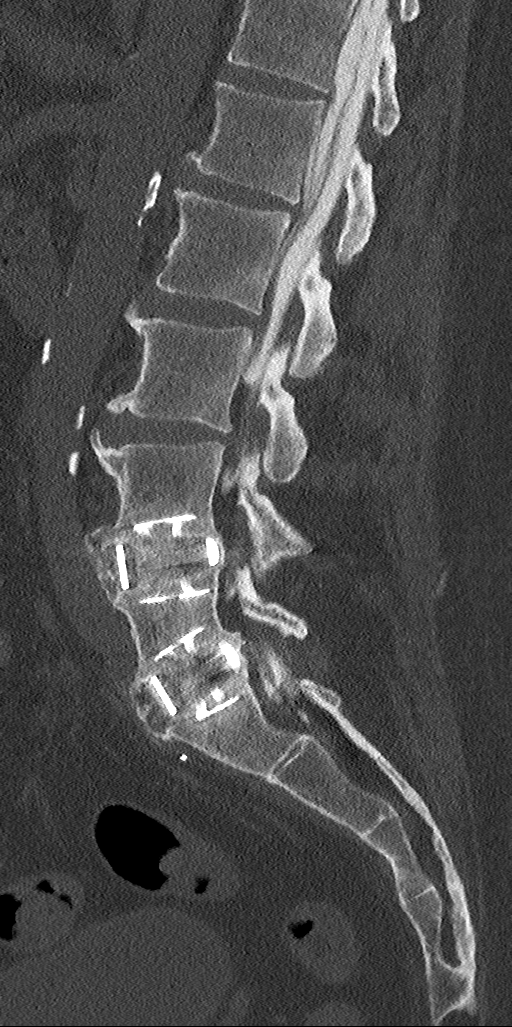
[im 41/61  bone]
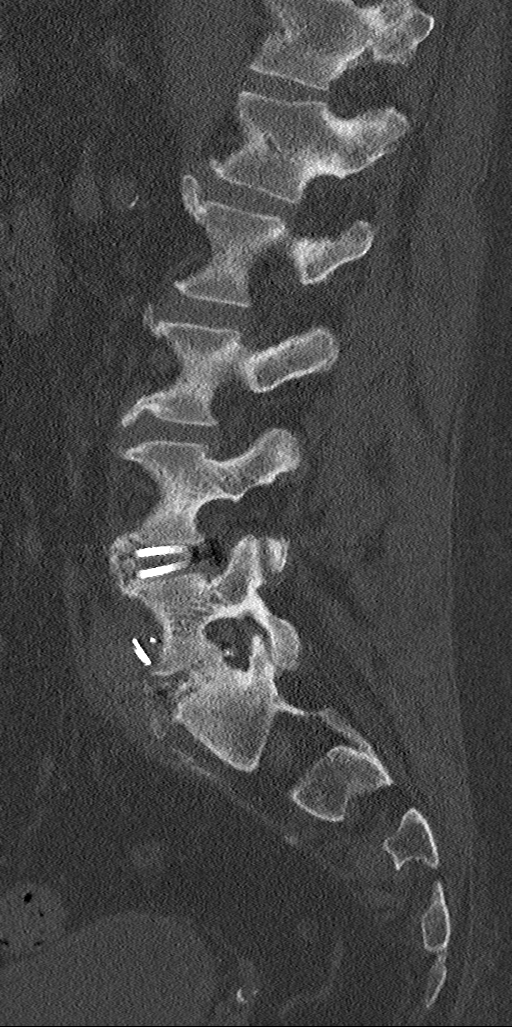

[10 of 33 positions shown; findings below may reference images not displayed]

EXAM:
LUMBAR MYELOGRAM

FLUOROSCOPY TIME:  Minutes corresponding to a Dose Area Product of
1536.18 ?Gy*m2

PROCEDURE:
Informed consent was obtained by the ordering provider.

Lumbar puncture, performed at L4-5, and intrathecal contrast
administration were performed by Dr. Lupillo who will separately
report for the portion of the procedure. I personally supervised
acquisition of the myelogram images.
FINDINGS: LUMBAR MYELOGRAM FINDINGS:

Good opacification lumbar subarachnoid space. Previous L4-5 and
L5-S1 anterior lumbar interbody fusion fusion appears solid.
Interbody cages are centered within the interspace. There is
anterior bony bridging across the L4-5 interspace. Surgical clips.
Vascular calcification.

Shallow ventral defects at L1-2 and L3-4 without stenosis or nerve
root cut off. No features suggestive of arachnoiditis.

CT LUMBAR MYELOGRAM FINDINGS:

Segmentation: Normal.

Alignment:  Normal.

Vertebrae: No worrisome osseous lesion.

Conus medullaris: Slightly low termination upper L2. Normal size and
location.

Paraspinal tissues: Aortic atherosclerosis without aneurysmal
dilatation.

Disc levels:

L1-L2: Annular bulge. Osseous spurring anteriorly. No impingement.

L2-L3:  Unremarkable.

L3-L4: Annular bulge. Mild facet arthropathy. Osseous spurring
anteriorly. No stenosis, subarticular zone, or foraminal zone
narrowing.

L4-L5:  Solid interbody arthrodesis.  No impingement.

L5-S1:  Solid interbody arthrodesis.  No impingement.
IMPRESSION: LUMBAR MYELOGRAM IMPRESSION:

Solid L4 through S1 anterior lumbar interbody fusion. No adjacent
segment disease is evident. Anatomic alignment.

CT LUMBAR MYELOGRAM IMPRESSION:

Solid appearing L4-5 and L5-S1 anterior lumbar interbody fusion
without postoperative complication or residual impingement.

Annular bulge L3-4 with mild facet arthropathy, but no findings
concerning for adjacent segment disease.

## 2017-06-10 ENCOUNTER — Encounter: Payer: Self-pay | Admitting: Physical Medicine & Rehabilitation

## 2017-06-10 ENCOUNTER — Ambulatory Visit: Payer: Medicare HMO | Admitting: Physical Medicine & Rehabilitation

## 2017-06-10 ENCOUNTER — Encounter: Payer: Medicare HMO | Attending: Physical Medicine & Rehabilitation

## 2017-06-10 VITALS — BP 148/100 | HR 84 | Ht 70.0 in | Wt 203.0 lb

## 2017-06-10 DIAGNOSIS — Z79899 Other long term (current) drug therapy: Secondary | ICD-10-CM | POA: Diagnosis not present

## 2017-06-10 DIAGNOSIS — F17210 Nicotine dependence, cigarettes, uncomplicated: Secondary | ICD-10-CM | POA: Diagnosis not present

## 2017-06-10 DIAGNOSIS — E119 Type 2 diabetes mellitus without complications: Secondary | ICD-10-CM | POA: Insufficient documentation

## 2017-06-10 DIAGNOSIS — Z9889 Other specified postprocedural states: Secondary | ICD-10-CM | POA: Insufficient documentation

## 2017-06-10 DIAGNOSIS — Z981 Arthrodesis status: Secondary | ICD-10-CM | POA: Insufficient documentation

## 2017-06-10 DIAGNOSIS — Z5181 Encounter for therapeutic drug level monitoring: Secondary | ICD-10-CM

## 2017-06-10 DIAGNOSIS — G8929 Other chronic pain: Secondary | ICD-10-CM | POA: Diagnosis not present

## 2017-06-10 DIAGNOSIS — M545 Low back pain: Secondary | ICD-10-CM | POA: Diagnosis not present

## 2017-06-10 DIAGNOSIS — M961 Postlaminectomy syndrome, not elsewhere classified: Secondary | ICD-10-CM | POA: Insufficient documentation

## 2017-06-10 DIAGNOSIS — I10 Essential (primary) hypertension: Secondary | ICD-10-CM | POA: Diagnosis not present

## 2017-06-10 DIAGNOSIS — M5416 Radiculopathy, lumbar region: Secondary | ICD-10-CM | POA: Diagnosis not present

## 2017-06-10 NOTE — Progress Notes (Signed)
  PROCEDURE RECORD [facility]   Name: Jeffrey Austin DOB:1960-04-26 MRN: [id number]  Date:06/10/2017  Physician: Alysia Penna, MD    Nurse/CMA: Bright CMA  Allergies: No Known Allergies  Consent Signed: Yes.    Is patient diabetic? Yes.    CBG today? NA  Pregnant: No. LMP: No LMP for male patient. (age 56-55)  Anticoagulants: no Anti-inflammatory: no Antibiotics: no  Procedure: Bilateral Transforminal  ESI Position: Prone   Start Time: 139pm     End Time: 144pm     Fluoro Time:  31s     RN/CMA Bright CMA Bright CMA    Time 115pm 151pm    BP 148/100 145/105    Pulse 84 87    Respirations 16 16    O2 Sat 98 97    S/S 6 6    Pain Level 5/10 3/10     D/C home with WIFE,  [name] & O X 3, D/C instructions reviewed, and sits independently.

## 2017-06-10 NOTE — Patient Instructions (Addendum)
You received an epidural steroid injection under fluoroscopic guidance. This is the most accurate way to perform an epidural injection. This injection was performed to relieve thigh or leg or foot pain that may be related to a pinched nerve in the lumbar spine. The local anesthetic injected today may cause numbness in your leg for a couple hours. If it is severe we may need to observe you for 30-60 minutes after the injection. The cortisone medicine injected today may take several days to take full effect. This medicine can also cause facial flushing or feeling of being warm.  This injection may last for days weeks or months. It can be repeated if needed. If it is not effective, another spinal level may need to be injected. Other treatments include medication management as well as physical therapy. In some cases surgery may be an option.  Will check Urine screen , if this looks ok we can start prescribing oxycodone

## 2017-06-15 LAB — TOXASSURE SELECT,+ANTIDEPR,UR

## 2017-06-17 ENCOUNTER — Telehealth: Payer: Self-pay | Admitting: *Deleted

## 2017-06-17 ENCOUNTER — Ambulatory Visit: Payer: Medicare HMO | Admitting: Family Medicine

## 2017-06-17 ENCOUNTER — Telehealth: Payer: Self-pay

## 2017-06-17 DIAGNOSIS — M545 Low back pain: Principal | ICD-10-CM

## 2017-06-17 DIAGNOSIS — G8929 Other chronic pain: Secondary | ICD-10-CM

## 2017-06-17 NOTE — Telephone Encounter (Signed)
Urine drug screen is appropriate for this encounter. He was prescribed percocet #60 05/16/17 and bupropion by his primary and a diazepam for his procedure. Per your note you would assume prescribing if UDS consistent.

## 2017-06-17 NOTE — Telephone Encounter (Signed)
Patient called today requesting prescription for oxycodone

## 2017-06-18 ENCOUNTER — Other Ambulatory Visit: Payer: Self-pay | Admitting: Physical Medicine & Rehabilitation

## 2017-06-18 MED ORDER — OXYCODONE-ACETAMINOPHEN 10-325 MG PO TABS: ORAL_TABLET | ORAL | 0 refills | Status: DC

## 2017-06-18 NOTE — Telephone Encounter (Signed)
Patient called again today requesting pain medication for his pain since he completed his UDS

## 2017-06-20 ENCOUNTER — Ambulatory Visit (INDEPENDENT_AMBULATORY_CARE_PROVIDER_SITE_OTHER): Payer: Medicare HMO | Admitting: Family Medicine

## 2017-06-20 ENCOUNTER — Encounter: Payer: Self-pay | Admitting: Family Medicine

## 2017-06-20 VITALS — BP 140/92 | HR 93 | Temp 98.9°F | Ht 70.0 in | Wt 201.5 lb

## 2017-06-20 DIAGNOSIS — Z114 Encounter for screening for human immunodeficiency virus [HIV]: Secondary | ICD-10-CM | POA: Diagnosis not present

## 2017-06-20 DIAGNOSIS — I10 Essential (primary) hypertension: Secondary | ICD-10-CM | POA: Diagnosis not present

## 2017-06-20 DIAGNOSIS — Z1159 Encounter for screening for other viral diseases: Secondary | ICD-10-CM | POA: Diagnosis not present

## 2017-06-20 DIAGNOSIS — E119 Type 2 diabetes mellitus without complications: Secondary | ICD-10-CM | POA: Diagnosis not present

## 2017-06-20 LAB — COMPREHENSIVE METABOLIC PANEL
ALK PHOS: 53 U/L (ref 39–117)
ALT: 33 U/L (ref 0–53)
AST: 21 U/L (ref 0–37)
Albumin: 4 g/dL (ref 3.5–5.2)
BUN: 8 mg/dL (ref 6–23)
CHLORIDE: 99 meq/L (ref 96–112)
CO2: 29 mEq/L (ref 19–32)
Calcium: 9.8 mg/dL (ref 8.4–10.5)
Creatinine, Ser: 0.86 mg/dL (ref 0.40–1.50)
GFR: 97.86 mL/min (ref >60.00)
Glucose, Bld: 137 mg/dL — ABNORMAL HIGH (ref 70–99)
POTASSIUM: 3.9 meq/L (ref 3.5–5.1)
SODIUM: 136 meq/L (ref 135–145)
Total Bilirubin: 0.7 mg/dL (ref 0.2–1.2)
Total Protein: 6.9 g/dL (ref 6.0–8.3)

## 2017-06-20 LAB — LIPID PANEL
CHOLESTEROL: 140 mg/dL (ref 0–200)
HDL: 51.1 mg/dL (ref >39.00)
LDL CALC: 66 mg/dL (ref 0–99)
NonHDL: 88.9
TRIGLYCERIDES: 113 mg/dL (ref 0.0–149.0)
Total CHOL/HDL Ratio: 3
VLDL: 22.6 mg/dL (ref 0.0–40.0)

## 2017-06-20 LAB — HEMOGLOBIN A1C: HEMOGLOBIN A1C: 6.7 % — AB (ref 4.6–6.5)

## 2017-06-20 MED ORDER — ATORVASTATIN CALCIUM 40 MG PO TABS: 40.0000 mg | ORAL_TABLET | Freq: Every day | ORAL | 3 refills | Status: DC

## 2017-06-20 MED ORDER — GLIPIZIDE 5 MG PO TABS: 5.0000 mg | ORAL_TABLET | Freq: Two times a day (BID) | ORAL | 5 refills | Status: DC

## 2017-06-20 MED ORDER — LOSARTAN POTASSIUM-HCTZ 100-25 MG PO TABS: 1.0000 | ORAL_TABLET | Freq: Every day | ORAL | 3 refills | Status: DC

## 2017-06-20 NOTE — Patient Instructions (Signed)
Give [place] 2-3 business days to get the results of your labs back. If labs are normal, you will likely receive a letter in the mail unless you have MyChart. This can take longer than 2-3 business days.   Think about the pneumonia shot.  Keep the diet clean, stay active.

## 2017-06-20 NOTE — Progress Notes (Signed)
Subjective:   Chief Complaint  Patient presents with  . Follow-up    Jeffrey Austin is a 56 y.o. male here for follow-up of diabetes.   Jeffrey Austin He checks his glucose levels 0 times per day- glucometer is not working. Patient does not require insulin.    Medications include: Metformin 1000 mg bid, was started on glipizide 5 mg bid by other provider before establishing back here. Patient exercises 7 days per week on average.   He has never had an eye exam. Has not had a foot exam. Has never had PCV23 and refuses influenza.  Statin? No ACEi/ARB? Yes  Hypertension Patient presents for hypertension follow up. He does monitor home blood pressures. He is compliant with medication- losartan 100 mg/d. Patient has these side effects of medication: none He is adhering to a healthy diet overall. Exercise: walking Reports White Coat Syndrome  Past Medical History:  Diagnosis Date  . Back pain   . Diabetes mellitus without complication ([place])   . Hypertension     Past Surgical History:  Procedure Laterality Date  . cyst removed from testicle     . INCISIONAL HERNIA REPAIR N/A 02/01/2016   Procedure: LAPAROSCOPIC INCISIONAL HERNIA REPAIR;  Surgeon: Arta Bruce Kinsinger, MD;  Location: [facility];  Service: General;  Laterality: N/A;  . INSERTION OF MESH N/A 02/01/2016   Procedure: INSERTION OF MESH;  Surgeon: Arta Bruce Kinsinger, MD;  Location: [facility];  Service: General;  Laterality: N/A;  . LUMBAR FUSION    . SPINAL FUSION        Current Outpatient Medications on File Prior to Visit  Medication Sig Dispense Refill  . losartan (COZAAR) 100 MG tablet Take 1 tablet (100 mg total) by mouth daily. 90 tablet 0  . metFORMIN (GLUCOPHAGE) 1000 MG tablet Take 1 tablet (1,000 mg total) by mouth 2 (two) times daily with a meal. 60 tablet 3  . oxyCODONE-acetaminophen (PERCOCET) 10-325 MG tablet Take 1 tab every 12 hours as needed for moderate-severe pain. 60 tablet 0   No Known  Allergies  Related testing: Foot exam(monofilament and inspection):done Date of retinal exam: Never had Pneumovax: refuses  Review of Systems: Eye:  No recent significant change in vision Pulmonary:  No SOB Cardiovascular:  No chest pain, no palpitations Skin/Integumentary ROS:  No abnormal skin lesions reported Neurologic:  No numbness, tingling  Objective:  BP (!) 140/92 (BP Location: Left Arm, Patient Position: Sitting, Cuff Size: Large)   Pulse 93   Temp 98.9 F (37.2 C) (Oral)   Ht 5\' 10"  (1.778 m)   Wt 201 lb 8 oz (91.4 kg)   SpO2 99%   BMI 28.91 kg/m  General:  Well developed, well nourished, in no apparent distress Skin:  Warm, no pallor or diaphoresis Head:  Normocephalic, atraumatic Eyes:  Pupils equal and round, sclera anicteric without injection  Nose:  External nares without trauma, no discharge Throat/Pharynx:  Lips and gingiva without lesion Neck: Neck supple.  No obvious thyromegaly or masses.  No bruits Lungs:  clear to auscultation, breath sounds equal bilaterally, no wheezes, rales, or stridor Cardio:  regular rate and rhythm without murmurs, no bruits, no LE edema Abdomen:  Abdomen soft, non-tender, BS normal Musculoskeletal:  Symmetrical muscle groups noted without atrophy or deformity Neuro:  Sensation intact to pinprick on feet Psych: Age appropriate judgment and insight, flat affect  Assessment:   Type 2 diabetes mellitus without complication, without long-term current use of insulin (HCC) - Plan: Comprehensive metabolic panel, Lipid  panel, Hemoglobin A1c, Ambulatory referral to Ophthalmology  Screening for HIV (human immunodeficiency virus) - Plan: HIV antibody  Encounter for hepatitis C screening test for low risk patient - Plan: Hepatitis C antibody   Plan:   Orders as above. Change Hyzaar from Cozaar.  Start statin. Refer to ophth.  New glucometer today.  Refuses flu, will think about PCV23.  F/u in 3 mo. The patient voiced  understanding and agreement to the plan.  [place], DO 06/20/17 12:06 PM

## 2017-06-20 NOTE — Progress Notes (Signed)
Pre visit review using our clinic review tool, if applicable. No additional management support is needed unless otherwise documented below in the visit note. 

## 2017-06-21 LAB — HEPATITIS C ANTIBODY
Hepatitis C Ab: NONREACTIVE
SIGNAL TO CUT-OFF: 0.01 (ref <1.00)

## 2017-06-21 LAB — HIV ANTIBODY (ROUTINE TESTING W REFLEX): HIV: NONREACTIVE

## 2017-07-03 ENCOUNTER — Other Ambulatory Visit: Payer: Self-pay | Admitting: Family Medicine

## 2017-07-03 MED ORDER — GLIPIZIDE 5 MG PO TABS: 5.0000 mg | ORAL_TABLET | Freq: Two times a day (BID) | ORAL | 2 refills | Status: DC

## 2017-07-03 MED ORDER — METFORMIN HCL 1000 MG PO TABS: 1000.0000 mg | ORAL_TABLET | Freq: Two times a day (BID) | ORAL | 2 refills | Status: DC

## 2017-07-03 NOTE — Telephone Encounter (Signed)
Copied from [place] [id number]. Topic: Quick Communication - Rx Refill/Question >> Jul 03, 2017  3:15 PM Selinda Flavin B, NT wrote: Medication: glipiZIDE (GLUCOTROL) 5 MG tablet    &    metFORMIN (GLUCOPHAGE) 1000 MG tablet Has the patient contacted their pharmacy? Yes.   (Agent: If no, request that the patient contact the pharmacy for the refill.) Preferred Pharmacy (with phone number or street name): [facility], [place]: Please be advised that RX refills may take up to 3 business days. We ask that you follow-up with your pharmacy.

## 2017-07-12 ENCOUNTER — Encounter: Payer: Medicare HMO | Attending: Physical Medicine & Rehabilitation

## 2017-07-12 ENCOUNTER — Ambulatory Visit: Payer: Medicare HMO | Admitting: Physical Medicine & Rehabilitation

## 2017-07-12 VITALS — BP 141/97 | HR 97 | Ht 70.0 in | Wt 207.2 lb

## 2017-07-12 DIAGNOSIS — M961 Postlaminectomy syndrome, not elsewhere classified: Secondary | ICD-10-CM | POA: Insufficient documentation

## 2017-07-12 DIAGNOSIS — F17210 Nicotine dependence, cigarettes, uncomplicated: Secondary | ICD-10-CM | POA: Insufficient documentation

## 2017-07-12 DIAGNOSIS — Z981 Arthrodesis status: Secondary | ICD-10-CM | POA: Diagnosis not present

## 2017-07-12 DIAGNOSIS — E119 Type 2 diabetes mellitus without complications: Secondary | ICD-10-CM | POA: Insufficient documentation

## 2017-07-12 DIAGNOSIS — M545 Low back pain: Secondary | ICD-10-CM | POA: Diagnosis not present

## 2017-07-12 DIAGNOSIS — Z9889 Other specified postprocedural states: Secondary | ICD-10-CM | POA: Diagnosis not present

## 2017-07-12 DIAGNOSIS — M533 Sacrococcygeal disorders, not elsewhere classified: Secondary | ICD-10-CM

## 2017-07-12 DIAGNOSIS — G8929 Other chronic pain: Secondary | ICD-10-CM | POA: Insufficient documentation

## 2017-07-12 DIAGNOSIS — I10 Essential (primary) hypertension: Secondary | ICD-10-CM | POA: Insufficient documentation

## 2017-07-12 MED ORDER — OXYCODONE-ACETAMINOPHEN 10-325 MG PO TABS: ORAL_TABLET | ORAL | 0 refills | Status: DC

## 2017-07-12 NOTE — Patient Instructions (Signed)
Sacroiliac injection was performed today. A combination of a numbing medicine plus a cortisone medicine was injected. The injection was done under x-ray guidance with contrast enhancement. This procedure has been performed to help reduce low back and buttocks pain as well as potentially hip pain. The duration of this injection is variable lasting from hours to  Months. It may repeated if needed. 

## 2017-07-12 NOTE — Progress Notes (Signed)
Right sacroiliac injection under fluoroscopic guidance  Indication: Right Low back and buttocks pain not relieved by medication management and other conservative care.  Informed consent was obtained after describing risks and benefits of the procedure with the patient, this includes bleeding, bruising, infection, paralysis and medication side effects. The patient wishes to proceed and has given written consent. The patient was placed in a prone position. The lumbar and sacral area was marked and prepped with Betadine. A 25-gauge 1-1/2 inch needle was inserted into the skin and subcutaneous tissue and 1 mL of 1% lidocaine was injected. Then a 25-gauge 3 inch spinal needle was inserted under fluoroscopic guidance into the Right sacroiliac joint. AP and lateral images were utilized. Omnipaque 180x0.5 mL under live fluoroscopy demonstrated no intravascular uptake. Then a solution containing one ML of 6 mgper ml betamethasone and 2 ML of 1% lidocaine MPF was injected x1.5 mL. Patient tolerated the procedure well. Post procedure instructions were given. Please see post procedure form.  Pre injection 8/10 Post injection 6/10  MD visit in 1 month  Rx for Oxy IR 10mg  #70 may take an extra tab on days with increased activity

## 2017-07-12 NOTE — Progress Notes (Signed)
  PROCEDURE RECORD [facility]   Name: Jeffrey Austin DOB:27-May-1960 MRN: [id number]  Date:07/12/2017  Physician: Alysia Penna, MD    Nurse/CMA: Lee,CMA/  Allergies: No Known Allergies  Consent Signed: Yes.    Is patient diabetic? Yes.    CBG today? Did not check blood sugar today, pt states he does not check blood sugar at home  Pregnant: No. LMP: No LMP for male patient. (age 56-55)  Anticoagulants: no Anti-inflammatory: no Antibiotics: no  Procedure: Right Sacroiliac  Position: Prone   Start Time: 147pm End Time: 150pm Fluoro Time: 17s  RN/CMA Lee/CMA Bright CMA    Time 1:08 157pm    BP 141/97 157/99    Pulse 98 91    Respirations 14 16    O2 Sat 98 98    S/S 6 6    Pain Level 8/10 6/10     D/C home with wife,[name] & O X 3, D/C instructions reviewed, and sits independently.

## 2017-07-18 ENCOUNTER — Encounter: Payer: Self-pay | Admitting: *Deleted

## 2017-07-18 ENCOUNTER — Other Ambulatory Visit: Payer: Self-pay

## 2017-07-18 MED ORDER — OXYCODONE HCL 5 MG PO TABS: 5.0000 mg | ORAL_TABLET | Freq: Every day | ORAL | 0 refills | Status: DC

## 2017-07-18 NOTE — Telephone Encounter (Signed)
Pt called stating he only was dispensed #60 of Oxycodone/APAP. I called the pharmacy and states that insurance would only pay for a 30 day supply so that's why they only dispensed #60. Directions for medication would need to be changed to match with the quantity.

## 2017-07-18 NOTE — Telephone Encounter (Signed)
error 

## 2017-07-18 NOTE — Telephone Encounter (Signed)
Oxycodone 5 mg at HS  IR # 30 - e-scribed, according to Dr. Letta Pate instructions.

## 2017-07-18 NOTE — Telephone Encounter (Addendum)
I notified Mrs Ambler as his number was not working.

## 2017-07-18 NOTE — Telephone Encounter (Signed)
Unfortunately he is not on TID dosing , He is BID most days and occ TID, we can write an additional Rx for Oxy IR 5mg  written for 1 po QHS #30 with no Refill.  He can use this additional medication on days that he is more active May have EUnice send this

## 2017-07-19 ENCOUNTER — Telehealth: Payer: Self-pay | Admitting: *Deleted

## 2017-07-19 NOTE — Telephone Encounter (Signed)
Mr Calixte called and says that the wrong medication was sent to the pharmacy.  I spoke with him and explained it is the oxycodone without the tylenol- same medication except that it is 5 mg and not 10.  He understands.

## 2017-07-22 ENCOUNTER — Telehealth: Payer: Self-pay

## 2017-07-22 NOTE — Telephone Encounter (Signed)
Placed a call to Pharmacy for clarification:  Spoke with Hildred Alamin the pharmacist, she states Mr. Matuszak would like the 10 additional tablets noted on original script. Oxycodone ordered from Dr. Letta Pate was 5mg  QHS daily as needed.  Placed a call to Mr. Hitchman regarding the above, no answer, left message return the call.

## 2017-07-22 NOTE — Telephone Encounter (Signed)
[facility] called stating that the prescription for Oxycodone 5mg  was deactivated in error and needs a prescription resent.

## 2017-07-31 ENCOUNTER — Telehealth: Payer: Self-pay | Admitting: Physical Medicine & Rehabilitation

## 2017-07-31 NOTE — Telephone Encounter (Signed)
Pt phoned to stated on his last OV he was told to call a week ahead to see if he needed back injections. Pt stated he wanted injections on his lower back. Please advise what kind of injection and if okay to schedule.

## 2017-08-01 ENCOUNTER — Other Ambulatory Visit: Payer: Self-pay | Admitting: Family Medicine

## 2017-08-01 MED ORDER — ATORVASTATIN CALCIUM 40 MG PO TABS: 40.0000 mg | ORAL_TABLET | Freq: Every day | ORAL | 3 refills | Status: DC

## 2017-08-01 MED ORDER — LOSARTAN POTASSIUM-HCTZ 100-25 MG PO TABS: 1.0000 | ORAL_TABLET | Freq: Every day | ORAL | 3 refills | Status: DC

## 2017-08-01 MED ORDER — GLIPIZIDE 5 MG PO TABS: 5.0000 mg | ORAL_TABLET | Freq: Two times a day (BID) | ORAL | 2 refills | Status: DC

## 2017-08-01 NOTE — Telephone Encounter (Signed)
Okay to schedule right sacroiliac injection

## 2017-08-09 ENCOUNTER — Encounter: Payer: Medicare HMO | Attending: Physical Medicine & Rehabilitation

## 2017-08-09 ENCOUNTER — Ambulatory Visit: Payer: Medicare HMO | Admitting: Physical Medicine & Rehabilitation

## 2017-08-09 VITALS — BP 135/89 | HR 83 | Wt 208.4 lb

## 2017-08-09 DIAGNOSIS — I10 Essential (primary) hypertension: Secondary | ICD-10-CM | POA: Insufficient documentation

## 2017-08-09 DIAGNOSIS — F17210 Nicotine dependence, cigarettes, uncomplicated: Secondary | ICD-10-CM | POA: Insufficient documentation

## 2017-08-09 DIAGNOSIS — M533 Sacrococcygeal disorders, not elsewhere classified: Secondary | ICD-10-CM | POA: Diagnosis not present

## 2017-08-09 DIAGNOSIS — G8929 Other chronic pain: Secondary | ICD-10-CM | POA: Insufficient documentation

## 2017-08-09 DIAGNOSIS — M961 Postlaminectomy syndrome, not elsewhere classified: Secondary | ICD-10-CM | POA: Insufficient documentation

## 2017-08-09 DIAGNOSIS — E119 Type 2 diabetes mellitus without complications: Secondary | ICD-10-CM | POA: Insufficient documentation

## 2017-08-09 DIAGNOSIS — M545 Low back pain, unspecified: Secondary | ICD-10-CM

## 2017-08-09 DIAGNOSIS — Z9889 Other specified postprocedural states: Secondary | ICD-10-CM | POA: Diagnosis not present

## 2017-08-09 DIAGNOSIS — Z981 Arthrodesis status: Secondary | ICD-10-CM | POA: Diagnosis not present

## 2017-08-09 MED ORDER — OXYCODONE-ACETAMINOPHEN 10-325 MG PO TABS: ORAL_TABLET | ORAL | 0 refills | Status: DC

## 2017-08-09 NOTE — Progress Notes (Signed)
  PROCEDURE RECORD [facility]   Name: Jeffrey Austin DOB:1961/02/07 MRN: [id number]  Date:08/09/2017  Physician: Alysia Penna, MD    Nurse/CMA: Betti Cruz  Allergies: No Known Allergies  Consent Signed: Yes.    Is patient diabetic? Yes.    CBG today?   Pregnant: No. LMP: No LMP for male patient. (age 56-55)  Anticoagulants: no Anti-inflammatory: no Antibiotics: no  Procedure: right sacroiliac injection Position: Prone Start Time: 10:00am  End Time: 10:04am  Fluoro Time: 16  RN/CMA Dlynn Ranes,CMA Harris Kistler,CMA    Time 9:42am 10:08am    BP 135/89 147/90    Pulse 86 86    Respirations 14 14    O2 Sat 98 98    S/S 6 6    Pain Level 8/10 3/10     D/C home with no one, [name] & O X 3, D/C instructions reviewed, and sits independently.

## 2017-08-09 NOTE — Progress Notes (Signed)

## 2017-08-09 NOTE — Patient Instructions (Signed)
Sacroiliac injection was performed today. A combination of a numbing medicine plus a cortisone medicine was injected. The injection was done under x-ray guidance with contrast enhancement. This procedure has been performed to help reduce low back and buttocks pain as well as potentially hip pain. The duration of this injection is variable lasting from hours to  Months. It may repeated if needed. 

## 2017-09-02 ENCOUNTER — Encounter: Payer: Medicare HMO | Attending: Physical Medicine & Rehabilitation

## 2017-09-02 ENCOUNTER — Encounter: Payer: Self-pay | Admitting: Physical Medicine & Rehabilitation

## 2017-09-02 ENCOUNTER — Ambulatory Visit: Payer: Medicare HMO | Admitting: Physical Medicine & Rehabilitation

## 2017-09-02 ENCOUNTER — Other Ambulatory Visit: Payer: Self-pay

## 2017-09-02 VITALS — BP 131/88 | HR 96 | Ht 70.0 in | Wt 209.4 lb

## 2017-09-02 DIAGNOSIS — F17210 Nicotine dependence, cigarettes, uncomplicated: Secondary | ICD-10-CM | POA: Diagnosis not present

## 2017-09-02 DIAGNOSIS — M533 Sacrococcygeal disorders, not elsewhere classified: Secondary | ICD-10-CM

## 2017-09-02 DIAGNOSIS — M545 Low back pain: Secondary | ICD-10-CM | POA: Diagnosis not present

## 2017-09-02 DIAGNOSIS — I10 Essential (primary) hypertension: Secondary | ICD-10-CM | POA: Insufficient documentation

## 2017-09-02 DIAGNOSIS — M961 Postlaminectomy syndrome, not elsewhere classified: Secondary | ICD-10-CM | POA: Insufficient documentation

## 2017-09-02 DIAGNOSIS — G8929 Other chronic pain: Secondary | ICD-10-CM | POA: Insufficient documentation

## 2017-09-02 DIAGNOSIS — Z9889 Other specified postprocedural states: Secondary | ICD-10-CM | POA: Insufficient documentation

## 2017-09-02 DIAGNOSIS — E119 Type 2 diabetes mellitus without complications: Secondary | ICD-10-CM | POA: Diagnosis not present

## 2017-09-02 DIAGNOSIS — Z981 Arthrodesis status: Secondary | ICD-10-CM | POA: Insufficient documentation

## 2017-09-02 MED ORDER — OXYCODONE-ACETAMINOPHEN 10-325 MG PO TABS: ORAL_TABLET | ORAL | 0 refills | Status: DC

## 2017-09-02 NOTE — Progress Notes (Signed)
Subjective:    Patient ID: Jeffrey Austin, male    DOB: [birth year], 57 y.o.   MRN: [id number]  HPI 57 year old male with history of anterior approach L4-5 L5-S1 lumbar fusions performed several years ago.  He is subsequently developed increasing pain in the right side of the low back area. He has undergone sacroiliac injections on 2 occasions.  Last injection produced a very good relief of pain greater than 50% for approximately 2 weeks.  The prior one gave him a moderate amount of pain relief. He does not have any pain in his mid back region. He has occasional pain radiating down to his foot primarily on the right side. He has occasional left-sided low back pain as well that goes into his left buttocks.  He has had no bowel or bladder dysfunction. He has had no new symptomatology or lower extremity weakness. Pain Inventory Average Pain 7 Pain Right Now 7 My pain is constant, sharp, burning, stabbing and tingling  In the last 24 hours, has pain interfered with the following? General activity 7 Relation with others 7 Enjoyment of life 7 What TIME of day is your pain at its worst? all Sleep (in general) NA  Pain is worse with: bending, sitting, inactivity and some activites Pain improves with: medication Relief from Meds: 8  Mobility Do you have any goals in this area?  no  Function Do you have any goals in this area?  no  Neuro/Psych No problems in this area  Prior Studies Any changes since last visit?  no  Physicians involved in your care Any changes since last visit?  no   Family History  Problem Relation Age of Onset  . Leukemia Father    Social History   Socioeconomic History  . Marital status: Married    Spouse name: Not on file  . Number of children: Not on file  . Years of education: Not on file  . Highest education level: Not on file  Occupational History  . Not on file  Social Needs  . Financial resource strain: Not on file  . Food insecurity:      Worry: Not on file    Inability: Not on file  . Transportation needs:    Medical: Not on file    Non-medical: Not on file  Tobacco Use  . Smoking status: Current Some Day Smoker  . Smokeless tobacco: Never Used  Substance and Sexual Activity  . Alcohol use: Yes    Alcohol/week: 0.0 oz    Comment: 3-4 times weekly  . Drug use: No  . Sexual activity: Yes  Lifestyle  . Physical activity:    Days per week: Not on file    Minutes per session: Not on file  . Stress: Not on file  Relationships  . Social connections:    Talks on phone: Not on file    Gets together: Not on file    Attends religious service: Not on file    Active member of club or organization: Not on file    Attends meetings of clubs or organizations: Not on file    Relationship status: Not on file  Other Topics Concern  . Not on file  Social History Narrative  . Not on file   Past Surgical History:  Procedure Laterality Date  . cyst removed from testicle     . INCISIONAL HERNIA REPAIR N/A 02/01/2016   Procedure: LAPAROSCOPIC INCISIONAL HERNIA REPAIR;  Surgeon: Arta Bruce Kinsinger, MD;  Location: [facility];  Service: General;  Laterality: N/A;  . INSERTION OF MESH N/A 02/01/2016   Procedure: INSERTION OF MESH;  Surgeon: Arta Bruce Kinsinger, MD;  Location: [facility];  Service: General;  Laterality: N/A;  . LUMBAR FUSION    . SPINAL FUSION     Past Medical History:  Diagnosis Date  . Back pain   . Diabetes mellitus without complication ([place])   . Hypertension    BP 131/88   Pulse 96   Ht 5\' 10"  (1.778 m)   Wt 209 lb 6.4 oz (95 kg)   SpO2 98%   BMI 30.05 kg/m    Opioid Risk Score:   Fall Risk Score:  `1  Depression screen PHQ 2/9  Depression screen PHQ 2/9 09/02/2017  Decreased Interest 0  Down, Depressed, Hopeless 0  PHQ - 2 Score 0    Review of Systems  Constitutional: Negative.   HENT: Negative.   Eyes: Negative.   Respiratory: Negative.   Cardiovascular: Negative.   Gastrointestinal:  Negative.   Endocrine: Negative.   Genitourinary: Negative.   Musculoskeletal: Positive for back pain.  Skin: Negative.   Allergic/Immunologic: Negative.   Neurological: Negative.   Hematological: Negative.   Psychiatric/Behavioral: Negative.   All other systems reviewed and are negative.      Objective:   Physical Exam  Constitutional: He is oriented to person, place, and time. He appears well-developed and well-nourished. No distress.  HENT:  Head: Normocephalic and atraumatic.  Eyes: Pupils are equal, round, and reactive to light. EOM are normal.  Neck: Normal range of motion.  Musculoskeletal:  Patient with reduced range of motion with lumbar flexion extension lateral bending and rotation.  Approximately 50% range in all directions.  Pain in all directions. Negative straight leg raising He has tenderness to palpation over the right PSIS area.  Neurological: He is alert and oriented to person, place, and time. Gait normal.  Motor strength is 5/5 bilateral hip flexor knee extensor ankle dorsiflexor  Skin: Skin is warm and dry. He is not diaphoretic.  Nursing note and vitals reviewed.         Assessment & Plan:  Lumbar postlaminectomy syndrome with chronic postoperative pain he takes oxycodone 10 mg approximately 2 tablets/day although when he tries to be more active he takes an extra tablet.  He therefore receives a prescription for 60 tablets/month PMP aware website reviewed no red flags  Urine drug screen up-to-date  2.  Sacroiliac disorder this occurs in approximately 36% of post lumbar fusion patients.  The patient has had good short-term relief with sacroiliac injection.  We discussed other procedures such as L5 dorsal ramus S1-S2-S3 lateral branch injections under fluoroscopic guidance to see if he is a candidate for radiofrequency neurotomy.  He would like to proceed with this.  He would like to check his insurance benefits for this.

## 2017-09-19 ENCOUNTER — Ambulatory Visit: Payer: Medicare HMO | Admitting: Family Medicine

## 2017-09-27 DIAGNOSIS — H35033 Hypertensive retinopathy, bilateral: Secondary | ICD-10-CM | POA: Diagnosis not present

## 2017-09-27 DIAGNOSIS — E119 Type 2 diabetes mellitus without complications: Secondary | ICD-10-CM | POA: Diagnosis not present

## 2017-09-27 DIAGNOSIS — H524 Presbyopia: Secondary | ICD-10-CM | POA: Diagnosis not present

## 2017-09-27 LAB — HM DIABETES EYE EXAM

## 2017-09-30 ENCOUNTER — Ambulatory Visit: Payer: Medicare HMO

## 2017-09-30 ENCOUNTER — Encounter: Payer: Self-pay | Admitting: Family Medicine

## 2017-09-30 ENCOUNTER — Ambulatory Visit: Payer: Medicare HMO | Admitting: Physical Medicine & Rehabilitation

## 2017-10-07 ENCOUNTER — Telehealth: Payer: Self-pay

## 2017-10-07 ENCOUNTER — Encounter: Payer: Medicare HMO | Attending: Physical Medicine & Rehabilitation

## 2017-10-07 ENCOUNTER — Ambulatory Visit (HOSPITAL_BASED_OUTPATIENT_CLINIC_OR_DEPARTMENT_OTHER): Payer: Medicare HMO | Admitting: Physical Medicine & Rehabilitation

## 2017-10-07 VITALS — BP 143/92 | HR 93 | Ht 70.0 in | Wt 198.0 lb

## 2017-10-07 DIAGNOSIS — E119 Type 2 diabetes mellitus without complications: Secondary | ICD-10-CM | POA: Diagnosis not present

## 2017-10-07 DIAGNOSIS — G8929 Other chronic pain: Secondary | ICD-10-CM | POA: Insufficient documentation

## 2017-10-07 DIAGNOSIS — Z981 Arthrodesis status: Secondary | ICD-10-CM | POA: Insufficient documentation

## 2017-10-07 DIAGNOSIS — Z9889 Other specified postprocedural states: Secondary | ICD-10-CM | POA: Insufficient documentation

## 2017-10-07 DIAGNOSIS — I10 Essential (primary) hypertension: Secondary | ICD-10-CM | POA: Diagnosis not present

## 2017-10-07 DIAGNOSIS — M545 Low back pain, unspecified: Secondary | ICD-10-CM

## 2017-10-07 DIAGNOSIS — M533 Sacrococcygeal disorders, not elsewhere classified: Secondary | ICD-10-CM | POA: Diagnosis not present

## 2017-10-07 DIAGNOSIS — F17210 Nicotine dependence, cigarettes, uncomplicated: Secondary | ICD-10-CM | POA: Diagnosis not present

## 2017-10-07 DIAGNOSIS — M961 Postlaminectomy syndrome, not elsewhere classified: Secondary | ICD-10-CM | POA: Insufficient documentation

## 2017-10-07 MED ORDER — OXYCODONE-ACETAMINOPHEN 10-325 MG PO TABS: ORAL_TABLET | ORAL | 0 refills | Status: DC

## 2017-10-07 NOTE — Progress Notes (Signed)
  PROCEDURE RECORD [name]   Name: Jeffrey Austin DOB:10-19-1960 MRN: [id number]  Date:10/07/2017  Physician: Alysia Penna, MD    Nurse/CMA: Betti Cruz   Allergies: No Known Allergies  Consent Signed: Yes.    Is patient diabetic? Yes.    CBG today? Did not check  Pregnant: No. LMP: No LMP for male patient. (age 56-55)  Anticoagulants: no Anti-inflammatory: no Antibiotics: no  Procedure: Right Lateral Branch Block Position: Prone Start Time:10:26am  End Time:10:35am  Fluoro Time: 42  RN/CMA [name],CMA [name], CMA    Time 10:11am 10:40am    BP 143/92 142/91    Pulse 93 80    Respirations 16 16    O2 Sat 98 97    S/S 6 6    Pain Level 8/10 5/10     D/C home with wife [name] & O X 3, D/C instructions reviewed, and sits independently.

## 2017-10-07 NOTE — Telephone Encounter (Signed)
Pt is requesting a referral to Massage Therapy to loosen up body. Prefer [place] area but will come to [place] if needed.

## 2017-10-07 NOTE — Patient Instructions (Signed)
Lateral branch blocks done today , if 50% or better improvement would need to repeat prior to proceeding with Radiofrequency neurotomy

## 2017-10-07 NOTE — Progress Notes (Addendum)
L5 dorsal ramus S1-S2-S3 lateral branch blocks under fluoroscopic guidance Right side  Informed consent was obtained after describing risks and benefits of the procedure with patient these include bleeding bruising and infection.  He elects to proceed and has given written consent.  Patient placed prone on fluoroscopy table Betadine prep sterile drape a 25-gauge 1.5 inch needle was used to anesthetize skin and subcu tissue with 1% lidocaine 1 cc into each of 4 sites.  Then a 22-gauge 3.5" needle was inserted under fluoroscopic guidance for starting the S1 SAP sacral ala junction.  Bone contact made.  Isovue 200 x 0.5 mL demonstrated no intravascular uptake then 0.5 mL of 2% lidocaine was injected.  Then the lateral aspect of the S1, S2, S3 foramen was targeted.  Bone contact made out, Isovue-200 times 0.5 mL demonstrated no nerve root or intravascular uptake within 0.5 mL of 2% lidocaine solution was injected after negative drawback for blood.  Patient tolerated procedure well.  Postinjection instructions given. 

## 2017-10-07 NOTE — Telephone Encounter (Signed)
Since insurance does not pay for massage, I do not think he needs a referral.  I really do not have anybody that I recommend other than integrative therapy.  I would advise that he calls integrative therapy to see if they require a physician referral for massage therapy

## 2017-10-08 NOTE — Telephone Encounter (Signed)
Contacted patient and left a detailed voicemail per patients DPR advising patient to contact integrative therapies.  I gave him the phone number.  I encouraged patient to call us back with any questions.

## 2017-11-11 ENCOUNTER — Encounter: Payer: Self-pay | Admitting: Physical Medicine & Rehabilitation

## 2017-11-11 ENCOUNTER — Encounter: Payer: Medicare HMO | Attending: Physical Medicine & Rehabilitation

## 2017-11-11 ENCOUNTER — Ambulatory Visit: Payer: Medicare HMO | Admitting: Physical Medicine & Rehabilitation

## 2017-11-11 VITALS — BP 152/102 | HR 78 | Ht 71.0 in | Wt 213.0 lb

## 2017-11-11 DIAGNOSIS — Z981 Arthrodesis status: Secondary | ICD-10-CM | POA: Insufficient documentation

## 2017-11-11 DIAGNOSIS — F17210 Nicotine dependence, cigarettes, uncomplicated: Secondary | ICD-10-CM | POA: Insufficient documentation

## 2017-11-11 DIAGNOSIS — M545 Low back pain: Secondary | ICD-10-CM | POA: Insufficient documentation

## 2017-11-11 DIAGNOSIS — E119 Type 2 diabetes mellitus without complications: Secondary | ICD-10-CM | POA: Diagnosis not present

## 2017-11-11 DIAGNOSIS — G8929 Other chronic pain: Secondary | ICD-10-CM | POA: Insufficient documentation

## 2017-11-11 DIAGNOSIS — Z5181 Encounter for therapeutic drug level monitoring: Secondary | ICD-10-CM

## 2017-11-11 DIAGNOSIS — M533 Sacrococcygeal disorders, not elsewhere classified: Secondary | ICD-10-CM

## 2017-11-11 DIAGNOSIS — Z9889 Other specified postprocedural states: Secondary | ICD-10-CM | POA: Insufficient documentation

## 2017-11-11 DIAGNOSIS — Z79891 Long term (current) use of opiate analgesic: Secondary | ICD-10-CM | POA: Diagnosis not present

## 2017-11-11 DIAGNOSIS — I10 Essential (primary) hypertension: Secondary | ICD-10-CM | POA: Insufficient documentation

## 2017-11-11 DIAGNOSIS — M961 Postlaminectomy syndrome, not elsewhere classified: Secondary | ICD-10-CM | POA: Insufficient documentation

## 2017-11-11 DIAGNOSIS — G894 Chronic pain syndrome: Secondary | ICD-10-CM

## 2017-11-11 MED ORDER — OXYCODONE-ACETAMINOPHEN 10-325 MG PO TABS: ORAL_TABLET | ORAL | 0 refills | Status: DC

## 2017-11-11 NOTE — Progress Notes (Signed)
L5 dorsal ramus S1-S2-S3 lateral branch blocks under fluoroscopic guidance Right side  Informed consent was obtained after describing risks and benefits of the procedure with patient these include bleeding bruising and infection.  He elects to proceed and has given written consent.  Patient placed prone on fluoroscopy table Betadine prep sterile drape a 25-gauge 1.5 inch needle was used to anesthetize skin and subcu tissue with 1% lidocaine 1 cc into each of 4 sites.  Then a 22-gauge 3.5" needle was inserted under fluoroscopic guidance for starting the S1 SAP sacral ala junction.  Bone contact made.  Isovue 200 x 0.5 mL demonstrated no intravascular uptake then 0.5 mL of 2% lidocaine was injected.  Then the lateral aspect of the S1, S2, S3 foramen was targeted.  Bone contact made out, Isovue-200 times 0.5 mL demonstrated no nerve root or intravascular uptake within 0.5 mL of 2% lidocaine solution was injected after negative drawback for blood.  Patient tolerated procedure well.  Postinjection instructions given. 

## 2017-11-11 NOTE — Patient Instructions (Addendum)
Have sent message to Dr Ernestina Patches at [facility] to see if he does the sacroiliac radiofrequency procedure See [name] for meds next month

## 2017-11-11 NOTE — Progress Notes (Deleted)
Subjective:    Patient ID: Jeffrey Austin, male    DOB: 08-Sep-1960, 57 y.o.   MRN: [id number]  HPI   Pain Inventory Average Pain {NUMBERS; 0-10:5044} Pain Right Now {NUMBERS; 0-10:5044} My pain is {PAIN DESCRIPTION:21022940}  In the last 24 hours, has pain interfered with the following? General activity {NUMBERS; 0-10:5044} Relation with others {NUMBERS; 0-10:5044} Enjoyment of life {NUMBERS; 0-10:5044} What TIME of day is your pain at its worst? {TIME OF YFV:49449675} Sleep (in general) {BHH GOOD/FAIR/POOR:22877}  Pain is worse with: {ACTIVITIES:21022942} Pain improves with: {PAIN IMPROVES FFMB:84665993} Relief from Meds: {NUMBERS; 0-10:5044}  Mobility {MOBILITY TTS:17793903}  Function {FUNCTION:21022946}  Neuro/Psych {NEURO/PSYCH:21022948}  Prior Studies {CPRM PRIOR STUDIES:21022953}  Physicians involved in your care {CPRM PHYSICIANS INVOLVED IN YOUR CARE:21022954}   Family History  Problem Relation Age of Onset  . Leukemia Father    Social History   Socioeconomic History  . Marital status: Married    Spouse name: Not on file  . Number of children: Not on file  . Years of education: Not on file  . Highest education level: Not on file  Occupational History  . Not on file  Social Needs  . Financial resource strain: Not on file  . Food insecurity:    Worry: Not on file    Inability: Not on file  . Transportation needs:    Medical: Not on file    Non-medical: Not on file  Tobacco Use  . Smoking status: Current Some Day Smoker  . Smokeless tobacco: Never Used  Substance and Sexual Activity  . Alcohol use: Yes    Alcohol/week: 0.0 standard drinks    Comment: 3-4 times weekly  . Drug use: No  . Sexual activity: Yes  Lifestyle  . Physical activity:    Days per week: Not on file    Minutes per session: Not on file  . Stress: Not on file  Relationships  . Social connections:    Talks on phone: Not on file    Gets together: Not on file   Attends religious service: Not on file    Active member of club or organization: Not on file    Attends meetings of clubs or organizations: Not on file    Relationship status: Not on file  Other Topics Concern  . Not on file  Social History Narrative  . Not on file   Past Surgical History:  Procedure Laterality Date  . cyst removed from testicle     . INCISIONAL HERNIA REPAIR N/A 02/01/2016   Procedure: LAPAROSCOPIC INCISIONAL HERNIA REPAIR;  Surgeon: Arta Bruce Kinsinger, MD;  Location: [facility];  Service: General;  Laterality: N/A;  . INSERTION OF MESH N/A 02/01/2016   Procedure: INSERTION OF MESH;  Surgeon: Arta Bruce Kinsinger, MD;  Location: [facility];  Service: General;  Laterality: N/A;  . LUMBAR FUSION    . SPINAL FUSION     Past Medical History:  Diagnosis Date  . Back pain   . Diabetes mellitus without complication ([place])   . Hypertension    Ht 5\' 11"  (1.803 m)   Wt 213 lb (96.6 kg)   BMI 29.71 kg/m   Opioid Risk Score:   Fall Risk Score:  `1  Depression screen PHQ 2/9  Depression screen PHQ 2/9 09/02/2017  Decreased Interest 0  Down, Depressed, Hopeless 0  PHQ - 2 Score 0   Review of Systems     Objective:   Physical Exam        Assessment &  Plan:

## 2017-11-11 NOTE — Progress Notes (Signed)
  PROCEDURE RECORD [facility]   Name: Jeffrey Austin DOB:[birth year] MRN: [id number]  Date:11/11/2017  Physician: Alysia Penna, MD    Nurse/CMA: Marygrace Sandoval, CMA  Allergies: No Known Allergies  Consent Signed: Yes.    Is patient diabetic? No.  CBG today?  Pregnant: No. LMP: No LMP for male patient. (age 57-55)  Anticoagulants: no Anti-inflammatory: no Antibiotics: no  Procedure: right L5 dorsal ramus, S1-3 lateral branch block  Position: Prone Start Time: 10:31 am  End Time: 10:42am  Fluoro Time: 51s  RN/CMA Amariona Rathje, CMA Marisue Canion, CMA    Time 10:15am 10:50am    BP 152/102 145/98    Pulse 78 77    Respirations 14 14    O2 Sat 98 98    S/S 6 6    Pain Level 9/10 5/10     D/C home with self, [name] & O X 3, D/C instructions reviewed, and sits independently.

## 2017-11-12 ENCOUNTER — Telehealth: Payer: Self-pay | Admitting: *Deleted

## 2017-11-12 DIAGNOSIS — G8929 Other chronic pain: Secondary | ICD-10-CM

## 2017-11-12 DIAGNOSIS — M533 Sacrococcygeal disorders, not elsewhere classified: Principal | ICD-10-CM

## 2017-11-12 NOTE — Telephone Encounter (Signed)
Referral placed.

## 2017-11-12 NOTE — Telephone Encounter (Addendum)
-----   Message from Charlett Blake, MD sent at 11/11/2017  5:50 PM EDT ----- Please refer to Dr Laurence Spates for Right sacroiliac RFA

## 2017-11-14 LAB — DRUG TOX MONITOR 1 W/CONF, ORAL FLD
Amphetamines: NEGATIVE ng/mL (ref <10)
BENZODIAZEPINES: NEGATIVE ng/mL (ref <0.50)
Barbiturates: NEGATIVE ng/mL (ref <10)
Buprenorphine: NEGATIVE ng/mL (ref <0.10)
CODEINE: NEGATIVE ng/mL (ref <2.5)
Cocaine: NEGATIVE ng/mL (ref <5.0)
Cotinine: 184.9 ng/mL — ABNORMAL HIGH (ref <5.0)
Dihydrocodeine: NEGATIVE ng/mL (ref <2.5)
Fentanyl: NEGATIVE ng/mL (ref <0.10)
HYDROCODONE: NEGATIVE ng/mL (ref <2.5)
HYDROMORPHONE: NEGATIVE ng/mL (ref <2.5)
Heroin Metabolite: NEGATIVE ng/mL (ref <1.0)
MARIJUANA: NEGATIVE ng/mL (ref <2.5)
MDMA: NEGATIVE ng/mL (ref <10)
MEPROBAMATE: NEGATIVE ng/mL (ref <2.5)
MORPHINE: NEGATIVE ng/mL (ref <2.5)
Methadone: NEGATIVE ng/mL (ref <5.0)
NORHYDROCODONE: NEGATIVE ng/mL (ref <2.5)
Nicotine Metabolite: POSITIVE ng/mL — AB (ref <5.0)
Noroxycodone: 40.6 ng/mL — ABNORMAL HIGH (ref <2.5)
OPIATES: POSITIVE ng/mL — AB (ref <2.5)
Oxycodone: >250 ng/mL — ABNORMAL HIGH (ref <2.5)
Oxymorphone: NEGATIVE ng/mL (ref <2.5)
Phencyclidine: NEGATIVE ng/mL (ref <10)
TAPENTADOL: NEGATIVE ng/mL (ref <5.0)
Tramadol: NEGATIVE ng/mL (ref <5.0)
ZOLPIDEM: NEGATIVE ng/mL (ref <5.0)

## 2017-11-14 LAB — DRUG TOX ALC METAB W/CON, ORAL FLD: Alcohol Metabolite: NEGATIVE ng/mL (ref <25)

## 2017-11-21 ENCOUNTER — Telehealth (INDEPENDENT_AMBULATORY_CARE_PROVIDER_SITE_OTHER): Payer: Self-pay | Admitting: Radiology

## 2017-11-21 NOTE — Telephone Encounter (Signed)
Patient left voicemail in regards to appointment with Dr. Ernestina Patches.  He is currently a patient with Dr. Ella Bodo however due to billing issues with [facility] he is being referred to Dr. Ernestina Patches. Referral is in Epic.  Call back # [phone]

## 2017-11-25 NOTE — Telephone Encounter (Signed)
Pt appt is 12/24/17 with driver.

## 2017-12-09 ENCOUNTER — Encounter: Payer: Medicare HMO | Attending: Physical Medicine & Rehabilitation | Admitting: Registered Nurse

## 2017-12-09 ENCOUNTER — Other Ambulatory Visit: Payer: Self-pay

## 2017-12-09 ENCOUNTER — Encounter: Payer: Self-pay | Admitting: Registered Nurse

## 2017-12-09 VITALS — BP 151/96 | HR 81 | Ht 70.0 in | Wt 211.8 lb

## 2017-12-09 DIAGNOSIS — M5416 Radiculopathy, lumbar region: Secondary | ICD-10-CM

## 2017-12-09 DIAGNOSIS — F17210 Nicotine dependence, cigarettes, uncomplicated: Secondary | ICD-10-CM | POA: Diagnosis not present

## 2017-12-09 DIAGNOSIS — Z79891 Long term (current) use of opiate analgesic: Secondary | ICD-10-CM | POA: Diagnosis not present

## 2017-12-09 DIAGNOSIS — M961 Postlaminectomy syndrome, not elsewhere classified: Secondary | ICD-10-CM | POA: Diagnosis not present

## 2017-12-09 DIAGNOSIS — G894 Chronic pain syndrome: Secondary | ICD-10-CM | POA: Diagnosis not present

## 2017-12-09 DIAGNOSIS — Z9889 Other specified postprocedural states: Secondary | ICD-10-CM | POA: Insufficient documentation

## 2017-12-09 DIAGNOSIS — I10 Essential (primary) hypertension: Secondary | ICD-10-CM | POA: Diagnosis not present

## 2017-12-09 DIAGNOSIS — M545 Low back pain: Secondary | ICD-10-CM

## 2017-12-09 DIAGNOSIS — M533 Sacrococcygeal disorders, not elsewhere classified: Secondary | ICD-10-CM

## 2017-12-09 DIAGNOSIS — E119 Type 2 diabetes mellitus without complications: Secondary | ICD-10-CM | POA: Insufficient documentation

## 2017-12-09 DIAGNOSIS — G8929 Other chronic pain: Secondary | ICD-10-CM | POA: Diagnosis not present

## 2017-12-09 DIAGNOSIS — Z5181 Encounter for therapeutic drug level monitoring: Secondary | ICD-10-CM | POA: Diagnosis not present

## 2017-12-09 DIAGNOSIS — Z981 Arthrodesis status: Secondary | ICD-10-CM | POA: Insufficient documentation

## 2017-12-09 MED ORDER — OXYCODONE-ACETAMINOPHEN 10-325 MG PO TABS: ORAL_TABLET | ORAL | 0 refills | Status: DC

## 2017-12-09 NOTE — Progress Notes (Signed)
Subjective:    Patient ID: Jeffrey Austin, male    DOB: 1960-03-22, 57 y.o.   MRN: [id number]  HPI: [name] is a 57 year old male who is here for follow up appointment for chronic pain. He states his pain is located in his lower back radiating into his bilateral lower extremities R>L. Also reports increase intensity of lower back pain over the last few weeks with no relief of his pain since his last injection on 11/11/2017 Right L5, S1-S3 Lateral Branch Block. He also reports his Oxycodone only gives him 3 hours of relief of his pain. He is scheduled to see Dr. Ernestina Austin on 12/24/2017 for Right Sacroiliac Injection.  [name] forgot his medication and Narcotic Policy reviewed and he verbalizes understanding. According to PMP Aware Web-Site he picked up Oxycodone on 11/11/2017.   [name] is 30.00 MME. On 06/10/2017 UDS was consistent.   Pain Inventory Average Pain 8 Pain Right Now 8 My pain is sharp, burning, stabbing, tingling and aching  In the last 24 hours, has pain interfered with the following? General activity 8 Relation with others 8 Enjoyment of life 8 What TIME of day is your pain at its worst? all Sleep (in general) Fair  Pain is worse with: walking, bending, sitting and standing Pain improves with: rest and medication Relief from Meds: 2  Mobility Do you have any goals in this area?  no  Function Do you have any goals in this area?  no  Neuro/Psych No problems in this area  Prior Studies Any changes since last visit?  no  Physicians involved in your care Any changes since last visit?  no   Family History  Problem Relation Age of Onset  . Leukemia Father    Social History   Socioeconomic History  . Marital status: Married    Spouse name: Not on file  . Number of children: Not on file  . Years of education: Not on file  . Highest education level: Not on file  Occupational History  . Not on file  Social Needs    . Financial resource strain: Not on file  . Food insecurity:    Worry: Not on file    Inability: Not on file  . Transportation needs:    Medical: Not on file    Non-medical: Not on file  Tobacco Use  . Smoking status: Current Some Day Smoker  . Smokeless tobacco: Never Used  Substance and Sexual Activity  . Alcohol use: Yes    Alcohol/week: 0.0 standard drinks    Comment: 3-4 times weekly  . Drug use: No  . Sexual activity: Yes  Lifestyle  . Physical activity:    Days per week: Not on file    Minutes per session: Not on file  . Stress: Not on file  Relationships  . Social connections:    Talks on phone: Not on file    Gets together: Not on file    Attends religious service: Not on file    Active member of club or organization: Not on file    Attends meetings of clubs or organizations: Not on file    Relationship status: Not on file  Other Topics Concern  . Not on file  Social History Narrative  . Not on file   Past Surgical History:  Procedure Laterality Date  . cyst removed from testicle     . INCISIONAL HERNIA REPAIR N/A 02/01/2016   Procedure: LAPAROSCOPIC INCISIONAL HERNIA  REPAIR;  Surgeon: Mickeal Skinner, MD;  Location: [facility];  Service: General;  Laterality: N/A;  . INSERTION OF MESH N/A 02/01/2016   Procedure: INSERTION OF MESH;  Surgeon: Arta Bruce Kinsinger, MD;  Location: [facility];  Service: General;  Laterality: N/A;  . LUMBAR FUSION    . SPINAL FUSION     Past Medical History:  Diagnosis Date  . Back pain   . Diabetes mellitus without complication ([place])   . Hypertension    BP (!) 151/96   Pulse 81   Ht 5\' 10"  (1.778 m)   Wt 211 lb 12.8 oz (96.1 kg)   SpO2 98%   BMI 30.39 kg/m   Opioid Risk Score:   Fall Risk Score:  `1  Depression screen PHQ 2/9  Depression screen [facility] 2/9 12/09/2017 09/02/2017  Decreased Interest 0 0  Down, Depressed, Hopeless 0 0  PHQ - 2 Score 0 0    Review of Systems  Constitutional: Negative.   HENT: Negative.    Eyes: Negative.   Respiratory: Negative.   Cardiovascular: Negative.   Gastrointestinal: Negative.   Endocrine: Negative.   Genitourinary: Negative.   Musculoskeletal: Negative.   Skin: Negative.   Allergic/Immunologic: Negative.   Neurological: Negative.   Hematological: Negative.   Psychiatric/Behavioral: Negative.   All other systems reviewed and are negative.      Objective:   Physical Exam  Constitutional: He is oriented to person, place, and time. He appears well-developed and well-nourished.  HENT:  Head: Normocephalic and atraumatic.  Neck: Normal range of motion. Neck supple.  Cardiovascular: Normal rate and regular rhythm.  Pulmonary/Chest: Effort normal and breath sounds normal.  Musculoskeletal:  Normal Muscle Bulk and Muscle Testing Reveals:  Upper Extremities: Full ROM and Muscle Strength 5/5 Lumbar Hypersensitivity Lower Extremities: Full ROM an dMuscle Strength 5/5 Right Lower Extremity Flexion Produces Pain into his Lumbar and Right Lower Extremity Left Lower Extremity Flexion Produces Pain into Lumbar Mainly Right Side Arises from chair slowly Antalgic gait  Neurological: He is alert and oriented to person, place, and time.  Skin: Skin is warm and dry.  Psychiatric: He has a normal mood and affect. His behavior is normal.  Nursing note and vitals reviewed.         Assessment & Plan:  1. Acute Exacerbation of Chronic Low Back Pain: He is scheduled to see Dr. Ernestina Austin on 12/24/2017 for Right SI. 2. Post Laminectomy Syndrome: Continue current medication regimen and HEP as tolerated.  3. Chronic Right Sacroiliac Injection: Scheduled for SI injection with Dr. Ernestina Austin.  4. Chronic Lumbar Radiculopathy: Continue to Monitor.  5. Chronic pain Syndrome: Refilled: Oxycodone 10/325 mg one tablet every 12 hours may take an extra dose with increased activity. #70.   20 minutes of face to face patient care time was spent during this visit. All questions were  encouraged and answered.  F/U in 1 month

## 2017-12-20 ENCOUNTER — Encounter: Payer: Self-pay | Admitting: Family Medicine

## 2017-12-20 ENCOUNTER — Ambulatory Visit (INDEPENDENT_AMBULATORY_CARE_PROVIDER_SITE_OTHER): Payer: Medicare HMO | Admitting: Family Medicine

## 2017-12-20 VITALS — BP 138/88 | HR 101 | Temp 98.8°F | Ht 70.0 in | Wt 212.4 lb

## 2017-12-20 DIAGNOSIS — M961 Postlaminectomy syndrome, not elsewhere classified: Secondary | ICD-10-CM | POA: Diagnosis not present

## 2017-12-20 DIAGNOSIS — E119 Type 2 diabetes mellitus without complications: Secondary | ICD-10-CM | POA: Diagnosis not present

## 2017-12-20 DIAGNOSIS — Z720 Tobacco use: Secondary | ICD-10-CM

## 2017-12-20 DIAGNOSIS — I10 Essential (primary) hypertension: Secondary | ICD-10-CM | POA: Diagnosis not present

## 2017-12-20 LAB — CBC
HEMATOCRIT: 48 % (ref 39.0–52.0)
Hemoglobin: 16.6 g/dL (ref 13.0–17.0)
MCHC: 34.7 g/dL (ref 30.0–36.0)
MCV: 98.1 fl (ref 78.0–100.0)
Platelets: 128 10*3/uL — ABNORMAL LOW (ref 150.0–400.0)
RBC: 4.89 Mil/uL (ref 4.22–5.81)
RDW: 13.3 % (ref 11.5–15.5)
WBC: 7.4 10*3/uL (ref 4.0–10.5)

## 2017-12-20 LAB — COMPREHENSIVE METABOLIC PANEL
ALK PHOS: 65 U/L (ref 39–117)
ALT: 50 U/L (ref 0–53)
AST: 43 U/L — ABNORMAL HIGH (ref 0–37)
Albumin: 4.2 g/dL (ref 3.5–5.2)
BUN: 11 mg/dL (ref 6–23)
CALCIUM: 9.7 mg/dL (ref 8.4–10.5)
CO2: 31 meq/L (ref 19–32)
Chloride: 100 mEq/L (ref 96–112)
Creatinine, Ser: 0.94 mg/dL (ref 0.40–1.50)
GFR: 88.15 mL/min (ref >60.00)
Glucose, Bld: 141 mg/dL — ABNORMAL HIGH (ref 70–99)
Potassium: 3.8 mEq/L (ref 3.5–5.1)
Sodium: 140 mEq/L (ref 135–145)
Total Bilirubin: 0.8 mg/dL (ref 0.2–1.2)
Total Protein: 7.2 g/dL (ref 6.0–8.3)

## 2017-12-20 LAB — LIPID PANEL
CHOL/HDL RATIO: 2
CHOLESTEROL: 103 mg/dL (ref 0–200)
HDL: 55 mg/dL (ref >39.00)
LDL Cholesterol: 25 mg/dL (ref 0–99)
NonHDL: 47.61
Triglycerides: 113 mg/dL (ref 0.0–149.0)
VLDL: 22.6 mg/dL (ref 0.0–40.0)

## 2017-12-20 LAB — HEMOGLOBIN A1C: HEMOGLOBIN A1C: 6.6 % — AB (ref 4.6–6.5)

## 2017-12-20 MED ORDER — KETOROLAC TROMETHAMINE 60 MG/2ML IM SOLN
60.0000 mg | Freq: Once | INTRAMUSCULAR | Status: AC
  Administered 2017-12-20: 60 mg via INTRAMUSCULAR

## 2017-12-20 MED ORDER — VARENICLINE TARTRATE 0.5 MG X 11 & 1 MG X 42 PO MISC: ORAL | 0 refills | Status: DC

## 2017-12-20 MED ORDER — GLIPIZIDE 5 MG PO TABS: 5.0000 mg | ORAL_TABLET | Freq: Two times a day (BID) | ORAL | 2 refills | Status: DC

## 2017-12-20 NOTE — Progress Notes (Signed)
Pre visit review using our clinic review tool, if applicable. No additional management support is needed unless otherwise documented below in the visit note. 

## 2017-12-20 NOTE — Patient Instructions (Addendum)
Keep the diet clean and stay active.  Give [place] 2-3 business days to get the results of your labs back.   You don't need to check your sugars if you are well controlled. We will let you know if you need to check them.   Well done with your efforts to stop smoking!  Let us know if you need anything.

## 2017-12-20 NOTE — Progress Notes (Signed)
Subjective:   Chief Complaint  Patient presents with  . Follow-up    pain    Jeffrey Austin is a 57 y.o. male here for follow-up of diabetes.   Patient denies hypoglycemic reactions. He checks his glucose levels 0 times per day. Patient does not require insulin.   Medications include: Metformin 1000 mg bid, glipizide 5 mg bid Exercise: walks daily  Hypertension Patient presents for hypertension follow up. He does monitor home blood pressures. He is compliant with medications. Patient has these side effects of medication: none He is usually adhering to a healthy diet overall. Exercise: walking  Smoking, currently requesting Chantix. Had quit in past but then wife started smoking again.   Patient follows with pain management for chronic low back pain.  He recently had an injection where he was told the medicine "may have gone the wrong way".  He has been in excruciating pain since then.  He sees another pain management specialist this coming Tuesday due to network issues for insurance.  Past Medical History:  Diagnosis Date  . Back pain   . Diabetes mellitus without complication ([place])   . Hypertension      Related testing: Date of retinal exam: 09/27/17 Pneumovax: Refuses Flu Shot: Refuses  Review of Systems: Pulmonary:  No SOB Cardiovascular:  No chest pain  Objective:  BP 138/88 (BP Location: Left Arm, Patient Position: Sitting, Cuff Size: Normal)   Pulse (!) 101   Temp 98.8 F (37.1 C) (Oral)   Ht 5\' 10"  (1.778 m)   Wt 212 lb 6 oz (96.3 kg)   SpO2 98%   BMI 30.47 kg/m  General:  Well developed, well nourished, in apparent distress due to pain Skin:  Warm, no pallor or diaphoresis Head:  Normocephalic, atraumatic Eyes:  Pupils equal and round, sclera anicteric without injection  Lungs:  CTAB, no access msc use Cardio:  RRR, no bruits, no LE edema Psych: Age appropriate judgment and insight  Assessment:   Type 2 diabetes mellitus without complication,  without long-term current use of insulin (HCC) - Plan: glipiZIDE (GLUCOTROL) 5 MG tablet, Hemoglobin A1c, Lipid panel, Comprehensive metabolic panel, CBC  Tobacco abuse - Plan: varenicline (CHANTIX PAK) 0.5 MG X 11 & 1 MG X 42 tablet  Essential hypertension  Postlaminectomy syndrome, lumbar region - Plan: ketorolac (TORADOL) injection 60 mg   Plan:   Orders as above. Last A1c 6.7.  Continue current management pending results. Refill Chantix starting pack.  Commended on cessation efforts. Continue current medication for blood pressure. We will give injection of Toradol today to help with immediate pain.  Follow-up with pain management will be mainstay of treatment. F/u in 6 mo for CPE or prn. The patient voiced understanding and agreement to the plan.  [place], DO 12/20/17 1:01 PM

## 2017-12-21 ENCOUNTER — Other Ambulatory Visit: Payer: Self-pay | Admitting: Family Medicine

## 2017-12-21 DIAGNOSIS — R740 Nonspecific elevation of levels of transaminase and lactic acid dehydrogenase [LDH]: Principal | ICD-10-CM

## 2017-12-21 DIAGNOSIS — R7401 Elevation of levels of liver transaminase levels: Secondary | ICD-10-CM

## 2017-12-24 ENCOUNTER — Ambulatory Visit (INDEPENDENT_AMBULATORY_CARE_PROVIDER_SITE_OTHER): Payer: Self-pay

## 2017-12-24 ENCOUNTER — Encounter (INDEPENDENT_AMBULATORY_CARE_PROVIDER_SITE_OTHER): Payer: Self-pay | Admitting: Physical Medicine and Rehabilitation

## 2017-12-24 ENCOUNTER — Ambulatory Visit (INDEPENDENT_AMBULATORY_CARE_PROVIDER_SITE_OTHER): Payer: Medicare HMO | Admitting: Physical Medicine and Rehabilitation

## 2017-12-24 VITALS — BP 159/105 | HR 92 | Temp 98.4°F

## 2017-12-24 DIAGNOSIS — G8929 Other chronic pain: Secondary | ICD-10-CM | POA: Diagnosis not present

## 2017-12-24 DIAGNOSIS — M792 Neuralgia and neuritis, unspecified: Secondary | ICD-10-CM

## 2017-12-24 DIAGNOSIS — M545 Low back pain: Secondary | ICD-10-CM | POA: Diagnosis not present

## 2017-12-24 DIAGNOSIS — M961 Postlaminectomy syndrome, not elsewhere classified: Secondary | ICD-10-CM

## 2017-12-24 DIAGNOSIS — M533 Sacrococcygeal disorders, not elsewhere classified: Secondary | ICD-10-CM

## 2017-12-24 MED ORDER — METHYLPREDNISOLONE ACETATE 80 MG/ML IJ SUSP: 40.0000 mg | Freq: Once | INTRAMUSCULAR | Status: DC

## 2017-12-24 NOTE — Progress Notes (Signed)
Jeffrey Austin - 57 y.o. male MRN [id number]  Date of birth: [birth year]  Office Visit Note: Visit Date: 12/24/2017 PCP: Shelda Pal, DO Referred by: Shelda Pal*  Subjective: Chief Complaint  Patient presents with  . Lower Back - Pain  . Right Leg - Pain   HPI:  Jeffrey Austin is a 57 y.o. male who comes in today For planned radiofrequency ablation of the lateral branches of L5 and S1 and S2 on the right for chronic worsening pain of the low back and right sacrum.  He is status post lumbar fusion.  He is followed chronically by Dr. Alysia Penna who has completed diagnostic lateral branch blocks.  He is failed conservative care and nonconservative care including lumbar fusion pain medication including opioids etc.  He has some referral pain in the right buttock and posterior lateral upper leg.  He is unable to do a lot of household work and walking.  Medication helps but its temporary.  He rates his pain as an 8 out of 10.  He had double diagnostic lateral branch blocks and these are documented in the chart by Dr. Letta Pate.  ROS Otherwise per HPI.  Assessment & Plan: Visit Diagnoses:  1. Neuritis   2. Sacrococcygeal disorders, not elsewhere classified   3. Chronic right-sided low back pain without sciatica   4. Post laminectomy syndrome     Plan: No additional findings.   Meds & Orders:  Meds ordered this encounter  Medications  . methylPREDNISolone acetate (DEPO-MEDROL) injection 40 mg    Orders Placed This Encounter  Procedures  . Radiofrequency,Lumbar  . XR C-ARM NO REPORT    Follow-up: Return for Alysia Penna, MD.   Procedures: No procedures performed  Sacroiliac Joint Peripheral Nerve Denervation - Posterior Approach with Fluoroscopic Guidance   Patient: Jeffrey Austin      Date of Birth: September 23, 1960 MRN: [id number] PCP: Shelda Pal, DO      Visit Date: 12/24/2017   Universal Protocol:    Date/Time: 10/22/195:08  PM  Consent Given By: the patient  Position: PRONE  Additional Comments: Vital signs were monitored before and after the procedure. Patient was prepped and draped in the usual sterile fashion. The correct patient, procedure, and site was verified.   Injection Procedure Details:  Procedure Site One Meds Administered:  Meds ordered this encounter  Medications  . methylPREDNISolone acetate (DEPO-MEDROL) injection 40 mg     Laterality: Right  Location/Site:  L5-S1 S1-2 S2-3  Needle size: 18 G  Needle type: Stryker radiofrequency cannula  Findings:    -Comments: There is excellent placement of the cannulas and biplanar imaging and there was good stimulation of the lateral branches.  Procedure Details: For the L5, S1 and S2 dorsal rami the fluoroscope was positioned to square off the sacrum or sacral ala to achieve a true AP midline view.  For the L5 dorsal rami the beam was then obliqued 20 degrees and caudally tilted 15 to 20 degrees to line up a trajectory along the target nerve. The skin over the target of the junction of superior articulating process and sacral ala was infiltrated with local anesthetic.  The 18 gauge 6mm active tip outer cannula was advanced in trajectory view to the target. The outer cannula placement was fine-tuned and the position was then confirmed with bi-planar imaging.  For the S1 and S2 dorsal rami, no obliquity was utilized but the same caudal tilt was used.  Three targets corresponding to the 12 o'clock and  6 o'clock and the lateral mid-line positions around the foramen were  visualized. The 18 gauge 39mm active tip outer cannula was advanced in trajectory view to each target. The outer cannula placement was fine-tuned and the position was then confirmed with bi-planar imaging  This procedure was repeated for each target nerve position.  Then, for all levels, the outer cannula placement was fine-tuned and the position was then confirmed with  bi-planar imaging.    Test stimulation was done both at sensory and motor levels to ensure there was no radicular stimulation. The target tissues were then infiltrated with 1 ml of 1% Lidocaine without Epinephrine. Subsequently, a percutaneous neurotomy was carried out for 90 seconds at 80 degrees Celsius.   One 60 second neurotomy was performed for each target around the S1 and S2 foramen. After the completion of the respective lesions, 1 ml of injectate was delivered.  Appropriate radiographs were obtained to verify the probe placement during the neurotomy.  Additional Comments:  The patient tolerated the procedure well No complications occurred.  Interestingly asked to prescription during ramp up to temperature seem to cause him some discomfort which was not really a burning sensation but just more discomfort despite copious numbing medication.  We did reposition just a few millimeters differently and he did well after that. Dressing: Band-Aid    Post-procedure details: Patient was observed during the procedure. Post-procedure instructions were reviewed.  Patient left the clinic in stable condition.     Clinical History: IMPRESSION: LUMBAR MYELOGRAM IMPRESSION:  Solid L4 through S1 anterior lumbar interbody fusion. No adjacent segment disease is evident. Anatomic alignment.  CT LUMBAR MYELOGRAM IMPRESSION:  Solid appearing L4-5 and L5-S1 anterior lumbar interbody fusion without postoperative complication or residual impingement.  Annular bulge L3-4 with mild facet arthropathy, but no findings concerning for adjacent segment disease.   Electronically Signed   By: Staci Righter M.D.   On: 01/18/2016 10:30     Objective:  VS:  HT:    WT:   BMI:     BP:(!) 159/105  HR:92bpm  TEMP:98.4 F (36.9 C)(Oral)  RESP:  Physical Exam  Musculoskeletal:  Patient ambulates without aid with an antalgic gait to the right with good distal strength.    Ortho Exam Imaging: Xr  C-arm No Report  Result Date: 12/24/2017 Please see Notes tab for imaging impression.

## 2017-12-24 NOTE — Progress Notes (Signed)
.  Numeric Pain Rating Scale and Functional Assessment Average Pain 8   In the last MONTH (on 0-10 scale) has pain interfered with the following?  1. General activity like being  able to carry out your everyday physical activities such as walking, climbing stairs, carrying groceries, or moving a chair?  Rating(9)   +Driver    

## 2017-12-24 NOTE — Procedures (Signed)
Sacroiliac Joint Peripheral Nerve Denervation - Posterior Approach with Fluoroscopic Guidance   Patient: Jeffrey Austin      Date of Birth: 12-25-1960 MRN: [id number] PCP: Shelda Pal, DO      Visit Date: 12/24/2017   Universal Protocol:    Date/Time: 10/22/195:08 PM  Consent Given By: the patient  Position: PRONE  Additional Comments: Vital signs were monitored before and after the procedure. Patient was prepped and draped in the usual sterile fashion. The correct patient, procedure, and site was verified.   Injection Procedure Details:  Procedure Site One Meds Administered:  Meds ordered this encounter  Medications  . methylPREDNISolone acetate (DEPO-MEDROL) injection 40 mg     Laterality: Right  Location/Site:  L5-S1 S1-2 S2-3  Needle size: 18 G  Needle type: Stryker radiofrequency cannula  Findings:    -Comments: There is excellent placement of the cannulas and biplanar imaging and there was good stimulation of the lateral branches.  Procedure Details: For the L5, S1 and S2 dorsal rami the fluoroscope was positioned to square off the sacrum or sacral ala to achieve a true AP midline view.  For the L5 dorsal rami the beam was then obliqued 20 degrees and caudally tilted 15 to 20 degrees to line up a trajectory along the target nerve. The skin over the target of the junction of superior articulating process and sacral ala was infiltrated with local anesthetic.  The 18 gauge 36mm active tip outer cannula was advanced in trajectory view to the target. The outer cannula placement was fine-tuned and the position was then confirmed with bi-planar imaging.  For the S1 and S2 dorsal rami, no obliquity was utilized but the same caudal tilt was used.  Three targets corresponding to the 12 o'clock and 6 o'clock and the lateral mid-line positions around the foramen were  visualized. The 18 gauge 10mm active tip outer cannula was advanced in trajectory view to each  target. The outer cannula placement was fine-tuned and the position was then confirmed with bi-planar imaging  This procedure was repeated for each target nerve position.  Then, for all levels, the outer cannula placement was fine-tuned and the position was then confirmed with bi-planar imaging.    Test stimulation was done both at sensory and motor levels to ensure there was no radicular stimulation. The target tissues were then infiltrated with 1 ml of 1% Lidocaine without Epinephrine. Subsequently, a percutaneous neurotomy was carried out for 90 seconds at 80 degrees Celsius.   One 60 second neurotomy was performed for each target around the S1 and S2 foramen. After the completion of the respective lesions, 1 ml of injectate was delivered.  Appropriate radiographs were obtained to verify the probe placement during the neurotomy.  Additional Comments:  The patient tolerated the procedure well No complications occurred.  Interestingly asked to prescription during ramp up to temperature seem to cause him some discomfort which was not really a burning sensation but just more discomfort despite copious numbing medication.  We did reposition just a few millimeters differently and he did well after that. Dressing: Band-Aid    Post-procedure details: Patient was observed during the procedure. Post-procedure instructions were reviewed.  Patient left the clinic in stable condition.

## 2017-12-24 NOTE — Patient Instructions (Signed)

## 2017-12-25 ENCOUNTER — Telehealth: Payer: Self-pay

## 2017-12-25 DIAGNOSIS — E119 Type 2 diabetes mellitus without complications: Secondary | ICD-10-CM

## 2017-12-25 MED ORDER — METFORMIN HCL 1000 MG PO TABS: 1000.0000 mg | ORAL_TABLET | Freq: Two times a day (BID) | ORAL | 0 refills | Status: DC

## 2017-12-25 NOTE — Telephone Encounter (Signed)
Received metformin refill request from [place] via fax. Author refilled per protocol.

## 2017-12-26 ENCOUNTER — Other Ambulatory Visit: Payer: Medicare HMO

## 2018-01-07 ENCOUNTER — Telehealth: Payer: Self-pay | Admitting: *Deleted

## 2018-01-07 NOTE — Telephone Encounter (Signed)
Patient call ed and left a message asking about a refill of his controlled substance.  I reviewed his chart and the last clinic note/avs instructed patient to make 1 month follow up appt.  I contacted patient and left a message to have him call our office to make a follow up with [facility] ASAP

## 2018-01-08 ENCOUNTER — Encounter: Payer: Self-pay | Admitting: Registered Nurse

## 2018-01-08 ENCOUNTER — Encounter: Payer: Medicare HMO | Attending: Physical Medicine & Rehabilitation | Admitting: Registered Nurse

## 2018-01-08 VITALS — BP 144/93 | HR 100 | Resp 14 | Ht 71.0 in | Wt 209.0 lb

## 2018-01-08 DIAGNOSIS — Z5181 Encounter for therapeutic drug level monitoring: Secondary | ICD-10-CM

## 2018-01-08 DIAGNOSIS — M5416 Radiculopathy, lumbar region: Secondary | ICD-10-CM

## 2018-01-08 DIAGNOSIS — Z9889 Other specified postprocedural states: Secondary | ICD-10-CM | POA: Insufficient documentation

## 2018-01-08 DIAGNOSIS — M545 Low back pain: Secondary | ICD-10-CM | POA: Insufficient documentation

## 2018-01-08 DIAGNOSIS — F17210 Nicotine dependence, cigarettes, uncomplicated: Secondary | ICD-10-CM | POA: Diagnosis not present

## 2018-01-08 DIAGNOSIS — G8929 Other chronic pain: Secondary | ICD-10-CM | POA: Insufficient documentation

## 2018-01-08 DIAGNOSIS — E119 Type 2 diabetes mellitus without complications: Secondary | ICD-10-CM | POA: Insufficient documentation

## 2018-01-08 DIAGNOSIS — Z79891 Long term (current) use of opiate analgesic: Secondary | ICD-10-CM

## 2018-01-08 DIAGNOSIS — I10 Essential (primary) hypertension: Secondary | ICD-10-CM | POA: Insufficient documentation

## 2018-01-08 DIAGNOSIS — Z981 Arthrodesis status: Secondary | ICD-10-CM | POA: Diagnosis not present

## 2018-01-08 DIAGNOSIS — M7061 Trochanteric bursitis, right hip: Secondary | ICD-10-CM | POA: Diagnosis not present

## 2018-01-08 DIAGNOSIS — G894 Chronic pain syndrome: Secondary | ICD-10-CM | POA: Diagnosis not present

## 2018-01-08 DIAGNOSIS — M961 Postlaminectomy syndrome, not elsewhere classified: Secondary | ICD-10-CM | POA: Insufficient documentation

## 2018-01-08 MED ORDER — OXYCODONE-ACETAMINOPHEN 10-325 MG PO TABS: ORAL_TABLET | ORAL | 0 refills | Status: DC

## 2018-01-08 NOTE — Progress Notes (Signed)
Subjective:    Patient ID: Jeffrey Austin, male    DOB: 1960-12-23, 57 y.o.   MRN: [id number]  HPI: Mr. Abdulhadi Stopa is a 57 year old male who returns for follow up appointment for chronic pain and medication refill. He states his pain is located in his lower back mainly right side radiating into his right hip and right lower extremity. He rates his pain 8. His current exercise regimen is walking 45 minutes daily.   Mr. Ernestina Patches had Lumbar Radiofrequency on 12/24/2017, with relief noted he reports.   Mr. Brendle Morphine Equivalent is 35.00 MME. Last Oral Swab was Performed on 11/11/2017, it was consistent.   Pain Inventory Average Pain 8 Pain Right Now 8 My pain is sharp, burning, stabbing, tingling and aching  In the last 24 hours, has pain interfered with the following? General activity 8 Relation with others 8 Enjoyment of life 8 What TIME of day is your pain at its worst? all Sleep (in general) Fair  Pain is worse with: walking, bending, sitting, standing and some activites Pain improves with: rest and medication Relief from Meds: 5  Mobility walk without assistance  Function Do you have any goals in this area?  no  Neuro/Psych No problems in this area  Prior Studies Any changes since last visit?  no  Physicians involved in your care Any changes since last visit?  no   Family History  Problem Relation Age of Onset  . Leukemia Father    Social History   Socioeconomic History  . Marital status: Married    Spouse name: Not on file  . Number of children: Not on file  . Years of education: Not on file  . Highest education level: Not on file  Occupational History  . Not on file  Social Needs  . Financial resource strain: Not on file  . Food insecurity:    Worry: Not on file    Inability: Not on file  . Transportation needs:    Medical: Not on file    Non-medical: Not on file  Tobacco Use  . Smoking status: Current Some Day Smoker  . Smokeless  tobacco: Never Used  Substance and Sexual Activity  . Alcohol use: Yes    Alcohol/week: 0.0 standard drinks    Comment: 3-4 times weekly  . Drug use: No  . Sexual activity: Yes  Lifestyle  . Physical activity:    Days per week: Not on file    Minutes per session: Not on file  . Stress: Not on file  Relationships  . Social connections:    Talks on phone: Not on file    Gets together: Not on file    Attends religious service: Not on file    Active member of club or organization: Not on file    Attends meetings of clubs or organizations: Not on file    Relationship status: Not on file  Other Topics Concern  . Not on file  Social History Narrative  . Not on file   Past Surgical History:  Procedure Laterality Date  . cyst removed from testicle     . INCISIONAL HERNIA REPAIR N/A 02/01/2016   Procedure: LAPAROSCOPIC INCISIONAL HERNIA REPAIR;  Surgeon: Arta Bruce Kinsinger, MD;  Location: [facility];  Service: General;  Laterality: N/A;  . INSERTION OF MESH N/A 02/01/2016   Procedure: INSERTION OF MESH;  Surgeon: Arta Bruce Kinsinger, MD;  Location: [facility];  Service: General;  Laterality: N/A;  . LUMBAR FUSION    .  SPINAL FUSION     Past Medical History:  Diagnosis Date  . Back pain   . Diabetes mellitus without complication ([place])   . Hypertension    BP (!) 153/100   Pulse 98   Resp 14   Ht 5\' 11"  (1.803 m)   Wt 209 lb (94.8 kg)   SpO2 98%   BMI 29.15 kg/m   Opioid Risk Score:   Fall Risk Score:  `1  Depression screen PHQ 2/9  Depression screen [facility] 2/9 12/09/2017 09/02/2017  Decreased Interest 0 0  Down, Depressed, Hopeless 0 0  PHQ - 2 Score 0 0    Review of Systems  Constitutional: Negative.   HENT: Negative.   Eyes: Negative.   Respiratory: Negative.   Cardiovascular: Negative.   Gastrointestinal: Negative.   Endocrine: Negative.   Genitourinary: Negative.   Musculoskeletal: Positive for back pain.  Skin: Negative.   Allergic/Immunologic: Negative.     Neurological: Negative.   Hematological: Negative.   Psychiatric/Behavioral: Negative.   All other systems reviewed and are negative.      Objective:   Physical Exam  Constitutional: He is oriented to person, place, and time. He appears well-developed and well-nourished.  HENT:  Head: Normocephalic and atraumatic.  Neck: Normal range of motion. Neck supple.  Cardiovascular: Normal rate and regular rhythm.  Pulmonary/Chest: Effort normal and breath sounds normal.  Musculoskeletal:  Normal Muscle Bulk and Muscle Testing Reveals: Upper Extremities: Full ROM and Muscle Strength 5/5 Lumbar Paraspinal Tenderness: L-3-L-5 Right Greater Trochanter Tenderness Lower Extremities: Full ROM and Muscle Strength 5/5 Arises from chair with ease Narrow Based Gait  Neurological: He is alert and oriented to person, place, and time.  Skin: Skin is warm and dry.  Psychiatric: He has a normal mood and affect. His behavior is normal.  Nursing note and vitals reviewed.         Assessment & Plan:  1. Post Laminectomy Syndrome: Continue current medication regimen and HEP as tolerated. 01/08/2018. 2. Chronic Lumbar Radiculopathy: S/P Lumbar Radiofrequency with Dr. Ernestina Patches on 12/24/2017, with relief noted.  Continue to Monitor. 01/08/2018. 3. Chronic pain Syndrome: Refilled: Oxycodone 10/325 mg one tablet every 12 hours may take an extra dose with increased activity. #70. 01/08/2018. 4.Right Greater Trochanteric Bursitis: Continue to Alternate Ice and Heat Therapy.   20 minutes of face to face patient care time was spent during this visit. All questions were encouraged and answered.  F/U in 1 month

## 2018-02-07 ENCOUNTER — Encounter: Payer: Self-pay | Admitting: Registered Nurse

## 2018-02-07 ENCOUNTER — Encounter: Payer: Medicare HMO | Attending: Physical Medicine & Rehabilitation | Admitting: Registered Nurse

## 2018-02-07 VITALS — BP 135/89 | HR 93 | Ht 70.0 in | Wt 213.0 lb

## 2018-02-07 DIAGNOSIS — Z981 Arthrodesis status: Secondary | ICD-10-CM | POA: Diagnosis not present

## 2018-02-07 DIAGNOSIS — G894 Chronic pain syndrome: Secondary | ICD-10-CM

## 2018-02-07 DIAGNOSIS — M961 Postlaminectomy syndrome, not elsewhere classified: Secondary | ICD-10-CM | POA: Diagnosis not present

## 2018-02-07 DIAGNOSIS — M545 Low back pain: Secondary | ICD-10-CM

## 2018-02-07 DIAGNOSIS — E119 Type 2 diabetes mellitus without complications: Secondary | ICD-10-CM | POA: Insufficient documentation

## 2018-02-07 DIAGNOSIS — M62838 Other muscle spasm: Secondary | ICD-10-CM

## 2018-02-07 DIAGNOSIS — Z9889 Other specified postprocedural states: Secondary | ICD-10-CM | POA: Diagnosis not present

## 2018-02-07 DIAGNOSIS — I10 Essential (primary) hypertension: Secondary | ICD-10-CM | POA: Insufficient documentation

## 2018-02-07 DIAGNOSIS — G8929 Other chronic pain: Secondary | ICD-10-CM | POA: Diagnosis not present

## 2018-02-07 DIAGNOSIS — Z79891 Long term (current) use of opiate analgesic: Secondary | ICD-10-CM

## 2018-02-07 DIAGNOSIS — M5416 Radiculopathy, lumbar region: Secondary | ICD-10-CM

## 2018-02-07 DIAGNOSIS — Z5181 Encounter for therapeutic drug level monitoring: Secondary | ICD-10-CM

## 2018-02-07 DIAGNOSIS — F17210 Nicotine dependence, cigarettes, uncomplicated: Secondary | ICD-10-CM | POA: Insufficient documentation

## 2018-02-07 MED ORDER — OXYCODONE-ACETAMINOPHEN 10-325 MG PO TABS: ORAL_TABLET | ORAL | 0 refills | Status: DC

## 2018-02-07 MED ORDER — CYCLOBENZAPRINE HCL 5 MG PO TABS: 5.0000 mg | ORAL_TABLET | Freq: Every day | ORAL | 1 refills | Status: DC | PRN

## 2018-02-07 NOTE — Progress Notes (Signed)
Subjective:    Patient ID: Jeffrey Austin, male    DOB: 1960/08/25, 57 y.o.   MRN: [id number]  HPI: Jeffrey Austin is a 57 y.o. male who returns for follow up appointment for chronic pain and medication refill. He states his  pain is located in his lower back radiating into his bilateral lower extremities. Also reports occasional muscle spasms in his right lower extremity, will prescribe flexeril, he states he had in the past with relief noted. He verbalizes understanding. He rates his pain 7. His  current exercise regime is walking and performing stretching exercises.   Jeffrey Austin Morphine equivalent is 35.00  MME.  Last Oral Swab was Performed on 11/11/17, it was consistent.   Pain Inventory Average Pain 7 Pain Right Now 7 My pain is sharp, stabbing, tingling and aching  In the last 24 hours, has pain interfered with the following? General activity 0 Relation with others 0 Enjoyment of life 0 What TIME of day is your pain at its worst? . Sleep (in general) Fair  Pain is worse with: bending, sitting and inactivity Pain improves with: heat/ice and pacing activities Relief from Meds: 9  Mobility Do you have any goals in this area?  no  Function Do you have any goals in this area?  no  Neuro/Psych No problems in this area  Prior Studies Any changes since last visit?  no  Physicians involved in your care Any changes since last visit?  no   Family History  Problem Relation Age of Onset  . Leukemia Father    Social History   Socioeconomic History  . Marital status: Married    Spouse name: Not on file  . Number of children: Not on file  . Years of education: Not on file  . Highest education level: Not on file  Occupational History  . Not on file  Social Needs  . Financial resource strain: Not on file  . Food insecurity:    Worry: Not on file    Inability: Not on file  . Transportation needs:    Medical: Not on file    Non-medical: Not on file  Tobacco  Use  . Smoking status: Current Some Day Smoker  . Smokeless tobacco: Never Used  Substance and Sexual Activity  . Alcohol use: Yes    Alcohol/week: 0.0 standard drinks    Comment: 3-4 times weekly  . Drug use: No  . Sexual activity: Yes  Lifestyle  . Physical activity:    Days per week: Not on file    Minutes per session: Not on file  . Stress: Not on file  Relationships  . Social connections:    Talks on phone: Not on file    Gets together: Not on file    Attends religious service: Not on file    Active member of club or organization: Not on file    Attends meetings of clubs or organizations: Not on file    Relationship status: Not on file  Other Topics Concern  . Not on file  Social History Narrative  . Not on file   Past Surgical History:  Procedure Laterality Date  . cyst removed from testicle     . INCISIONAL HERNIA REPAIR N/A 02/01/2016   Procedure: LAPAROSCOPIC INCISIONAL HERNIA REPAIR;  Surgeon: Arta Bruce Kinsinger, MD;  Location: [facility];  Service: General;  Laterality: N/A;  . INSERTION OF MESH N/A 02/01/2016   Procedure: INSERTION OF MESH;  Surgeon: Mickeal Skinner, MD;  Location: [facility];  Service: General;  Laterality: N/A;  . LUMBAR FUSION    . SPINAL FUSION     Past Medical History:  Diagnosis Date  . Back pain   . Diabetes mellitus without complication ([place])   . Hypertension    BP 135/89   Pulse 93   Ht 5\' 10"  (1.778 m)   Wt 213 lb (96.6 kg)   SpO2 98%   BMI 30.56 kg/m   Opioid Risk Score:   Fall Risk Score:  `1  Depression screen PHQ 2/9  Depression screen [facility] 2/9 12/09/2017 09/02/2017  Decreased Interest 0 0  Down, Depressed, Hopeless 0 0  PHQ - 2 Score 0 0     Review of Systems  Constitutional: Negative.   HENT: Negative.   Eyes: Negative.   Respiratory: Negative.   Cardiovascular: Negative.   Gastrointestinal: Negative.   Endocrine: Negative.   Genitourinary: Negative.   Musculoskeletal: Positive for arthralgias, back pain  and myalgias.  Skin: Negative.   Allergic/Immunologic: Negative.   Neurological: Negative.   Hematological: Negative.   Psychiatric/Behavioral: Negative.   All other systems reviewed and are negative.      Objective:   Physical Exam  Constitutional: He is oriented to person, place, and time. He appears well-developed and well-nourished.  HENT:  Head: Normocephalic and atraumatic.  Neck: Normal range of motion. Neck supple.  Cardiovascular: Normal rate and regular rhythm.  Pulmonary/Chest: Effort normal and breath sounds normal.  Musculoskeletal:  Normal Muscle Bulk and Muscle Testing Reveals:  Upper Extremities:Full ROM and Muscle Strength 5/5 , Lumbar Paraspinal Tenderness: L-3-L-5  Lower Extremities: Right: Decreased ROM and Muscle Strength 5/5 Right Lower Extremity Flexion Produce Pain into Right Hip Left: Full ROM and Muscle Strength 5/5 Normal Based Gait   Neurological: He is alert and oriented to person, place, and time.  Skin: Skin is warm and dry.  Psychiatric: He has a normal mood and affect. His behavior is normal.  Nursing note and vitals reviewed.         Assessment & Plan:  1. Post Laminectomy Syndrome: Continue current medication regimen and HEP as tolerated. 02/07/2018. 2. Chronic Lumbar Radiculopathy: S/P Lumbar Radiofrequency with Dr. Ernestina Patches on 12/24/2017, with relief noted.  Continue to Monitor. 02/07/2018. 3. Chronic pain Syndrome: Refilled: Oxycodone 10/325 mg one tablet every 12 hours may take an extra dose with increased activity. #70. 02/07/2018. 4.Right Greater Trochanteric Bursitis: No complaints today.Continue to Alternate Ice and Heat Therapy.  5. Muscle Spasm: RX: Flexeril. Continue to Monitor.   66minutes of face to face patient care time was spent during this visit. All questions were encouraged and answered.  F/U in 1 month

## 2018-03-11 ENCOUNTER — Encounter: Payer: Medicare HMO | Attending: Physical Medicine & Rehabilitation | Admitting: Registered Nurse

## 2018-03-11 ENCOUNTER — Encounter: Payer: Self-pay | Admitting: Registered Nurse

## 2018-03-11 VITALS — BP 149/98 | HR 90 | Ht 70.0 in | Wt 214.0 lb

## 2018-03-11 DIAGNOSIS — F17210 Nicotine dependence, cigarettes, uncomplicated: Secondary | ICD-10-CM | POA: Diagnosis not present

## 2018-03-11 DIAGNOSIS — M545 Low back pain, unspecified: Secondary | ICD-10-CM

## 2018-03-11 DIAGNOSIS — G8929 Other chronic pain: Secondary | ICD-10-CM | POA: Diagnosis not present

## 2018-03-11 DIAGNOSIS — Z5181 Encounter for therapeutic drug level monitoring: Secondary | ICD-10-CM | POA: Diagnosis not present

## 2018-03-11 DIAGNOSIS — I10 Essential (primary) hypertension: Secondary | ICD-10-CM | POA: Insufficient documentation

## 2018-03-11 DIAGNOSIS — M5416 Radiculopathy, lumbar region: Secondary | ICD-10-CM

## 2018-03-11 DIAGNOSIS — R69 Illness, unspecified: Secondary | ICD-10-CM | POA: Diagnosis not present

## 2018-03-11 DIAGNOSIS — Z9889 Other specified postprocedural states: Secondary | ICD-10-CM | POA: Diagnosis not present

## 2018-03-11 DIAGNOSIS — M62838 Other muscle spasm: Secondary | ICD-10-CM

## 2018-03-11 DIAGNOSIS — E119 Type 2 diabetes mellitus without complications: Secondary | ICD-10-CM | POA: Insufficient documentation

## 2018-03-11 DIAGNOSIS — Z981 Arthrodesis status: Secondary | ICD-10-CM | POA: Insufficient documentation

## 2018-03-11 DIAGNOSIS — M961 Postlaminectomy syndrome, not elsewhere classified: Secondary | ICD-10-CM | POA: Diagnosis not present

## 2018-03-11 DIAGNOSIS — Z79891 Long term (current) use of opiate analgesic: Secondary | ICD-10-CM | POA: Diagnosis not present

## 2018-03-11 MED ORDER — METHYLPREDNISOLONE 4 MG PO TBPK: ORAL_TABLET | ORAL | 0 refills | Status: DC

## 2018-03-11 MED ORDER — OXYCODONE-ACETAMINOPHEN 10-325 MG PO TABS: ORAL_TABLET | ORAL | 0 refills | Status: DC

## 2018-03-11 NOTE — Progress Notes (Signed)
Subjective:    Patient ID: Jeffrey Austin, male    DOB: 1960/07/30, 58 y.o.   MRN: [id number]  HPI: Jeffrey Austin is a 58 y.o. male who returns for follow up appointment for chronic pain and medication refill. He states his pain is located in his upper- lower back radiating into his bilateral lower extremities. Also reports increase intensity of pain with the weather change for the last few days. He rates his pain 9. His  current exercise regime is walking.  Ms. Smeltz Morphine equivalent is 35.00  MME.  Last Oral Swab was Performed on 11/11/2017, it was consistent.   Pain Inventory Average Pain 6 Pain Right Now 9 My pain is sharp and stabbing  In the last 24 hours, has pain interfered with the following? General activity 10 Relation with others 2 Enjoyment of life 5 What TIME of day is your pain at its worst? night Sleep (in general) Poor  Pain is worse with: bending, sitting and inactivity Pain improves with: heat/ice and medication Relief from Meds: 7  Mobility walk without assistance  Function Do you have any goals in this area?  no  Neuro/Psych No problems in this area  Prior Studies Any changes since last visit?  no  Physicians involved in your care Any changes since last visit?  no   Family History  Problem Relation Age of Onset  . Leukemia Father    Social History   Socioeconomic History  . Marital status: Married    Spouse name: Not on file  . Number of children: Not on file  . Years of education: Not on file  . Highest education level: Not on file  Occupational History  . Not on file  Social Needs  . Financial resource strain: Not on file  . Food insecurity:    Worry: Not on file    Inability: Not on file  . Transportation needs:    Medical: Not on file    Non-medical: Not on file  Tobacco Use  . Smoking status: Current Some Day Smoker  . Smokeless tobacco: Never Used  Substance and Sexual Activity  . Alcohol use: Yes   Alcohol/week: 0.0 standard drinks    Comment: 3-4 times weekly  . Drug use: No  . Sexual activity: Yes  Lifestyle  . Physical activity:    Days per week: Not on file    Minutes per session: Not on file  . Stress: Not on file  Relationships  . Social connections:    Talks on phone: Not on file    Gets together: Not on file    Attends religious service: Not on file    Active member of club or organization: Not on file    Attends meetings of clubs or organizations: Not on file    Relationship status: Not on file  Other Topics Concern  . Not on file  Social History Narrative  . Not on file   Past Surgical History:  Procedure Laterality Date  . cyst removed from testicle     . INCISIONAL HERNIA REPAIR N/A 02/01/2016   Procedure: LAPAROSCOPIC INCISIONAL HERNIA REPAIR;  Surgeon: Arta Bruce Kinsinger, MD;  Location: [facility];  Service: General;  Laterality: N/A;  . INSERTION OF MESH N/A 02/01/2016   Procedure: INSERTION OF MESH;  Surgeon: Arta Bruce Kinsinger, MD;  Location: [facility];  Service: General;  Laterality: N/A;  . LUMBAR FUSION    . SPINAL FUSION     Past Medical History:  Diagnosis  Date  . Back pain   . Diabetes mellitus without complication ([place])   . Hypertension    BP (!) 158/100   Pulse 99   Ht 5\' 10"  (1.778 m)   Wt 214 lb (97.1 kg)   SpO2 98%   BMI 30.71 kg/m   Opioid Risk Score:   Fall Risk Score:  `1  Depression screen PHQ 2/9  Depression screen [facility] 2/9 12/09/2017 09/02/2017  Decreased Interest 0 0  Down, Depressed, Hopeless 0 0  PHQ - 2 Score 0 0     Review of Systems  Constitutional: Negative.   HENT: Negative.   Eyes: Negative.   Respiratory: Negative.   Cardiovascular: Negative.   Gastrointestinal: Negative.   Endocrine: Negative.   Genitourinary: Negative.   Musculoskeletal: Positive for arthralgias, back pain and myalgias.  Skin: Negative.   Allergic/Immunologic: Negative.   Neurological: Negative.   Hematological: Negative.     Psychiatric/Behavioral: Negative.   All other systems reviewed and are negative.      Objective:   Physical Exam Vitals signs and nursing note reviewed.  Constitutional:      Appearance: Normal appearance.  Neck:     Musculoskeletal: Normal range of motion and neck supple.  Cardiovascular:     Rate and Rhythm: Normal rate and regular rhythm.     Pulses: Normal pulses.     Heart sounds: Normal heart sounds.  Pulmonary:     Effort: Pulmonary effort is normal.     Breath sounds: Normal breath sounds.  Musculoskeletal:     Comments: Normal Muscle Bulk and Muscle Testing Reveals:  Upper Extremities: Full ROM and Muscle Strength 5/5 Thoracic and  Lumbar Hypersensitivity Lower Extremities: Right: Decreased ROM and Muscle Strength 5/5 Right Lower Extremity Flexion Produces Pain into Extremity Left Lower Extremity: Full ROM and Muscle Strength 5/5 Left Lower Extremity Flexion Produces Pain into Lumbar and Left Lower Extremity Arises from chair slowly Antalgic Gait   Skin:    General: Skin is warm and dry.  Neurological:     Mental Status: He is alert and oriented to person, place, and time.  Psychiatric:        Mood and Affect: Mood normal.        Behavior: Behavior normal.           Assessment & Plan:  1. Acute Exacerbation of Chronic Low Back Pain: RX: Medrol dose Pak 2. Post Laminectomy Syndrome: Continue current medication regimen and HEP as tolerated.03/11/2018. 3. Chronic Lumbar Radiculopathy:S/P Lumbar Radiofrequency with Dr. Ernestina Patches on 12/24/2017, with relief noted.Continue to Monitor.03/11/2018. 4. Chronic pain Syndrome: Refilled: Increased: Oxycodone 10/325 mg one tablet every 8 hours as needed for pain  #80.03/11/2018. 5.Right Greater Trochanteric Bursitis: No complaints today.Continue to Alternate Ice and Heat Therapy.03/11/2018. 6. Muscle Spasm: Continue  Flexeril as needed. . Continue to Monitor.   62minutes of face to face patient care time was spent  during this visit. All questions were encouraged and answered.  F/U in 1 month

## 2018-03-17 LAB — DRUG TOX MONITOR 1 W/CONF, ORAL FLD
AMPHETAMINES: NEGATIVE ng/mL (ref <10)
BARBITURATES: NEGATIVE ng/mL (ref <10)
Benzodiazepines: NEGATIVE ng/mL (ref <0.50)
Buprenorphine: NEGATIVE ng/mL (ref <0.10)
COCAINE: NEGATIVE ng/mL (ref <5.0)
CODEINE: NEGATIVE ng/mL (ref <2.5)
DIHYDROCODEINE: NEGATIVE ng/mL (ref <2.5)
Fentanyl: NEGATIVE ng/mL (ref <0.10)
HYDROCODONE: NEGATIVE ng/mL (ref <2.5)
Heroin Metabolite: NEGATIVE ng/mL (ref <1.0)
Hydromorphone: NEGATIVE ng/mL (ref <2.5)
MARIJUANA: NEGATIVE ng/mL (ref <2.5)
MDMA: NEGATIVE ng/mL (ref <10)
Meprobamate: NEGATIVE ng/mL (ref <2.5)
Methadone: NEGATIVE ng/mL (ref <5.0)
Morphine: NEGATIVE ng/mL (ref <2.5)
Nicotine Metabolite: POSITIVE ng/mL — AB (ref <5.0)
Norhydrocodone: NEGATIVE ng/mL (ref <2.5)
Noroxycodone: 27.5 ng/mL — ABNORMAL HIGH (ref <2.5)
OXYMORPHONE: NEGATIVE ng/mL (ref <2.5)
Opiates: POSITIVE ng/mL — AB (ref <2.5)
Oxycodone: >250 ng/mL — ABNORMAL HIGH (ref <2.5)
PHENCYCLIDINE: NEGATIVE ng/mL (ref <10)
TAPENTADOL: NEGATIVE ng/mL (ref <5.0)
TRAMADOL: NEGATIVE ng/mL (ref <5.0)
ZOLPIDEM: NEGATIVE ng/mL (ref <5.0)

## 2018-03-17 LAB — DRUG TOX ALC METAB W/CON, ORAL FLD: Alcohol Metabolite: NEGATIVE ng/mL (ref <25)

## 2018-03-18 ENCOUNTER — Telehealth: Payer: Self-pay | Admitting: *Deleted

## 2018-03-18 NOTE — Telephone Encounter (Signed)
Oral swab drug screen was consistent for prescribed medications.  ?

## 2018-03-24 ENCOUNTER — Telehealth: Payer: Self-pay | Admitting: Physical Medicine & Rehabilitation

## 2018-03-24 NOTE — Telephone Encounter (Signed)
Pt is requesting a letter stating he can not sit for jury duty.

## 2018-03-28 NOTE — Telephone Encounter (Signed)
Pt calling requesting update on letter.

## 2018-04-08 ENCOUNTER — Telehealth: Payer: Self-pay | Admitting: *Deleted

## 2018-04-08 ENCOUNTER — Encounter: Payer: Medicare HMO | Attending: Physical Medicine & Rehabilitation

## 2018-04-08 ENCOUNTER — Ambulatory Visit: Payer: Medicare HMO | Admitting: Physical Medicine & Rehabilitation

## 2018-04-08 ENCOUNTER — Encounter: Payer: Self-pay | Admitting: Physical Medicine & Rehabilitation

## 2018-04-08 DIAGNOSIS — M545 Low back pain: Secondary | ICD-10-CM | POA: Insufficient documentation

## 2018-04-08 DIAGNOSIS — I10 Essential (primary) hypertension: Secondary | ICD-10-CM | POA: Insufficient documentation

## 2018-04-08 DIAGNOSIS — Z981 Arthrodesis status: Secondary | ICD-10-CM | POA: Insufficient documentation

## 2018-04-08 DIAGNOSIS — F17210 Nicotine dependence, cigarettes, uncomplicated: Secondary | ICD-10-CM | POA: Insufficient documentation

## 2018-04-08 DIAGNOSIS — R69 Illness, unspecified: Secondary | ICD-10-CM | POA: Diagnosis not present

## 2018-04-08 DIAGNOSIS — E119 Type 2 diabetes mellitus without complications: Secondary | ICD-10-CM | POA: Diagnosis not present

## 2018-04-08 DIAGNOSIS — G8929 Other chronic pain: Secondary | ICD-10-CM | POA: Insufficient documentation

## 2018-04-08 DIAGNOSIS — Z9889 Other specified postprocedural states: Secondary | ICD-10-CM | POA: Insufficient documentation

## 2018-04-08 DIAGNOSIS — M961 Postlaminectomy syndrome, not elsewhere classified: Secondary | ICD-10-CM | POA: Insufficient documentation

## 2018-04-08 MED ORDER — OXYCODONE-ACETAMINOPHEN 10-325 MG PO TABS: ORAL_TABLET | ORAL | 0 refills | Status: DC

## 2018-04-08 NOTE — Progress Notes (Signed)
Subjective:    Patient ID: Jeffrey Austin, male    DOB: 01/04/1961, 58 y.o.   MRN: [id number]  HPI Left sided buttocks pain more noticeable since Right sacroiliac RF, at first was unsure whether the right sacroiliac RF was helpful but his wife tells him he has been more active.  He did clean the whole house for her but ' paid for it' Patient feels like his medications are helpful but tend to wear off in the middle of the night.  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 General activity 0 Relation with others 0 Enjoyment of life 0 What TIME of day is your pain at its worst? night Sleep (in general) Fair  Pain is worse with: walking and some activites Pain improves with: medication Relief from Meds: 5  Mobility walk without assistance ability to climb steps?  no do you drive?  yes  Function disabled: date disabled 2018 I need assistance with the following:  household duties and shopping  Neuro/Psych No problems in this area  Prior Studies Any changes since last visit?  no  Physicians involved in your care Any changes since last visit?  no   Family History  Problem Relation Age of Onset  . Leukemia Father    Social History   Socioeconomic History  . Marital status: Married    Spouse name: Not on file  . Number of children: Not on file  . Years of education: Not on file  . Highest education level: Not on file  Occupational History  . Not on file  Social Needs  . Financial resource strain: Not on file  . Food insecurity:    Worry: Not on file    Inability: Not on file  . Transportation needs:    Medical: Not on file    Non-medical: Not on file  Tobacco Use  . Smoking status: Current Some Day Smoker  . Smokeless tobacco: Never Used  Substance and Sexual Activity  . Alcohol use: Yes    Alcohol/week: 0.0  standard drinks    Comment: 3-4 times weekly  . Drug use: No  . Sexual activity: Yes  Lifestyle  . Physical activity:    Days per week: Not on file    Minutes per session: Not on file  . Stress: Not on file  Relationships  . Social connections:    Talks on phone: Not on file    Gets together: Not on file    Attends religious service: Not on file    Active member of club or organization: Not on file    Attends meetings of clubs or organizations: Not on file    Relationship status: Not on file  Other Topics Concern  . Not on file  Social History Narrative  . Not on file   Past Surgical History:  Procedure Laterality Date  . cyst removed from testicle     . INCISIONAL HERNIA REPAIR N/A 02/01/2016   Procedure: LAPAROSCOPIC INCISIONAL HERNIA REPAIR;  Surgeon: Arta Bruce Kinsinger, MD;  Location: [facility];  Service: General;  Laterality: N/A;  . INSERTION OF MESH N/A 02/01/2016   Procedure: INSERTION OF MESH;  Surgeon: Arta Bruce Kinsinger, MD;  Location: [facility];  Service: General;  Laterality: N/A;  . LUMBAR FUSION    . SPINAL FUSION     Past Medical History:  Diagnosis Date  . Back pain   . Diabetes mellitus without complication ([place])   . Hypertension    BP (!) 157/99   Pulse 86   Ht 5\' 11"  (1.803 m)   Wt 215 lb (97.5 kg)   SpO2 97%   BMI 29.99 kg/m   Opioid Risk Score:   Fall Risk Score:  `1  Depression screen PHQ 2/9  Depression screen [facility] 2/9 12/09/2017 09/02/2017  Decreased Interest 0 0  Down, Depressed, Hopeless 0 0  PHQ - 2 Score 0 0     Review of Systems  Constitutional: Negative.   HENT: Negative.   Eyes: Negative.   Respiratory: Negative.   Cardiovascular: Negative.   Gastrointestinal: Negative.   Endocrine: Negative.   Genitourinary: Negative.   Musculoskeletal: Positive for arthralgias and myalgias.  Skin: Negative.   Allergic/Immunologic: Negative.   Neurological: Negative.   Hematological: Negative.   Psychiatric/Behavioral: Negative.   All  other systems reviewed and are negative.      Objective:   Physical Exam Vitals signs and nursing note reviewed.  Constitutional:      Appearance: Normal appearance.  HENT:     Head: Normocephalic and atraumatic.     Nose: No congestion.     Mouth/Throat:     Mouth: Mucous membranes are moist.  Eyes:     Extraocular Movements: Extraocular movements intact.     Conjunctiva/sclera: Conjunctivae normal.     Pupils: Pupils are equal, round, and reactive to light.  Skin:    General: Skin is warm and dry.  Neurological:     General: No focal deficit present.     Mental Status: He is alert and oriented to person, place, and time.  Psychiatric:        Mood and Affect: Mood normal.        Behavior: Behavior normal.   Patient has tenderness palpation at the lumbosacral junction bilaterally He ambulates with a forward flexed posture no evidence of toe drag or knee instability Lumbar range of motion has 75% flexion, extension is 25% 25% lateral bending and rotation. Motor strength is 5/5 bilateral hip flexor knee extension ankle dorsiflexion       Assessment & Plan:  1.  Lumbar postlaminectomy syndrome status post lumbar fusion  Continue oxycodone 10 mg twice daily and occasionally an extra dose in the evening no more than 80 pills/month. Given his problem with waking up in pain at night he may benefit from a longer lasting preparation such as Xtampza ER 9 mg twice daily.  He will think about it, Last UDS 03/11/2018 which was consistent  2.  Sacroiliac dysfunction improved on the right after sacroiliac radiofrequency neurotomy.  He would benefit from a left-sided radiofrequency neurotomy.  He has new insurance this year he will check on his coverage in the hospital outpatient department setting otherwise he will go back to see Dr. Ernestina Patches for this.

## 2018-04-08 NOTE — Telephone Encounter (Signed)
Jeffrey Austin picked up his letter [name] had written.

## 2018-04-23 ENCOUNTER — Telehealth: Payer: Self-pay | Admitting: Physical Medicine & Rehabilitation

## 2018-04-23 DIAGNOSIS — M5416 Radiculopathy, lumbar region: Secondary | ICD-10-CM

## 2018-04-23 DIAGNOSIS — M533 Sacrococcygeal disorders, not elsewhere classified: Secondary | ICD-10-CM

## 2018-04-23 DIAGNOSIS — G8929 Other chronic pain: Secondary | ICD-10-CM

## 2018-04-23 NOTE — Telephone Encounter (Signed)
Dr Ernestina Patches does not prescribe opiates on a chronic basis, I can refer him to [facility] in [place] if he wants soemone who can do both medication and injections

## 2018-04-24 NOTE — Telephone Encounter (Signed)
Left a message stating Dr. Letta Pate position regarding referral. Advised patient to call back

## 2018-04-25 NOTE — Telephone Encounter (Signed)
I don't think it is necessary but fine

## 2018-04-25 NOTE — Telephone Encounter (Addendum)
Patient called back to clarify that he is wanting to continue regular follow up visits with Dr. Letta Pate and Danella Sensing, ANP.  He is was told by Dr. Kennon Portela office that he needs another referral for injections even though he had a radiofrequency procedure within the last 6 months.  He is asking for a anothere referral for injection visits only to Dr. Ernestina Patches

## 2018-04-29 NOTE — Telephone Encounter (Signed)
Referral placed.

## 2018-05-01 ENCOUNTER — Telehealth (INDEPENDENT_AMBULATORY_CARE_PROVIDER_SITE_OTHER): Payer: Self-pay | Admitting: *Deleted

## 2018-05-01 NOTE — Telephone Encounter (Signed)
Submitted clinical notes for PA for [phone] on [employer] website.

## 2018-05-06 ENCOUNTER — Encounter: Payer: Self-pay | Admitting: Registered Nurse

## 2018-05-06 ENCOUNTER — Encounter: Payer: Medicare HMO | Attending: Physical Medicine & Rehabilitation | Admitting: Registered Nurse

## 2018-05-06 VITALS — BP 153/98 | HR 100 | Resp 14 | Ht 71.0 in | Wt 213.0 lb

## 2018-05-06 DIAGNOSIS — Z5181 Encounter for therapeutic drug level monitoring: Secondary | ICD-10-CM | POA: Diagnosis not present

## 2018-05-06 DIAGNOSIS — F17210 Nicotine dependence, cigarettes, uncomplicated: Secondary | ICD-10-CM | POA: Diagnosis not present

## 2018-05-06 DIAGNOSIS — R69 Illness, unspecified: Secondary | ICD-10-CM | POA: Diagnosis not present

## 2018-05-06 DIAGNOSIS — E119 Type 2 diabetes mellitus without complications: Secondary | ICD-10-CM | POA: Insufficient documentation

## 2018-05-06 DIAGNOSIS — Z79891 Long term (current) use of opiate analgesic: Secondary | ICD-10-CM | POA: Diagnosis not present

## 2018-05-06 DIAGNOSIS — M545 Low back pain, unspecified: Secondary | ICD-10-CM

## 2018-05-06 DIAGNOSIS — Z981 Arthrodesis status: Secondary | ICD-10-CM | POA: Insufficient documentation

## 2018-05-06 DIAGNOSIS — W19XXXA Unspecified fall, initial encounter: Secondary | ICD-10-CM | POA: Diagnosis not present

## 2018-05-06 DIAGNOSIS — Z9889 Other specified postprocedural states: Secondary | ICD-10-CM | POA: Insufficient documentation

## 2018-05-06 DIAGNOSIS — G8929 Other chronic pain: Secondary | ICD-10-CM | POA: Diagnosis not present

## 2018-05-06 DIAGNOSIS — M961 Postlaminectomy syndrome, not elsewhere classified: Secondary | ICD-10-CM | POA: Insufficient documentation

## 2018-05-06 DIAGNOSIS — G894 Chronic pain syndrome: Secondary | ICD-10-CM

## 2018-05-06 DIAGNOSIS — I10 Essential (primary) hypertension: Secondary | ICD-10-CM

## 2018-05-06 DIAGNOSIS — M5416 Radiculopathy, lumbar region: Secondary | ICD-10-CM | POA: Diagnosis not present

## 2018-05-06 MED ORDER — OXYCODONE-ACETAMINOPHEN 10-325 MG PO TABS: ORAL_TABLET | ORAL | 0 refills | Status: DC

## 2018-05-06 NOTE — Progress Notes (Signed)
Subjective:    Patient ID: Jeffrey Austin, male    DOB: March 15, 1960, 58 y.o.   MRN: [id number]  HPI: Jeffrey Austin is a 58 y.o. male who returns for follow up appointment for chronic pain and medication refill. He states his pain is located in his lower back, is experiencing increase intensity of lower back pain for the last 3 weeks. He reports his dog was running and knocked him against his dresser and he fell backwards and landed on his back, his wife helped him up. He has an appointment with Dr. Ernestina Patches on 05/16/2018, refuses X-ray at this time. He. rates his pain 8.  Hiscurrent exercise regime is walking.  Jeffrey Austin arrived to office hypertensive and reports he is compliant with his anti-hypertensive medication. Blood pressure was rechecked, he was encouraged to keep a blood pressure journal, he verbalizes understanding.   Jeffrey Austin Morphine equivalent is 44.44 MME.  Last Oral Swab was performed on 03/11/2018, it was consistent.    Pain Inventory Average Pain 8 Pain Right Now 8 My pain is sharp, burning, stabbing, tingling and aching  In the last 24 hours, has pain interfered with the following? General activity 8 Relation with others 6 Enjoyment of life 8 What TIME of day is your pain at its worst? morning, evening, night Sleep (in general) Fair  Pain is worse with: bending, sitting, inactivity and standing Pain improves with: heat/ice, medication and injections Relief from Meds: 6  Mobility Do you have any goals in this area?  no  Function Do you have any goals in this area?  no  Neuro/Psych No problems in this area  Prior Studies Any changes since last visit?  no  Physicians involved in your care Any changes since last visit?  no   Family History  Problem Relation Age of Onset  . Leukemia Father    Social History   Socioeconomic History  . Marital status: Married    Spouse name: Not on file  . Number of children: Not on file  . Years of education:  Not on file  . Highest education level: Not on file  Occupational History  . Not on file  Social Needs  . Financial resource strain: Not on file  . Food insecurity:    Worry: Not on file    Inability: Not on file  . Transportation needs:    Medical: Not on file    Non-medical: Not on file  Tobacco Use  . Smoking status: Current Some Day Smoker  . Smokeless tobacco: Never Used  Substance and Sexual Activity  . Alcohol use: Yes    Alcohol/week: 0.0 standard drinks    Comment: 3-4 times weekly  . Drug use: No  . Sexual activity: Yes  Lifestyle  . Physical activity:    Days per week: Not on file    Minutes per session: Not on file  . Stress: Not on file  Relationships  . Social connections:    Talks on phone: Not on file    Gets together: Not on file    Attends religious service: Not on file    Active member of club or organization: Not on file    Attends meetings of clubs or organizations: Not on file    Relationship status: Not on file  Other Topics Concern  . Not on file  Social History Narrative  . Not on file   Past Surgical History:  Procedure Laterality Date  . cyst removed from testicle     .  INCISIONAL HERNIA REPAIR N/A 02/01/2016   Procedure: LAPAROSCOPIC INCISIONAL HERNIA REPAIR;  Surgeon: Arta Bruce Kinsinger, MD;  Location: [facility];  Service: General;  Laterality: N/A;  . INSERTION OF MESH N/A 02/01/2016   Procedure: INSERTION OF MESH;  Surgeon: Arta Bruce Kinsinger, MD;  [facility];  Service: General;  Laterality: N/A;  . LUMBAR FUSION    . SPINAL FUSION     Past Medical History:  Diagnosis Date  . Back pain   . Diabetes mellitus without complication ([place])   . Hypertension    BP (!) 157/102   Pulse 92   Resp 14   Ht 5\' 11"  (1.803 m)   Wt 213 lb (96.6 kg)   SpO2 98%   BMI 29.71 kg/m   Opioid Risk Score:   Fall Risk Score:  `1  Depression screen PHQ 2/9  Depression screen [facility] 2/9 12/09/2017 09/02/2017  Decreased Interest 0 0  Down,  Depressed, Hopeless 0 0  PHQ - 2 Score 0 0    Review of Systems  Constitutional: Negative.   HENT: Negative.   Eyes: Negative.   Respiratory: Negative.   Cardiovascular: Negative.   Gastrointestinal: Negative.   Endocrine: Negative.   Genitourinary: Negative.   Musculoskeletal: Positive for back pain.  Allergic/Immunologic: Negative.   Neurological: Negative.   Hematological: Negative.   All other systems reviewed and are negative.      Objective:   Physical Exam Vitals signs and nursing note reviewed.  Constitutional:      Appearance: Normal appearance.  Neck:     Musculoskeletal: Normal range of motion and neck supple.  Cardiovascular:     Rate and Rhythm: Normal rate and regular rhythm.     Pulses: Normal pulses.     Heart sounds: Normal heart sounds.  Pulmonary:     Effort: Pulmonary effort is normal.     Breath sounds: Normal breath sounds.  Musculoskeletal:     Comments: Normal Muscle Bulk and Muscle Testing Reveals:  Upper Extremities: Full ROM and Muscle Strength 5/5  Lumbar Hypersensitivity Lower Extremities: Full ROM and Muscle Strength 5/5  Arises from chair slowly Antalgic Gait   Skin:    General: Skin is warm and dry.  Neurological:     Mental Status: He is alert and oriented to person, place, and time.  Psychiatric:        Behavior: Behavior normal.           Assessment & Plan:  1. Acute Exacerbation of Chronic Low Back Pain: Jeffrey Austin states he has an appointment with Dr. Ernestina Patches on 05/16/2018. 2. Post Laminectomy Syndrome: Continue current medication regimen and HEP as tolerated.05/06/2018. 3. Chronic Lumbar Radiculopathy:S/P Lumbar Radiofrequency with Dr. Ernestina Patches on 12/24/2017, with relief noted.Continue to Monitor.05/06/2018. 4. Chronic pain Syndrome: Refilled: Oxycodone 10/325 mg one tablet every 8 hours as needed for pain  #80.05/06/2018. 5.Right Greater Trochanteric Bursitis:No complaints today.Continue to Alternate Ice and Heat  Therapy.05/06/2018. 6. Fall: Educated on falls prevention, he verbalizes understanding.   54minutes of face to face patient care time was spent during this visit. All questions were encouraged and answered.  F/U in 1 month

## 2018-05-15 ENCOUNTER — Ambulatory Visit (INDEPENDENT_AMBULATORY_CARE_PROVIDER_SITE_OTHER): Payer: Self-pay | Admitting: Physical Medicine and Rehabilitation

## 2018-06-04 ENCOUNTER — Encounter: Payer: Medicare HMO | Admitting: Registered Nurse

## 2018-06-05 ENCOUNTER — Other Ambulatory Visit: Payer: Self-pay | Admitting: Family Medicine

## 2018-06-05 DIAGNOSIS — E119 Type 2 diabetes mellitus without complications: Secondary | ICD-10-CM

## 2018-06-18 ENCOUNTER — Other Ambulatory Visit: Payer: Self-pay

## 2018-06-18 ENCOUNTER — Encounter: Payer: Self-pay | Admitting: Registered Nurse

## 2018-06-18 ENCOUNTER — Encounter: Payer: Medicare HMO | Attending: Registered Nurse | Admitting: Registered Nurse

## 2018-06-18 VITALS — BP 135/88 | HR 95 | Ht 71.0 in | Wt 213.0 lb

## 2018-06-18 DIAGNOSIS — Z79891 Long term (current) use of opiate analgesic: Secondary | ICD-10-CM

## 2018-06-18 DIAGNOSIS — M5416 Radiculopathy, lumbar region: Secondary | ICD-10-CM | POA: Diagnosis not present

## 2018-06-18 DIAGNOSIS — G8929 Other chronic pain: Secondary | ICD-10-CM

## 2018-06-18 DIAGNOSIS — G894 Chronic pain syndrome: Secondary | ICD-10-CM | POA: Diagnosis not present

## 2018-06-18 DIAGNOSIS — Z5181 Encounter for therapeutic drug level monitoring: Secondary | ICD-10-CM | POA: Diagnosis not present

## 2018-06-18 DIAGNOSIS — M545 Low back pain, unspecified: Secondary | ICD-10-CM

## 2018-06-18 MED ORDER — OXYCODONE-ACETAMINOPHEN 10-325 MG PO TABS: ORAL_TABLET | ORAL | 0 refills | Status: DC

## 2018-06-18 NOTE — Progress Notes (Signed)
Subjective:    Patient ID: Jeffrey Austin, male    DOB: 06/04/1960, 58 y.o.   MRN: [id number]  HPI: Jeffrey Austin is a 58 y.o. male his appointment was changed, due to national recommendations of social distancing due to [place] 19, an audio/video telehealth visit is felt to be most appropriate for this patient at this time.  See Chart message from today for the patient's consent to telehealth from [place].     He states his pain is located in his lower back radiating into his bilateral lower extremities. He rates his pain 7. His  current exercise regime is walking.  Mr. Vanbenschoten Morphine equivalent is 44.44 MME. Last Oral Swab was Performed on 03/11/2018, it was consistent.   Geryl Rankins CMA asked the Health and History questions. This provider and Geryl Rankins verified we were speaking with the correct person using two identifiers.  Pain Inventory Average Pain 7 Pain Right Now 7 My pain is intermittent and sharp  In the last 24 hours, has pain interfered with the following? General activity 8 Relation with others 0 Enjoyment of life 0 What TIME of day is your pain at its worst? morning Sleep (in general) Fair  Pain is worse with: bending, sitting and inactivity Pain improves with: heat/ice and medication Relief from Meds: 5  Mobility walk without assistance how many minutes can you walk? 40 ability to climb steps?  no do you drive?  yes  Function disabled: date disabled .  Neuro/Psych No problems in this area  Prior Studies Any changes since last visit?  no  Physicians involved in your care Any changes since last visit?  no   Family History  Problem Relation Age of Onset  . Leukemia Father    Social History   Socioeconomic History  . Marital status: Married    Spouse name: Not on file  . Number of children: Not on file  . Years of education: Not on file  . Highest education level: Not on file   Occupational History  . Not on file  Social Needs  . Financial resource strain: Not on file  . Food insecurity:    Worry: Not on file    Inability: Not on file  . Transportation needs:    Medical: Not on file    Non-medical: Not on file  Tobacco Use  . Smoking status: Current Some Day Smoker  . Smokeless tobacco: Never Used  Substance and Sexual Activity  . Alcohol use: Yes    Alcohol/week: 0.0 standard drinks    Comment: 3-4 times weekly  . Drug use: No  . Sexual activity: Yes  Lifestyle  . Physical activity:    Days per week: Not on file    Minutes per session: Not on file  . Stress: Not on file  Relationships  . Social connections:    Talks on phone: Not on file    Gets together: Not on file    Attends religious service: Not on file    Active member of club or organization: Not on file    Attends meetings of clubs or organizations: Not on file    Relationship status: Not on file  Other Topics Concern  . Not on file  Social History Narrative  . Not on file   Past Surgical History:  Procedure Laterality Date  . cyst removed from testicle     . INCISIONAL HERNIA REPAIR N/A 02/01/2016   Procedure: LAPAROSCOPIC INCISIONAL HERNIA REPAIR;  Surgeon: Arta Bruce Kinsinger, MD;  Location: [facility];  Service: General;  Laterality: N/A;  . INSERTION OF MESH N/A 02/01/2016   Procedure: INSERTION OF MESH;  Surgeon: Arta Bruce Kinsinger, MD;  Location: [facility];  Service: General;  Laterality: N/A;  . LUMBAR FUSION    . SPINAL FUSION     Past Medical History:  Diagnosis Date  . Back pain   . Diabetes mellitus without complication ([place])   . Hypertension    Ht 5\' 11"  (1.803 m)   Wt 213 lb (96.6 kg)   BMI 29.71 kg/m   Opioid Risk Score:   Fall Risk Score:  `1  Depression screen PHQ 2/9  Depression screen [facility] 2/9 12/09/2017 09/02/2017  Decreased Interest 0 0  Down, Depressed, Hopeless 0 0  PHQ - 2 Score 0 0     Review of Systems  Constitutional: Negative.   HENT:  Negative.   Eyes: Negative.   Respiratory: Negative.   Cardiovascular: Negative.   Gastrointestinal: Negative.   Endocrine: Negative.   Genitourinary: Negative.   Musculoskeletal: Positive for back pain.  Skin: Negative.   Allergic/Immunologic: Negative.   Neurological: Negative.   Hematological: Negative.   Psychiatric/Behavioral: Negative.   All other systems reviewed and are negative.      Objective:   Physical Exam Vitals signs and nursing note reviewed.  Musculoskeletal:     Comments: No Physical Exam Performed: Virtual Visit  Neurological:     Mental Status: He is oriented to person, place, and time.           Assessment & Plan:  1. Post Laminectomy Syndrome: Continue current medication regimen and HEP as tolerated.06/18/2018. 2. Chronic Lumbar Radiculopathy:S/P Lumbar Radiofrequency with Dr. Ernestina Patches on 12/24/2017, with relief noted.Continue to Monitor.06/18/2018. 3. Chronic pain Syndrome: Refilled: Oxycodone 10/325 mg one tablet every 8 hours as needed for moderate pain.#80. 06/18/2018. 4.Right Greater Trochanteric Bursitis: No complaints today.Continue to Alternate Ice and Heat Therapy.06/18/2018 F/U in 1 month Telephone call Location of patient: In his home Location of provider: Office Established patient Time spent on call: 10 minutes

## 2018-06-23 ENCOUNTER — Encounter: Payer: Medicare HMO | Admitting: Family Medicine

## 2018-07-18 ENCOUNTER — Encounter: Payer: Medicare HMO | Attending: Physical Medicine & Rehabilitation | Admitting: Registered Nurse

## 2018-07-18 DIAGNOSIS — Z981 Arthrodesis status: Secondary | ICD-10-CM | POA: Insufficient documentation

## 2018-07-18 DIAGNOSIS — I10 Essential (primary) hypertension: Secondary | ICD-10-CM | POA: Insufficient documentation

## 2018-07-18 DIAGNOSIS — Z9889 Other specified postprocedural states: Secondary | ICD-10-CM | POA: Insufficient documentation

## 2018-07-18 DIAGNOSIS — E119 Type 2 diabetes mellitus without complications: Secondary | ICD-10-CM | POA: Insufficient documentation

## 2018-07-18 DIAGNOSIS — F17210 Nicotine dependence, cigarettes, uncomplicated: Secondary | ICD-10-CM | POA: Insufficient documentation

## 2018-07-18 DIAGNOSIS — M545 Low back pain: Secondary | ICD-10-CM | POA: Insufficient documentation

## 2018-07-18 DIAGNOSIS — M961 Postlaminectomy syndrome, not elsewhere classified: Secondary | ICD-10-CM | POA: Insufficient documentation

## 2018-07-18 DIAGNOSIS — G8929 Other chronic pain: Secondary | ICD-10-CM | POA: Insufficient documentation

## 2018-08-06 ENCOUNTER — Other Ambulatory Visit: Payer: Self-pay

## 2018-08-06 ENCOUNTER — Encounter: Payer: Medicare HMO | Attending: Physical Medicine & Rehabilitation | Admitting: Registered Nurse

## 2018-08-06 ENCOUNTER — Encounter: Payer: Self-pay | Admitting: Registered Nurse

## 2018-08-06 VITALS — BP 151/99 | HR 100 | Temp 98.8°F | Ht 71.0 in | Wt 217.0 lb

## 2018-08-06 DIAGNOSIS — Z79891 Long term (current) use of opiate analgesic: Secondary | ICD-10-CM

## 2018-08-06 DIAGNOSIS — F17210 Nicotine dependence, cigarettes, uncomplicated: Secondary | ICD-10-CM | POA: Diagnosis not present

## 2018-08-06 DIAGNOSIS — G894 Chronic pain syndrome: Secondary | ICD-10-CM

## 2018-08-06 DIAGNOSIS — M961 Postlaminectomy syndrome, not elsewhere classified: Secondary | ICD-10-CM | POA: Insufficient documentation

## 2018-08-06 DIAGNOSIS — G8929 Other chronic pain: Secondary | ICD-10-CM | POA: Insufficient documentation

## 2018-08-06 DIAGNOSIS — Z9889 Other specified postprocedural states: Secondary | ICD-10-CM | POA: Diagnosis not present

## 2018-08-06 DIAGNOSIS — Z5181 Encounter for therapeutic drug level monitoring: Secondary | ICD-10-CM | POA: Diagnosis not present

## 2018-08-06 DIAGNOSIS — Z981 Arthrodesis status: Secondary | ICD-10-CM | POA: Diagnosis not present

## 2018-08-06 DIAGNOSIS — M545 Low back pain: Secondary | ICD-10-CM | POA: Diagnosis not present

## 2018-08-06 DIAGNOSIS — M5416 Radiculopathy, lumbar region: Secondary | ICD-10-CM | POA: Diagnosis not present

## 2018-08-06 DIAGNOSIS — I10 Essential (primary) hypertension: Secondary | ICD-10-CM | POA: Insufficient documentation

## 2018-08-06 DIAGNOSIS — E119 Type 2 diabetes mellitus without complications: Secondary | ICD-10-CM | POA: Insufficient documentation

## 2018-08-06 MED ORDER — OXYCODONE-ACETAMINOPHEN 10-325 MG PO TABS: ORAL_TABLET | ORAL | 0 refills | Status: DC

## 2018-08-06 NOTE — Progress Notes (Signed)
Subjective:    Patient ID: Jeffrey Austin, male    DOB: 10-Jan-1961, 58 y.o.   MRN: [id number]  HPI: Jeffrey Austin is a 58 y.o. male who returns for follow up appointment for chronic pain and medication refill. He states his pain is located in his mid-lower back radiating into his bilateral lower extremities. He rates his pain 7. His current exercise regime is walking.  [name] Morphine equivalent is 44.44 MME.  Last Oral Swab was Performed on 03/11/2018, it was consistent.    Pain Inventory Average Pain 8 Pain Right Now 7 My pain is sharp, stabbing, tingling and aching  In the last 24 hours, has pain interfered with the following? General activity 7 Relation with others 0 Enjoyment of life 7 What TIME of day is your pain at its worst? morning and evening Sleep (in general) Fair  Pain is worse with: bending and sitting Pain improves with: rest, heat/ice, therapy/exercise and medication Relief from Meds: 8  Mobility Do you have any goals in this area?  no  Function Do you have any goals in this area?  no  Neuro/Psych No problems in this area  Prior Studies Any changes since last visit?  no  Physicians involved in your care Any changes since last visit?  no   Family History  Problem Relation Age of Onset  . Leukemia Father    Social History   Socioeconomic History  . Marital status: Married    Spouse name: Not on file  . Number of children: Not on file  . Years of education: Not on file  . Highest education level: Not on file  Occupational History  . Not on file  Social Needs  . Financial resource strain: Not on file  . Food insecurity:    Worry: Not on file    Inability: Not on file  . Transportation needs:    Medical: Not on file    Non-medical: Not on file  Tobacco Use  . Smoking status: Current Some Day Smoker  . Smokeless tobacco: Never Used  Substance and Sexual Activity  . Alcohol use: Yes    Alcohol/week: 0.0 standard drinks   Comment: 3-4 times weekly  . Drug use: No  . Sexual activity: Yes  Lifestyle  . Physical activity:    Days per week: Not on file    Minutes per session: Not on file  . Stress: Not on file  Relationships  . Social connections:    Talks on phone: Not on file    Gets together: Not on file    Attends religious service: Not on file    Active member of club or organization: Not on file    Attends meetings of clubs or organizations: Not on file    Relationship status: Not on file  Other Topics Concern  . Not on file  Social History Narrative  . Not on file   Past Surgical History:  Procedure Laterality Date  . cyst removed from testicle     . INCISIONAL HERNIA REPAIR N/A 02/01/2016   Procedure: LAPAROSCOPIC INCISIONAL HERNIA REPAIR;  Surgeon: Arta Bruce Kinsinger, MD;  [facility];  Service: General;  Laterality: N/A;  . INSERTION OF MESH N/A 02/01/2016   Procedure: INSERTION OF MESH;  Surgeon: Arta Bruce Kinsinger, MD;  [facility];  Service: General;  Laterality: N/A;  . LUMBAR FUSION    . SPINAL FUSION     Past Medical History:  Diagnosis Date  . Back pain   .  Diabetes mellitus without complication ([place])   . Hypertension    BP (!) 151/99   Pulse 100   Temp 98.8 F (37.1 C)   Ht 5\' 11"  (1.803 m)   Wt 217 lb (98.4 kg)   SpO2 98%   BMI 30.27 kg/m   Opioid Risk Score:   Fall Risk Score:  `1  Depression screen PHQ 2/9  Depression screen [facility] 2/9 08/06/2018 12/09/2017 09/02/2017  Decreased Interest 0 0 0  Down, Depressed, Hopeless 0 0 0  PHQ - 2 Score 0 0 0    Review of Systems  Constitutional: Negative.   HENT: Negative.   Eyes: Negative.   Respiratory: Negative.   Cardiovascular: Negative.   Gastrointestinal: Negative.   Endocrine: Negative.   Genitourinary: Negative.   Musculoskeletal: Negative.   Skin: Negative.   Allergic/Immunologic: Negative.   Neurological: Negative.   Hematological: Negative.   Psychiatric/Behavioral: Negative.   All other  systems reviewed and are negative.      Objective:   Physical Exam Vitals signs and nursing note reviewed.  Constitutional:      Appearance: Normal appearance.  Neck:     Musculoskeletal: Normal range of motion and neck supple.  Cardiovascular:     Rate and Rhythm: Normal rate and regular rhythm.     Pulses: Normal pulses.     Heart sounds: Normal heart sounds.  Pulmonary:     Effort: Pulmonary effort is normal.     Breath sounds: Normal breath sounds.  Musculoskeletal:     Comments: Normal Muscle Bulk and Muscle Testing Reveals:  Upper Extremities: Full ROM and Muscle Strength 5/5  Lumbar Hypersensitivity Lower Extremities: Decreased ROM and Muscle Strength 5/5 Bilateral Lower Extremities Flexion Produces pain into Bilateral Lower Extremities. Arises from chair slowly Antalgic Gait   Skin:    General: Skin is warm and dry.  Neurological:     Mental Status: He is alert and oriented to person, place, and time.           Assessment & Plan:  1.Post Laminectomy Syndrome: Continue current medication regimen and HEP as tolerated.08/06/2018. 2. Chronic Lumbar Radiculopathy:S/P Lumbar Radiofrequency with Dr. Ernestina Patches on 12/24/2017, with relief noted.Continue to Monitor.08/06/2018. 3. Chronic pain Syndrome: Refilled: Oxycodone 10/325 mg one tablet every 8 hours as needed for moderate pain.#80. 08/06/2018. 4.Right Greater Trochanteric Bursitis:No complaints today.Continue to Alternate Ice and Heat Therapy.08/06/2018  15 minutes of face to face patient care time was spent during this visit. All questions were encouraged and answered.  F/U in 1 month

## 2018-08-19 ENCOUNTER — Other Ambulatory Visit: Payer: Self-pay | Admitting: Family Medicine

## 2018-08-21 ENCOUNTER — Other Ambulatory Visit: Payer: Self-pay

## 2018-08-21 ENCOUNTER — Encounter: Payer: Self-pay | Admitting: Physical Medicine and Rehabilitation

## 2018-08-21 ENCOUNTER — Ambulatory Visit (INDEPENDENT_AMBULATORY_CARE_PROVIDER_SITE_OTHER): Payer: Medicare HMO | Admitting: Physical Medicine and Rehabilitation

## 2018-08-21 VITALS — BP 139/95 | HR 89 | Ht 70.0 in | Wt 211.0 lb

## 2018-08-21 DIAGNOSIS — M961 Postlaminectomy syndrome, not elsewhere classified: Secondary | ICD-10-CM

## 2018-08-21 DIAGNOSIS — G894 Chronic pain syndrome: Secondary | ICD-10-CM | POA: Diagnosis not present

## 2018-08-21 DIAGNOSIS — M25552 Pain in left hip: Secondary | ICD-10-CM | POA: Diagnosis not present

## 2018-08-21 DIAGNOSIS — G8929 Other chronic pain: Secondary | ICD-10-CM

## 2018-08-21 DIAGNOSIS — M533 Sacrococcygeal disorders, not elsewhere classified: Secondary | ICD-10-CM

## 2018-08-21 NOTE — Progress Notes (Signed)
.  Numeric Pain Rating Scale and Functional Assessment Average Pain 8 Pain Right Now 8 My pain is constant and sharp Pain is worse with: bending and sitting Pain improves with: medication   In the last MONTH (on 0-10 scale) has pain interfered with the following?  1. General activity like being  able to carry out your everyday physical activities such as walking, climbing stairs, carrying groceries, or moving a chair?  Rating(9)  2. Relation with others like being able to carry out your usual social activities and roles such as  activities at home, at work and in your community. Rating(9)  3. Enjoyment of life such that you have  been bothered by emotional problems such as feeling anxious, depressed or irritable?  Rating(5)

## 2018-08-26 ENCOUNTER — Ambulatory Visit (INDEPENDENT_AMBULATORY_CARE_PROVIDER_SITE_OTHER): Payer: Medicare HMO | Admitting: Family Medicine

## 2018-08-26 ENCOUNTER — Other Ambulatory Visit: Payer: Self-pay

## 2018-08-26 ENCOUNTER — Encounter: Payer: Self-pay | Admitting: Family Medicine

## 2018-08-26 VITALS — BP 128/82 | HR 102 | Temp 98.9°F | Ht 70.0 in | Wt 214.4 lb

## 2018-08-26 DIAGNOSIS — Z0000 Encounter for general adult medical examination without abnormal findings: Secondary | ICD-10-CM | POA: Diagnosis not present

## 2018-08-26 DIAGNOSIS — Z1211 Encounter for screening for malignant neoplasm of colon: Secondary | ICD-10-CM

## 2018-08-26 DIAGNOSIS — E119 Type 2 diabetes mellitus without complications: Secondary | ICD-10-CM

## 2018-08-26 LAB — COMPREHENSIVE METABOLIC PANEL
ALT: 48 U/L (ref 0–53)
AST: 41 U/L — ABNORMAL HIGH (ref 0–37)
Albumin: 4.4 g/dL (ref 3.5–5.2)
Alkaline Phosphatase: 69 U/L (ref 39–117)
BUN: 10 mg/dL (ref 6–23)
CO2: 29 mEq/L (ref 19–32)
Calcium: 9.2 mg/dL (ref 8.4–10.5)
Chloride: 97 mEq/L (ref 96–112)
Creatinine, Ser: 0.83 mg/dL (ref 0.40–1.50)
GFR: 95.51 mL/min (ref >60.00)
Glucose, Bld: 143 mg/dL — ABNORMAL HIGH (ref 70–99)
Potassium: 3.4 mEq/L — ABNORMAL LOW (ref 3.5–5.1)
Sodium: 136 mEq/L (ref 135–145)
Total Bilirubin: 0.9 mg/dL (ref 0.2–1.2)
Total Protein: 6.7 g/dL (ref 6.0–8.3)

## 2018-08-26 LAB — CBC
HCT: 46.8 % (ref 39.0–52.0)
Hemoglobin: 16.3 g/dL (ref 13.0–17.0)
MCHC: 34.7 g/dL (ref 30.0–36.0)
MCV: 97.4 fl (ref 78.0–100.0)
Platelets: 133 10*3/uL — ABNORMAL LOW (ref 150.0–400.0)
RBC: 4.8 Mil/uL (ref 4.22–5.81)
RDW: 13.4 % (ref 11.5–15.5)
WBC: 8 10*3/uL (ref 4.0–10.5)

## 2018-08-26 LAB — LIPID PANEL
Cholesterol: 96 mg/dL (ref 0–200)
HDL: 51.5 mg/dL (ref >39.00)
LDL Cholesterol: 23 mg/dL (ref 0–99)
NonHDL: 44.03
Total CHOL/HDL Ratio: 2
Triglycerides: 105 mg/dL (ref 0.0–149.0)
VLDL: 21 mg/dL (ref 0.0–40.0)

## 2018-08-26 LAB — MICROALBUMIN / CREATININE URINE RATIO
Creatinine,U: 28.4 mg/dL
Microalb Creat Ratio: 29.2 mg/g (ref 0.0–30.0)
Microalb, Ur: 8.3 mg/dL — ABNORMAL HIGH (ref 0.0–1.9)

## 2018-08-26 LAB — HEMOGLOBIN A1C: Hgb A1c MFr Bld: 7.1 % — ABNORMAL HIGH (ref 4.6–6.5)

## 2018-08-26 NOTE — Patient Instructions (Signed)
Give us 2-3 business days to get the results of your labs back.   Keep the diet clean and stay active.  If you do not hear anything about your referral in the next 1-2 weeks, call our office and ask for an update.  Let us know if you need anything. 

## 2018-08-26 NOTE — Progress Notes (Signed)
Chief Complaint  Patient presents with  . Annual Exam    Well Male Jeffrey Austin is here for a complete physical.   His last physical was >1 year ago.  Current diet: in general, a "healthy" diet.  Current exercise: walking Weight trend: gained a little Daytime fatigue? No. Seat belt? Yes.    Health maintenance Colonoscopy- No Tetanus- Yes HIV- Yes  Hep C- Yes   Past Medical History:  Diagnosis Date  . Back pain   . Diabetes mellitus without complication ([place])   . Hypertension       Past Surgical History:  Procedure Laterality Date  . cyst removed from testicle     . INCISIONAL HERNIA REPAIR N/A 02/01/2016   Procedure: LAPAROSCOPIC INCISIONAL HERNIA REPAIR;  Surgeon: Arta Bruce Kinsinger, MD;  Location: [facility];  Service: General;  Laterality: N/A;  . INSERTION OF MESH N/A 02/01/2016   Procedure: INSERTION OF MESH;  Surgeon: Arta Bruce Kinsinger, MD;  Location: [facility];  Service: General;  Laterality: N/A;  . LUMBAR FUSION    . SPINAL FUSION      Medications  Current Outpatient Medications on File Prior to Visit  Medication Sig Dispense Refill  . atorvastatin (LIPITOR) 40 MG tablet TAKE 1 TABLET EVERY DAY 90 tablet 3  . glipiZIDE (GLUCOTROL) 5 MG tablet Take 1 tablet (5 mg total) by mouth 2 (two) times daily before a meal. 180 tablet 2  . losartan-hydrochlorothiazide (HYZAAR) 100-25 MG tablet Take 1 tablet by mouth daily. 90 tablet 3  . metFORMIN (GLUCOPHAGE) 1000 MG tablet TAKE 1 TABLET TWICE DAILY WITH MEALS 180 tablet 0  . oxyCODONE-acetaminophen (PERCOCET) 10-325 MG tablet Take 1 tab every 8 hours as needed for moderate-severe pain. 80 tablet 0   Allergies No Known Allergies  Family History Family History  Problem Relation Age of Onset  . Leukemia Father     Review of Systems: Constitutional:  no fevers Eye:  no recent significant change in vision Ear/Nose/Mouth/Throat:  Ears:  no hearing loss Nose/Mouth/Throat:  no complaints of nasal congestion, no  sore throat Cardiovascular:  no chest pain, no palpitations Respiratory:  no cough and no shortness of breath Gastrointestinal:  no abdominal pain, no change in bowel habits GU:  Male: negative for dysuria, frequency, and incontinence and negative for prostate symptoms Musculoskeletal/Extremities: +chronic low back pain; otherwise no pain, redness, or swelling of the joints Integumentary (Skin/Breast):  no abnormal skin lesions reported Neurologic:  no headaches Endocrine: No unexpected weight changes Hematologic/Lymphatic:  no abnormal bleeding  Exam BP 128/82 (BP Location: Left Arm, Patient Position: Sitting, Cuff Size: Normal)   Pulse (!) 102   Temp 98.9 F (37.2 C) (Oral)   Ht 5\' 10"  (1.778 m)   Wt 214 lb 6 oz (97.2 kg)   SpO2 98%   BMI 30.76 kg/m  General:  well developed, well nourished, in no apparent distress Skin:  no significant moles, warts, or growths Head:  no masses, lesions, or tenderness Eyes:  pupils equal and round, sclera anicteric without injection Ears:  canals without lesions, TMs shiny without retraction, no obvious effusion, no erythema Nose:  nares patent, septum midline, mucosa normal Throat/Pharynx:  lips and gingiva without lesion; tongue and uvula midline; non-inflamed pharynx; no exudates or postnasal drainage Neck: neck supple without adenopathy, thyromegaly, or masses Lungs:  clear to auscultation, breath sounds equal bilaterally, no respiratory distress Cardio:  regular rate and rhythm, no LE edema, no bruits Abdomen:  abdomen soft, nontender; bowel sounds normal;  no masses or organomegaly Rectal: Deferred Musculoskeletal:  symmetrical muscle groups noted without atrophy or deformity Extremities:  no clubbing, cyanosis, or edema, no deformities, no skin discoloration Neuro:  gait normal; deep tendon reflexes normal and symmetric Psych: well oriented with normal range of affect and appropriate judgment/insight  Assessment and Plan  Well adult  exam - Plan: CBC, Comprehensive metabolic panel, Lipid panel, Hemoglobin A1c, Microalbumin / creatinine urine ratio  Screen for colon cancer - Plan: Ambulatory referral to Gastroenterology   Well 58 y.o. male. Counseled on diet and exercise. Counseled on risks and benefits of prostate cancer screening with PSA. The patient agrees to forego testing. Refuses PCV23 again.  Immunizations, labs, and further orders as above. Follow up in 6 mo for DM visit. The patient voiced understanding and agreement to the plan.  Johnson, DO 08/26/18 10:02 AM

## 2018-08-27 ENCOUNTER — Other Ambulatory Visit: Payer: Self-pay | Admitting: Family Medicine

## 2018-08-27 DIAGNOSIS — E876 Hypokalemia: Secondary | ICD-10-CM

## 2018-08-30 ENCOUNTER — Other Ambulatory Visit: Payer: Self-pay | Admitting: Family Medicine

## 2018-08-30 DIAGNOSIS — E119 Type 2 diabetes mellitus without complications: Secondary | ICD-10-CM

## 2018-09-02 ENCOUNTER — Other Ambulatory Visit: Payer: Self-pay

## 2018-09-02 ENCOUNTER — Ambulatory Visit (INDEPENDENT_AMBULATORY_CARE_PROVIDER_SITE_OTHER): Payer: Medicare HMO | Admitting: Physical Medicine and Rehabilitation

## 2018-09-02 ENCOUNTER — Ambulatory Visit: Payer: Medicare HMO

## 2018-09-02 ENCOUNTER — Encounter: Payer: Self-pay | Admitting: Physical Medicine and Rehabilitation

## 2018-09-02 ENCOUNTER — Telehealth: Payer: Self-pay | Admitting: *Deleted

## 2018-09-02 DIAGNOSIS — M533 Sacrococcygeal disorders, not elsewhere classified: Secondary | ICD-10-CM

## 2018-09-02 DIAGNOSIS — M461 Sacroiliitis, not elsewhere classified: Secondary | ICD-10-CM

## 2018-09-02 NOTE — Progress Notes (Signed)
   Jeffrey Austin - 58 y.o. male MRN [id number]  Date of birth: [birth year]  Office Visit Note: Visit Date: 09/02/2018 PCP: Shelda Pal, DO Referred by: Shelda Pal*  Subjective: Chief Complaint  Patient presents with  . Lower Back - Pain   HPI:  Jeffrey Austin is a 58 y.o. male who comes in today For planned left diagnostic sacroiliac joint injection under fluoroscopic guidance for left low back pain.  Patient's history is well reviewed in the notes.  He is currently being seen at the [facility] physical medicine and pain [facility] by Dr. Letta Pate.  He has had prior lumbar fusion with continued pain of the sacroiliac joints which is very common after lumbar fusion to the sacrum.  Patient has undergone prior diagnostic injections by Dr. Kittie Plater on the right.  He went on to have radiofrequency ablation of the lateral branches of the right sacroiliac joint with good resolution of symptoms for about 6 months.  Dr. Letta Pate does not do the radiofrequency ablation in his office.  Patient continues to have left-sided complaints that are similar.  He has not had prior diagnostic block.  We will perform this today.  Other notes can be reviewed.  We are looking at getting approval for repeat right radiofrequency ablation of the right sacroiliac joint.  Patient does use opioid medication with only mild pain relief.  He has had no new injuries or trauma.  Brief exam shows he has good distal strength and ambulates without aid.  ROS Otherwise per HPI.  Assessment & Plan: Visit Diagnoses:  1. Sacroiliitis, not elsewhere classified ([place])   2. Sacrococcygeal disorders, not elsewhere classified     Plan: No additional findings.   Meds & Orders: No orders of the defined types were placed in this encounter.   Orders Placed This Encounter  Procedures  . Sacroiliac Joint Inj  . XR C-ARM NO REPORT    Follow-up: Return for Alysia Penna, MD.   Procedures: Sacroiliac Joint Inj on  09/02/2018 8:47 AM Indications: pain and diagnostic evaluation Details: 22 G 3.5 in needle, fluoroscopy-guided posterior approach Medications: 2 mL bupivacaine 0.5 %; 80 mg methylPREDNISolone acetate 80 MG/ML Outcome: tolerated well, no immediate complications  There was excellent flow of contrast producing a partial arthrogram of the sacroiliac joint.  Procedure, treatment alternatives, risks and benefits explained, specific risks discussed. Consent was given by the patient. Immediately prior to procedure a time out was called to verify the correct patient, procedure, equipment, support staff and site/side marked as required. Patient was prepped and draped in the usual sterile fashion.      No notes on file   Clinical History: IMPRESSION: LUMBAR MYELOGRAM IMPRESSION:  Solid L4 through S1 anterior lumbar interbody fusion. No adjacent segment disease is evident. Anatomic alignment.  CT LUMBAR MYELOGRAM IMPRESSION:  Solid appearing L4-5 and L5-S1 anterior lumbar interbody fusion without postoperative complication or residual impingement.  Annular bulge L3-4 with mild facet arthropathy, but no findings concerning for adjacent segment disease.   Electronically Signed   By: Staci Righter M.D.   On: 01/18/2016 10:30     Objective:  VS:  HT:    WT:   BMI:     BP:   HR: bpm  TEMP: ( )  RESP:  Physical Exam  Ortho Exam Imaging: Xr C-arm No Report  Result Date: 09/02/2018 Please see Notes tab for imaging impression.

## 2018-09-02 NOTE — Progress Notes (Signed)
Numeric Pain Rating Scale and Functional Assessment Average Pain 8   In the last MONTH (on 0-10 scale) has pain interfered with the following?  1. General activity like being  able to carry out your everyday physical activities such as walking, climbing stairs, carrying groceries, or moving a chair?  Rating(8)   -BT, -Dye Allergies.

## 2018-09-03 ENCOUNTER — Encounter: Payer: Self-pay | Admitting: Registered Nurse

## 2018-09-03 ENCOUNTER — Encounter: Payer: Self-pay | Admitting: Physical Medicine and Rehabilitation

## 2018-09-03 ENCOUNTER — Encounter: Payer: Medicare HMO | Attending: Physical Medicine & Rehabilitation | Admitting: Registered Nurse

## 2018-09-03 VITALS — BP 152/93 | HR 95 | Temp 97.7°F | Wt 217.0 lb

## 2018-09-03 DIAGNOSIS — Z79891 Long term (current) use of opiate analgesic: Secondary | ICD-10-CM

## 2018-09-03 DIAGNOSIS — G894 Chronic pain syndrome: Secondary | ICD-10-CM

## 2018-09-03 DIAGNOSIS — M961 Postlaminectomy syndrome, not elsewhere classified: Secondary | ICD-10-CM | POA: Insufficient documentation

## 2018-09-03 DIAGNOSIS — Z9889 Other specified postprocedural states: Secondary | ICD-10-CM | POA: Insufficient documentation

## 2018-09-03 DIAGNOSIS — F17210 Nicotine dependence, cigarettes, uncomplicated: Secondary | ICD-10-CM | POA: Diagnosis not present

## 2018-09-03 DIAGNOSIS — I10 Essential (primary) hypertension: Secondary | ICD-10-CM | POA: Diagnosis not present

## 2018-09-03 DIAGNOSIS — M545 Low back pain, unspecified: Secondary | ICD-10-CM

## 2018-09-03 DIAGNOSIS — M5416 Radiculopathy, lumbar region: Secondary | ICD-10-CM

## 2018-09-03 DIAGNOSIS — E119 Type 2 diabetes mellitus without complications: Secondary | ICD-10-CM | POA: Insufficient documentation

## 2018-09-03 DIAGNOSIS — G8929 Other chronic pain: Secondary | ICD-10-CM | POA: Diagnosis not present

## 2018-09-03 DIAGNOSIS — Z5181 Encounter for therapeutic drug level monitoring: Secondary | ICD-10-CM

## 2018-09-03 DIAGNOSIS — Z981 Arthrodesis status: Secondary | ICD-10-CM | POA: Diagnosis not present

## 2018-09-03 MED ORDER — METHYLPREDNISOLONE ACETATE 80 MG/ML IJ SUSP
80.0000 mg | INTRAMUSCULAR | Status: AC | PRN
  Administered 2018-09-02: 80 mg via INTRA_ARTICULAR

## 2018-09-03 MED ORDER — OXYCODONE-ACETAMINOPHEN 10-325 MG PO TABS: ORAL_TABLET | ORAL | 0 refills | Status: DC

## 2018-09-03 MED ORDER — BUPIVACAINE HCL 0.5 % IJ SOLN
2.0000 mL | INTRAMUSCULAR | Status: AC | PRN
  Administered 2018-09-02: 2 mL via INTRA_ARTICULAR

## 2018-09-03 NOTE — Telephone Encounter (Signed)
Done, please try to get Auth for SI joint denervation see me for code

## 2018-09-03 NOTE — Progress Notes (Signed)
Subjective:    Patient ID: Jeffrey Austin, male    DOB: [birth year], 58 y.o.   MRN: [id number]  HPI: Jeffrey Austin is a 58 y.o. male who returns for follow up appointment for chronic pain and medication refill. He states his pain is located in his lower back radiating into his left lower extremity. He rates his  Pain 7. His current exercise regime is walking and performing.   Jeffrey Austin Morphine equivalent is 44.44 MME.  Last Oral Swab was Performed on 03/11/2018, it was consistent.   Pain Inventory Average Pain 8 Pain Right Now 7 My pain is sharp, burning, stabbing, tingling and aching  In the last 24 hours, has pain interfered with the following? General activity 6 Relation with others 3 Enjoyment of life 4 What TIME of day is your pain at its worst? night, morning Sleep (in general) n/a  Pain is worse with: n/a Pain improves with: n/a Relief from Meds: 6  Mobility no  Function no  Neuro/Psych No problems in this area  Prior Studies no  Physicians involved in your care no   Family History  Problem Relation Age of Onset  . Leukemia Father    Social History   Socioeconomic History  . Marital status: Married    Spouse name: Not on file  . Number of children: Not on file  . Years of education: Not on file  . Highest education level: Not on file  Occupational History  . Not on file  Social Needs  . Financial resource strain: Not on file  . Food insecurity    Worry: Not on file    Inability: Not on file  . Transportation needs    Medical: Not on file    Non-medical: Not on file  Tobacco Use  . Smoking status: Current Some Day Smoker    Packs/day: 0.40    Years: 2.00    Pack years: 0.80    Types: Cigarettes  . Smokeless tobacco: Never Used  Substance and Sexual Activity  . Alcohol use: Yes    Alcohol/week: 0.0 standard drinks    Comment: 3-4 times weekly  . Drug use: No  . Sexual activity: Yes  Lifestyle  . Physical activity    Days per  week: Not on file    Minutes per session: Not on file  . Stress: Not on file  Relationships  . Social Herbalist on phone: Not on file    Gets together: Not on file    Attends religious service: Not on file    Active member of club or organization: Not on file    Attends meetings of clubs or organizations: Not on file    Relationship status: Not on file  Other Topics Concern  . Not on file  Social History Narrative  . Not on file   Past Surgical History:  Procedure Laterality Date  . cyst removed from testicle     . INCISIONAL HERNIA REPAIR N/A 02/01/2016   Procedure: LAPAROSCOPIC INCISIONAL HERNIA REPAIR;  Surgeon: Arta Bruce Kinsinger, MD;  Location: [facility];  Service: General;  Laterality: N/A;  . INSERTION OF MESH N/A 02/01/2016   Procedure: INSERTION OF MESH;  Surgeon: Arta Bruce Kinsinger, MD;  Location: [facility];  Service: General;  Laterality: N/A;  . LUMBAR FUSION    . SPINAL FUSION     Past Medical History:  Diagnosis Date  . Back pain   . Diabetes mellitus without complication ([place])   .  Hypertension    There were no vitals taken for this visit.  Opioid Risk Score:   Fall Risk Score:  `1  Depression screen PHQ 2/9  Depression screen [facility] [place] 2/9 08/06/2018 12/09/2017 09/02/2017  Decreased Interest 0 0 0  Down, Depressed, Hopeless 0 0 0  PHQ - 2 Score 0 0 0      Review of Systems  All other systems reviewed and are negative.      Objective:   Physical Exam Vitals signs and nursing note reviewed.  Constitutional:      Appearance: Normal appearance.  Neck:     Musculoskeletal: Normal range of motion and neck supple.  Cardiovascular:     Rate and Rhythm: Normal rate and regular rhythm.     Pulses: Normal pulses.     Heart sounds: Normal heart sounds.  Pulmonary:     Effort: Pulmonary effort is normal.     Breath sounds: Normal breath sounds.  Musculoskeletal:     Comments: Normal Muscle Bulk and Muscle Testing Reveals:  Upper Extremities: Full   ROM and Muscle Strength 5/5  Lumbar Hypersensitivity ( Mainly Left Side)  Lower Extremities: Full ROM and Muscle Strength 5/5 Left Lower Extremity Flexion Produces Pain into Lumbar Mainly Left Side Arises from chair slowly Narrow Based Gait   Skin:    General: Skin is warm and dry.  Neurological:     Mental Status: He is alert and oriented to person, place, and time.  Psychiatric:        Mood and Affect: Mood normal.        Behavior: Behavior normal.           Assessment & Plan:  1.Post Laminectomy Syndrome: Continue current medication regimen and HEP as tolerated.09/03/2018. 2. Chronic Lumbar Radiculopathy:S/P Lumbar Radiofrequency with Dr. Ernestina Patches on 12/24/2017, with relief noted.Continue to Monitor.09/03/2018. 3. Chronic pain Syndrome: Refilled:Decreased Tablets: Oxycodone 10/325 mg one tablet every8hours as needed for moderate pain.#75. 09/03/2018. 4.Right Greater Trochanteric Bursitis:No complaints today.Continue to Alternate Ice and Heat Therapy.09/03/2018 5. Sacroilitis: S/P SI Injection by Dr. Ernestina Patches on 09/02/2018. With relief noted.   15 minutes of face to face patient care time was spent during this visit. All questions were encouraged and answered.  F/U in 1 month

## 2018-09-03 NOTE — Telephone Encounter (Signed)
No pa is needed for [phone] per [place]. Reference# [id number]

## 2018-09-03 NOTE — Telephone Encounter (Signed)
Called pt and lvm #1 to get pt scheduled for RFA for Si joint.

## 2018-09-03 NOTE — Progress Notes (Signed)
Jeffrey Austin - 58 y.o. male MRN [id number]  Date of birth: May 23, 1960  Office Visit Note: Visit Date: 08/21/2018 PCP: Shelda Pal, DO Referred by: Shelda Pal*  Subjective: Chief Complaint  Patient presents with   Lower Back - Pain   Right Leg - Pain   Left Leg - Pain   HPI: Jeffrey Austin is a 58 y.o. male who comes in today For 2 main issues the patient is having chronic low back pain right equal to left really right below the belt line consistent with pain associated with the sacroiliac joint sacroiliitis.  He has had prior lumbar fusion.  He is followed at [facility] by Dr. Alysia Penna.  His care really is directed by Dr. Letta Pate.  His past history can be reviewed in our notes and their notes which are in the chart.  Briefly he is having return of right low back and upper buttock pain and hip pain.  Prior diagnostic injections by Dr. Letta Pate and a double block paradigm were performed of the sacroiliac joint on the right and these were very beneficial to him.  Patient does take chronic opioid therapy.  He reports nothing else is really helped.  He has had multiple other injections.  Because of issues with doing an ablation in their office the patient was sent to me and we did complete right sided radiofrequency ablation of the lateral branches to the sacroiliac joint and the patient actually did get good relief for about 6 months or more.  Again those notes can all be reviewed and were done appropriately in terms of getting approval from his insurance company.  He has had a change of insurance company since we have seen him.  He is having the same symptoms on the right he has had no new injury or trauma no radicular pain down the leg.  No real imaging changes.  Continue to be managed in a chronic pain medicine setting with multi-factorial approaches to his pain relief.  Secondly the patient is having ongoing left-sided  symptoms which are very similar.  He has not had prior diagnostic sacroiliac joint block on this side.  His pain is below the waistline in the buttock and hip region.  It is worse with going from sit in a deep squatting position to standing.  Painful with some external rotation at times.  No groin pain no hip pain from the joint.  No radicular symptoms no paresthesia no weakness.  No new trauma.  Review of Systems  Constitutional: Negative for chills, fever, malaise/fatigue and weight loss.  HENT: Negative for hearing loss and sinus pain.   Eyes: Negative for blurred vision, double vision and photophobia.  Respiratory: Negative for cough and shortness of breath.   Cardiovascular: Negative for chest pain, palpitations and leg swelling.  Gastrointestinal: Negative for abdominal pain, nausea and vomiting.  Genitourinary: Negative for flank pain.  Musculoskeletal: Positive for back pain and joint pain. Negative for myalgias.  Skin: Negative for itching and rash.  Neurological: Negative for tremors, focal weakness and weakness.  Endo/Heme/Allergies: Negative.   Psychiatric/Behavioral: Positive for depression.  All other systems reviewed and are negative.  Otherwise per HPI.  Assessment & Plan: Visit Diagnoses:  1. Chronic pain syndrome   2. Postlaminectomy syndrome, lumbar region   3. Sacrococcygeal disorders, not elsewhere classified   4. Chronic right sacroiliac pain   5. Pain in left hip     Plan: Findings:  1.  Recurring right sacroiliac joint pain and sacroiliitis related to prior lumbar fusion.  This is a well-known entity where there is lumbar fusion performed in the lower spine now does not move correctly and the lowest next joint which in this case is the sacroiliac joint will have increased movement.  Sacroiliac joint as we get older tends to fuse and in these cases it cannot do so and has increased movement and increased degenerative changes and pain.  He had prior double diagnostic  blocks by Dr. Alysia Penna and those can be reviewed.  He went on to have lateral branch neurotomy radiofrequency ablation and did well with that as well.  He got more than 50% relief for 6 months.  We are kindly asking his insurance company to let us repeat that procedure for him as a similar thing is really helped.  He is on chronic opioid medication.  He really is not doing well in terms of his pain control overall and is really getting somewhat depressed about having the ongoing pain.  There is a new CPT code for denervation of the joint and will see how that works in terms of getting this approved.  His exam is consistent with bilateral sacroiliac joint dysfunction and pain with positive Isai sign bilaterally.  He also has a positive Fortin finger sign bilaterally.  2.  In terms of his left-sided pain I do feel like it is very similar and related to the sacroiliac joint to the fusion as described above.  We will have him come back for a left-sided intra-articular facet joint block diagnostically.  Again there is new CPT codes for this am not sure how [place] work out in terms of injection versus lateral branch blocks versus ablation.  I would kindly ask any reviewer looking at the notes though to see if this does help the patient he has had lumbar fusion he has had ongoing opioid therapy and it does seem to help him.    Meds & Orders: No orders of the defined types were placed in this encounter.  No orders of the defined types were placed in this encounter.   Follow-up: Return for authorization for repeat right sacroiliac joint RFA and diagnostic left sacroiliac joint injection..   Procedures: No procedures performed  No notes on file   Clinical History: IMPRESSION: LUMBAR MYELOGRAM IMPRESSION:  Solid L4 through S1 anterior lumbar interbody fusion. No adjacent segment disease is evident. Anatomic alignment.  CT LUMBAR MYELOGRAM IMPRESSION:  Solid appearing L4-5 and L5-S1 anterior  lumbar interbody fusion without postoperative complication or residual impingement.  Annular bulge L3-4 with mild facet arthropathy, but no findings concerning for adjacent segment disease.   Electronically Signed   By: Staci Righter M.D.   On: 01/18/2016 10:30   He reports that he has been smoking cigarettes. He has a 0.80 pack-year smoking history. He has never used smokeless tobacco.  Recent Labs    12/20/17 1020 08/26/18 1017  HGBA1C 6.6* 7.1*    Objective:  VS:  HT:5\' 10"  (177.8 cm)    WT:211 lb (95.7 kg)   BMI:30.28     BP:(!) 139/95   HR:89bpm   TEMP: ( )   RESP:  Physical Exam Vitals signs and nursing note reviewed.  Constitutional:      General: He is not in acute distress.    Appearance: He is well-developed.  HENT:     Head: Normocephalic and atraumatic.     Nose: Nose normal.  Mouth/Throat:     Mouth: Mucous membranes are moist.     Pharynx: Oropharynx is clear.  Eyes:     Conjunctiva/sclera: Conjunctivae normal.     Pupils: Pupils are equal, round, and reactive to light.  Neck:     Musculoskeletal: Normal range of motion and neck supple.     Trachea: No tracheal deviation.  Cardiovascular:     Rate and Rhythm: Normal rate and regular rhythm.     Pulses: Normal pulses.  Pulmonary:     Effort: Pulmonary effort is normal.     Breath sounds: Normal breath sounds.  Abdominal:     General: There is no distension.     Palpations: Abdomen is soft.     Tenderness: There is no guarding or rebound.  Musculoskeletal:        General: No deformity.     Right lower leg: No edema.     Left lower leg: No edema.     Comments: Patient has difficulty going from sit to full standing in extension with pain across the lower back.  He has a positive Yesenia sign bilaterally positive Fortin finger sign bilaterally.  He has some tenderness across all areas of the lower back and greater trochanters but nothing exquisite or reproducible of his normal pain.  Has negative  slump test bilaterally and good distal strength.  Skin:    General: Skin is warm and dry.     Findings: No erythema or rash.  Neurological:     General: No focal deficit present.     Mental Status: He is alert and oriented to person, place, and time.     Motor: No abnormal muscle tone.     Coordination: Coordination normal.     Gait: Gait normal.  Psychiatric:        Mood and Affect: Mood normal.        Behavior: Behavior normal.        Thought Content: Thought content normal.     Ortho Exam Imaging: Xr C-arm No Report  Result Date: 09/02/2018 Please see Notes tab for imaging impression.   Past Medical/Family/Surgical/Social History: Medications & Allergies reviewed per EMR, new medications updated. Patient Active Problem List   Diagnosis Date Noted   Chronic low back pain with bilateral sciatica 04/22/2017   Postlaminectomy syndrome, lumbar region 04/22/2017   Essential hypertension 03/10/2017   Tobacco abuse 03/10/2017   Type 2 diabetes mellitus without complication, without long-term current use of insulin ([place]) 03/10/2017   Past Medical History:  Diagnosis Date   Back pain    Diabetes mellitus without complication ([place])    Hypertension    Family History  Problem Relation Age of Onset   Leukemia Father    Past Surgical History:  Procedure Laterality Date   cyst removed from testicle      INCISIONAL HERNIA REPAIR N/A 02/01/2016   Procedure: [place];  Surgeon: Mickeal Skinner, MD;  Location: [facility];  Service: General;  Laterality: N/A;   INSERTION OF MESH N/A 02/01/2016   Procedure: INSERTION OF MESH;  Surgeon: Arta Bruce Kinsinger, MD;  Location: [facility];  Service: General;  Laterality: N/A;   LUMBAR FUSION     SPINAL FUSION     Social History   Occupational History   Not on file  Tobacco Use   Smoking status: Current Some Day Smoker    Packs/day: 0.40    Years: 2.00    Pack years: 0.80    Types:  Cigarettes   Smokeless tobacco: Never Used  Substance and Sexual Activity   Alcohol use: Yes    Alcohol/week: 0.0 standard drinks    Comment: 3-4 times weekly   Drug use: No   Sexual activity: Yes

## 2018-09-23 NOTE — Telephone Encounter (Signed)
Called pt and lvm #2 to schedule pt for RFA.

## 2018-10-07 ENCOUNTER — Other Ambulatory Visit: Payer: Self-pay

## 2018-10-07 ENCOUNTER — Ambulatory Visit: Payer: Medicare HMO | Admitting: *Deleted

## 2018-10-07 VITALS — Ht 71.0 in | Wt 208.0 lb

## 2018-10-07 DIAGNOSIS — Z1211 Encounter for screening for malignant neoplasm of colon: Secondary | ICD-10-CM

## 2018-10-07 MED ORDER — NA SULFATE-K SULFATE-MG SULF 17.5-3.13-1.6 GM/177ML PO SOLN: ORAL | 0 refills | Status: DC

## 2018-10-07 NOTE — Progress Notes (Signed)
Patient's pre-visit was done today over the phone with the patient due to COVID-19 pandemic. Name,DOB and address verified. Insurance verified. Packet of Prep instructions mailed to patient including copy of a consent form and pre-procedure patient acknowledgement form-pt is aware. Patient understands to call us back with any questions or concerns.  Patient denies any allergies to eggs or soy. Patient denies any problems with anesthesia/sedation. Patient denies any oxygen use at home. Patient denies taking any diet/weight loss medications or blood thinners. EMMI education assisgned to patient on colonoscopy, this was explained and instructions given to patient. Pt is aware that care partner will wait in the car during procedure; if they feel like they will be too hot to wait in the car; they may wait in the lobby.  We want them to wear a mask (we do not have any that we can provide them), practice social distancing, and we will check their temperatures when they get here.  I did remind patient that their care partner needs to stay in the parking lot the entire time. Pt will wear mask into building.

## 2018-10-09 ENCOUNTER — Encounter: Payer: Medicare HMO | Attending: Physical Medicine & Rehabilitation | Admitting: Registered Nurse

## 2018-10-09 ENCOUNTER — Other Ambulatory Visit: Payer: Self-pay

## 2018-10-09 ENCOUNTER — Encounter: Payer: Self-pay | Admitting: Registered Nurse

## 2018-10-09 VITALS — BP 148/95 | HR 92 | Temp 97.5°F | Resp 16 | Ht 71.0 in | Wt 211.8 lb

## 2018-10-09 DIAGNOSIS — M545 Low back pain, unspecified: Secondary | ICD-10-CM

## 2018-10-09 DIAGNOSIS — Z9889 Other specified postprocedural states: Secondary | ICD-10-CM | POA: Diagnosis not present

## 2018-10-09 DIAGNOSIS — E119 Type 2 diabetes mellitus without complications: Secondary | ICD-10-CM | POA: Diagnosis not present

## 2018-10-09 DIAGNOSIS — Z5181 Encounter for therapeutic drug level monitoring: Secondary | ICD-10-CM | POA: Diagnosis not present

## 2018-10-09 DIAGNOSIS — Z981 Arthrodesis status: Secondary | ICD-10-CM | POA: Insufficient documentation

## 2018-10-09 DIAGNOSIS — M5416 Radiculopathy, lumbar region: Secondary | ICD-10-CM | POA: Diagnosis not present

## 2018-10-09 DIAGNOSIS — G894 Chronic pain syndrome: Secondary | ICD-10-CM | POA: Diagnosis not present

## 2018-10-09 DIAGNOSIS — M961 Postlaminectomy syndrome, not elsewhere classified: Secondary | ICD-10-CM | POA: Diagnosis not present

## 2018-10-09 DIAGNOSIS — G8929 Other chronic pain: Secondary | ICD-10-CM | POA: Diagnosis not present

## 2018-10-09 DIAGNOSIS — F17210 Nicotine dependence, cigarettes, uncomplicated: Secondary | ICD-10-CM | POA: Diagnosis not present

## 2018-10-09 DIAGNOSIS — I10 Essential (primary) hypertension: Secondary | ICD-10-CM | POA: Diagnosis not present

## 2018-10-09 DIAGNOSIS — Z79891 Long term (current) use of opiate analgesic: Secondary | ICD-10-CM

## 2018-10-09 MED ORDER — OXYCODONE-ACETAMINOPHEN 10-325 MG PO TABS: ORAL_TABLET | ORAL | 0 refills | Status: DC

## 2018-10-09 NOTE — Progress Notes (Signed)
Subjective:    Patient ID: Jeffrey Austin, male    DOB: 25-Apr-1960, 58 y.o.   MRN: [id number]  HPI: Jeffrey Austin is a 58 y.o. male who returns for follow up appointment for chronic pain and medication refill. He states his  pain is located in his lower back radiating into his left hip and bilateral lower extremities. He rates his pain 7. His current exercise regime is walking.  Jeffrey Austin states he is awaiting on insurance approval for Radiofrequency, Dr. Ernestina Patches following.   Jeffrey Austin Morphine equivalent is 45.00 MME.  Last Oral Swab was Performed on 03/11/2018, it was consistent.   Pain Inventory Average Pain 7 Pain Right Now 7 My pain is sharp, stabbing, tingling and aching  In the last 24 hours, has pain interfered with the following? General activity 9 Relation with others 4 Enjoyment of life 5 What TIME of day is your pain at its worst? all Sleep (in general) Fair  Pain is worse with: na Pain improves with: na Relief from Meds: 5  Mobility walk without assistance  Function disabled: date disabled 2016  Neuro/Psych No problems in this area  Prior Studies Any changes since last visit?  no  Physicians involved in your care Any changes since last visit?  no   Family History  Problem Relation Age of Onset  . Leukemia Father   . Colon cancer Neg Hx   . Esophageal cancer Neg Hx   . Rectal cancer Neg Hx   . Stomach cancer Neg Hx    Social History   Socioeconomic History  . Marital status: Married    Spouse name: Not on file  . Number of children: Not on file  . Years of education: Not on file  . Highest education level: Not on file  Occupational History  . Not on file  Social Needs  . Financial resource strain: Not on file  . Food insecurity    Worry: Not on file    Inability: Not on file  . Transportation needs    Medical: Not on file    Non-medical: Not on file  Tobacco Use  . Smoking status: Current Some Day Smoker    Packs/day: 0.25     Types: Cigarettes  . Smokeless tobacco: Never Used  Substance and Sexual Activity  . Alcohol use: Yes    Alcohol/week: 5.0 - 10.0 standard drinks    Types: 5 - 10 Standard drinks or equivalent per week  . Drug use: No  . Sexual activity: Yes  Lifestyle  . Physical activity    Days per week: Not on file    Minutes per session: Not on file  . Stress: Not on file  Relationships  . Social Herbalist on phone: Not on file    Gets together: Not on file    Attends religious service: Not on file    Active member of club or organization: Not on file    Attends meetings of clubs or organizations: Not on file    Relationship status: Not on file  Other Topics Concern  . Not on file  Social History Narrative  . Not on file   Past Surgical History:  Procedure Laterality Date  . cyst removed from testicle     . INCISIONAL HERNIA REPAIR N/A 02/01/2016   Procedure: LAPAROSCOPIC INCISIONAL HERNIA REPAIR;  Surgeon: Arta Bruce Kinsinger, MD;  Location: [facility];  Service: General;  Laterality: N/A;  . INSERTION OF MESH N/A 02/01/2016  Procedure: INSERTION OF MESH;  Surgeon: Arta Bruce Kinsinger, MD;  Location: [facility];  Service: General;  Laterality: N/A;  . LUMBAR FUSION    . SPINAL FUSION     Past Medical History:  Diagnosis Date  . Back pain   . Diabetes mellitus without complication ([place])   . Hyperlipidemia   . Hypertension    There were no vitals taken for this visit.  Opioid Risk Score:   Fall Risk Score:  `1  Depression screen PHQ 2/9  Depression screen [facility] 2/9 08/06/2018 12/09/2017 09/02/2017  Decreased Interest 0 0 0  Down, Depressed, Hopeless 0 0 0  PHQ - 2 Score 0 0 0     Review of Systems  Constitutional: Negative.   HENT: Negative.   Eyes: Negative.   Respiratory: Negative.   Cardiovascular: Negative.   Gastrointestinal: Negative.   Endocrine: Negative.   Genitourinary: Negative.   Musculoskeletal: Negative.   Skin: Negative.   Allergic/Immunologic:  Negative.   Neurological: Negative.   Hematological: Negative.   Psychiatric/Behavioral: Negative.   All other systems reviewed and are negative.      Objective:   Physical Exam Vitals signs and nursing note reviewed.  Constitutional:      Appearance: Normal appearance.  Neck:     Musculoskeletal: Normal range of motion and neck supple.  Cardiovascular:     Rate and Rhythm: Normal rate and regular rhythm.     Pulses: Normal pulses.     Heart sounds: Normal heart sounds.  Pulmonary:     Effort: Pulmonary effort is normal.     Breath sounds: Normal breath sounds.  Musculoskeletal:     Comments: Normal Muscle Bulk and Muscle Testing Reveals:  Upper Extremities: Full ROM and Muscle Strength 5/5  Lumbar Hypersensitivity Left Greater Trochanter Tenderness Lower Extremities: Full ROM and Muscle Strength 5/5 Arises from chair with ease Narrow Based Gait   Skin:    General: Skin is warm and dry.  Neurological:     Mental Status: He is alert and oriented to person, place, and time.  Psychiatric:        Mood and Affect: Mood normal.        Behavior: Behavior normal.           Assessment & Plan:  1.Post Laminectomy Syndrome: Continue current medication regimen and HEP as tolerated.10/09/2018. 2. Chronic Lumbar Radiculopathy:S/P Lumbar Radiofrequency with Dr. Ernestina Patches on 12/24/2017, with relief noted.Continue to Monitor.10/09/2018. 3. Chronic pain Syndrome: Refilled: Oxycodone 10/325 mg one tablet every8hours as needed for moderate pain.#75. 10/09/2018. 4.Right Greater Trochanteric Bursitis:Continue to Alternate Ice and Heat Therapy.10/09/2018 5. Sacroilitis: S/P SI Injection by Dr. Ernestina Patches on 09/02/2018. With relief noted. 10/09/2018.  75minutes of face to face patient care time was spent during this visit. All questions were encouraged and answered.  F/U in 1 month

## 2018-10-10 NOTE — Telephone Encounter (Signed)
Called lvm #3 to get pt scheduled for RFA

## 2018-10-15 ENCOUNTER — Encounter: Payer: Self-pay | Admitting: Gastroenterology

## 2018-10-15 ENCOUNTER — Telehealth: Payer: Self-pay | Admitting: *Deleted

## 2018-10-15 NOTE — Telephone Encounter (Signed)
Called pt and lvm

## 2018-10-20 ENCOUNTER — Telehealth: Payer: Self-pay | Admitting: Gastroenterology

## 2018-10-20 NOTE — Telephone Encounter (Signed)

## 2018-10-21 ENCOUNTER — Ambulatory Visit (AMBULATORY_SURGERY_CENTER): Payer: Medicare HMO | Admitting: Gastroenterology

## 2018-10-21 ENCOUNTER — Encounter: Payer: Self-pay | Admitting: Gastroenterology

## 2018-10-21 ENCOUNTER — Other Ambulatory Visit: Payer: Self-pay

## 2018-10-21 VITALS — BP 159/104 | HR 76 | Temp 98.8°F | Resp 14

## 2018-10-21 DIAGNOSIS — E119 Type 2 diabetes mellitus without complications: Secondary | ICD-10-CM | POA: Diagnosis not present

## 2018-10-21 DIAGNOSIS — D125 Benign neoplasm of sigmoid colon: Secondary | ICD-10-CM

## 2018-10-21 DIAGNOSIS — I10 Essential (primary) hypertension: Secondary | ICD-10-CM | POA: Diagnosis not present

## 2018-10-21 DIAGNOSIS — Z1211 Encounter for screening for malignant neoplasm of colon: Secondary | ICD-10-CM

## 2018-10-21 DIAGNOSIS — D123 Benign neoplasm of transverse colon: Secondary | ICD-10-CM | POA: Diagnosis not present

## 2018-10-21 DIAGNOSIS — K5730 Diverticulosis of large intestine without perforation or abscess without bleeding: Secondary | ICD-10-CM

## 2018-10-21 MED ORDER — SODIUM CHLORIDE 0.9 % IV SOLN: 500.0000 mL | Freq: Once | INTRAVENOUS | Status: DC

## 2018-10-21 NOTE — Progress Notes (Signed)
Called to room to assist during endoscopic procedure.  Patient ID and intended procedure confirmed with present staff. Received instructions for my participation in the procedure from the performing physician.  

## 2018-10-21 NOTE — Patient Instructions (Signed)
YOU HAD AN ENDOSCOPIC PROCEDURE TODAY AT [facility]:   Refer to the procedure report that was given to you for any specific questions about what was found during the examination.  If the procedure report does not answer your questions, please call your gastroenterologist to clarify.  If you requested that your care partner not be given the details of your procedure findings, then the procedure report has been included in a sealed envelope for you to review at your convenience later.  **Handouts on polyps and diverticulosis**  YOU SHOULD EXPECT: Some feelings of bloating in the abdomen. Passage of more gas than usual.  Walking can help get rid of the air that was put into your GI tract during the procedure and reduce the bloating. If you had a lower endoscopy (such as a colonoscopy or flexible sigmoidoscopy) you may notice spotting of blood in your stool or on the toilet paper. If you underwent a bowel prep for your procedure, you may not have a normal bowel movement for a few days.  Please Note:  You might notice some irritation and congestion in your nose or some drainage.  This is from the oxygen used during your procedure.  There is no need for concern and it should clear up in a day or so.  SYMPTOMS TO REPORT IMMEDIATELY:   Following lower endoscopy (colonoscopy or flexible sigmoidoscopy):  Excessive amounts of blood in the stool  Significant tenderness or worsening of abdominal pains  Swelling of the abdomen that is new, acute  Fever of 100F or higher   For urgent or emergent issues, a gastroenterologist can be reached at any hour by calling [phone].   DIET:  We do recommend a small meal at first, but then you may proceed to your regular diet.  Drink plenty of fluids but you should avoid alcoholic beverages for 24 hours.  ACTIVITY:  You should plan to take it easy for the rest of today and you should NOT DRIVE or use heavy machinery until tomorrow (because of the  sedation medicines used during the test).    FOLLOW UP: Our staff will call the number listed on your records 48-72 hours following your procedure to check on you and address any questions or concerns that you may have regarding the information given to you following your procedure. If we do not reach you, we will leave a message.  We will attempt to reach you two times.  During this call, we will ask if you have developed any symptoms of COVID 19. If you develop any symptoms (ie: fever, flu-like symptoms, shortness of breath, cough etc.) before then, please call [phone].  If you test positive for Covid 19 in the 2 weeks post procedure, please call and report this information to [place].    If any biopsies were taken you will be contacted by phone or by letter within the next 1-3 weeks.  Please call us at [phone] if you have not heard about the biopsies in 3 weeks.    SIGNATURES/CONFIDENTIALITY: You and/or your care partner have signed paperwork which will be entered into your electronic medical record.  These signatures attest to the fact that that the information above on your After Visit Summary has been reviewed and is understood.  Full responsibility of the confidentiality of this discharge information lies with you and/or your care-partner.

## 2018-10-21 NOTE — Op Note (Signed)
[place] Patient Name: Jeffrey Austin Procedure Date: 10/21/2018 10:08 AM MRN: [id number] Endoscopist: Gerrit Heck , MD Age: 58 Referring MD:  Date of Birth: Jul 05, 1960 Gender: Male Account #: [id number] Procedure:                Colonoscopy Indications:              Screening for colorectal malignant neoplasm, This                            is the patient's first colonoscopy Medicines:                Monitored Anesthesia Care Procedure:                Pre-Anesthesia Assessment:                           - Prior to the procedure, a History and Physical                            was performed, and patient medications and                            allergies were reviewed. The patient's tolerance of                            previous anesthesia was also reviewed. The risks                            and benefits of the procedure and the sedation                            options and risks were discussed with the patient.                            All questions were answered, and informed consent                            was obtained. Prior Anticoagulants: The patient has                            taken no previous anticoagulant or antiplatelet                            agents. ASA Grade Assessment: II - A patient with                            mild systemic disease. After reviewing the risks                            and benefits, the patient was deemed in                            satisfactory condition to undergo the procedure.  After obtaining informed consent, the colonoscope                            was passed under direct vision. Throughout the                            procedure, the patient's blood pressure, pulse, and                            oxygen saturations were monitored continuously. The                            Colonoscope was introduced through the anus and                            advanced to the the  cecum, identified by                            appendiceal orifice and ileocecal valve. The                            colonoscopy was performed without difficulty. The                            patient tolerated the procedure well. The quality                            of the bowel preparation was adequate. The                            ileocecal valve, appendiceal orifice, and rectum                            were photographed. Scope In: 10:10:23 AM Scope Out: 10:27:24 AM Scope Withdrawal Time: 0 hours 14 minutes 30 seconds  Total Procedure Duration: 0 hours 17 minutes 1 second  Findings:                 The perianal and digital rectal examinations were                            normal.                           Two sessile polyps were found in the sigmoid colon                            and transverse colon. The polyps were 4 to 6 mm in                            size. These polyps were removed with a cold snare.                            Resection and retrieval were complete. Estimated  blood loss was minimal.                           Two small angioectasias without bleeding were found                            in the cecum and ascending colon.                           A few small-mouthed diverticula were found in the                            transverse colon, ascending colon and cecum.                           The retroflexed view of the distal rectum and anal                            verge was normal and showed no anal or rectal                            abnormalities. Complications:            No immediate complications. Estimated Blood Loss:     Estimated blood loss was minimal. Impression:               - Two 4 to 6 mm polyps in the sigmoid colon and in                            the transverse colon, removed with a cold snare.                            Resected and retrieved.                           - Two non-bleeding colonic  angioectasias.                           - Diverticulosis in the transverse colon, in the                            ascending colon and in the cecum.                           - The distal rectum and anal verge are normal on                            retroflexion view. Recommendation:           - Patient has a contact number available for                            emergencies. The signs and symptoms of potential  delayed complications were discussed with the                            patient. Return to normal activities tomorrow.                            Written discharge instructions were provided to the                            patient.                           - Resume previous diet today.                           - Continue present medications.                           - Await pathology results.                           - Repeat colonoscopy in 7-10 years for surveillance                            based on pathology results.                           - Return to GI clinic PRN. Gerrit Heck, MD 10/21/2018 10:36:33 AM

## 2018-10-21 NOTE — Progress Notes (Signed)
Pt's states no medical or surgical changes since previsit or office visit. 

## 2018-10-23 ENCOUNTER — Telehealth: Payer: Self-pay

## 2018-10-23 NOTE — Telephone Encounter (Signed)
Called [phone] and left a messaged we tried to reach pt for a follow up call. maw

## 2018-10-27 ENCOUNTER — Encounter: Payer: Self-pay | Admitting: Gastroenterology

## 2018-11-19 ENCOUNTER — Other Ambulatory Visit: Payer: Self-pay

## 2018-11-19 ENCOUNTER — Encounter: Payer: Medicare HMO | Attending: Physical Medicine & Rehabilitation | Admitting: Registered Nurse

## 2018-11-19 ENCOUNTER — Encounter: Payer: Self-pay | Admitting: Registered Nurse

## 2018-11-19 VITALS — BP 151/94 | HR 92 | Temp 98.5°F | Ht 71.0 in | Wt 213.2 lb

## 2018-11-19 DIAGNOSIS — Z981 Arthrodesis status: Secondary | ICD-10-CM | POA: Diagnosis not present

## 2018-11-19 DIAGNOSIS — M545 Low back pain: Secondary | ICD-10-CM | POA: Insufficient documentation

## 2018-11-19 DIAGNOSIS — M5416 Radiculopathy, lumbar region: Secondary | ICD-10-CM | POA: Diagnosis not present

## 2018-11-19 DIAGNOSIS — Z9889 Other specified postprocedural states: Secondary | ICD-10-CM | POA: Diagnosis not present

## 2018-11-19 DIAGNOSIS — Z5181 Encounter for therapeutic drug level monitoring: Secondary | ICD-10-CM

## 2018-11-19 DIAGNOSIS — M961 Postlaminectomy syndrome, not elsewhere classified: Secondary | ICD-10-CM | POA: Insufficient documentation

## 2018-11-19 DIAGNOSIS — G8929 Other chronic pain: Secondary | ICD-10-CM | POA: Diagnosis not present

## 2018-11-19 DIAGNOSIS — G894 Chronic pain syndrome: Secondary | ICD-10-CM | POA: Diagnosis not present

## 2018-11-19 DIAGNOSIS — Z79891 Long term (current) use of opiate analgesic: Secondary | ICD-10-CM | POA: Diagnosis not present

## 2018-11-19 DIAGNOSIS — F17210 Nicotine dependence, cigarettes, uncomplicated: Secondary | ICD-10-CM | POA: Insufficient documentation

## 2018-11-19 DIAGNOSIS — I10 Essential (primary) hypertension: Secondary | ICD-10-CM | POA: Insufficient documentation

## 2018-11-19 DIAGNOSIS — E119 Type 2 diabetes mellitus without complications: Secondary | ICD-10-CM | POA: Diagnosis not present

## 2018-11-19 NOTE — Progress Notes (Signed)
Subjective:    Patient ID: Jeffrey Austin, male    DOB: 13-Aug-1960, 58 y.o.   MRN: [id number]  HPI: Jeffrey Austin is a 58 y.o. male who returns for follow up appointment for chronic pain and medication refill. He states his pain is located in his lower back radiating into his bilateral lower extremities L>R. He rates his  Pain8. His current exercise regime is walking.  [name] equivalent is 45.00 MME.  Last Oral Swab was Performed on 03/11/2018, it was consistent.   Pain Inventory Average Pain 8 Pain Right Now 8 My pain is sharp, burning, stabbing and tingling  In the last 24 hours, has pain interfered with the following? General activity 4 Relation with others 7 Enjoyment of life 4 What TIME of day is your pain at its worst? morning,daytime and evening  Sleep (in general) Fair  Pain is worse with: bending and sitting Pain improves with: heat/ice and medication Relief from Meds: 4  Mobility Do you have any goals in this area?  no  Function Do you have any goals in this area?  no  Neuro/Psych No problems in this area  Prior Studies Any changes since last visit?  no  Physicians involved in your care Any changes since last visit?  no   Family History  Problem Relation Age of Onset  . Leukemia Father   . Colon cancer Neg Hx   . Esophageal cancer Neg Hx   . Rectal cancer Neg Hx   . Stomach cancer Neg Hx    Social History   Socioeconomic History  . Marital status: Married    Spouse name: Not on file  . Number of children: Not on file  . Years of education: Not on file  . Highest education level: Not on file  Occupational History  . Not on file  Social Needs  . Financial resource strain: Not on file  . Food insecurity    Worry: Not on file    Inability: Not on file  . Transportation needs    Medical: Not on file    Non-medical: Not on file  Tobacco Use  . Smoking status: Current Some Day Smoker    Packs/day: 0.25    Types: Cigarettes   . Smokeless tobacco: Never Used  Substance and Sexual Activity  . Alcohol use: Yes    Alcohol/week: 5.0 - 10.0 standard drinks    Types: 5 - 10 Standard drinks or equivalent per week  . Drug use: No  . Sexual activity: Yes  Lifestyle  . Physical activity    Days per week: Not on file    Minutes per session: Not on file  . Stress: Not on file  Relationships  . Social Herbalist on phone: Not on file    Gets together: Not on file    Attends religious service: Not on file    Active member of club or organization: Not on file    Attends meetings of clubs or organizations: Not on file    Relationship status: Not on file  Other Topics Concern  . Not on file  Social History Narrative  . Not on file   Past Surgical History:  Procedure Laterality Date  . cyst removed from testicle     . INCISIONAL HERNIA REPAIR N/A 02/01/2016   Procedure: LAPAROSCOPIC INCISIONAL HERNIA REPAIR;  Surgeon: Arta Bruce Kinsinger, MD;  Location: [facility];  Service: General;  Laterality: N/A;  . INSERTION OF MESH N/A  02/01/2016   Procedure: INSERTION OF MESH;  Surgeon: Arta Bruce Kinsinger, MD;  Location: [facility];  Service: General;  Laterality: N/A;  . LUMBAR FUSION    . SPINAL FUSION     Past Medical History:  Diagnosis Date  . Back pain   . Diabetes mellitus without complication ([place])   . Hyperlipidemia   . Hypertension    BP (!) 151/94   Pulse 92   Temp 98.5 F (36.9 C)   Ht 5\' 11"  (1.803 m)   Wt 213 lb 3.2 oz (96.7 kg)   PF 98 L/min   BMI 29.74 kg/m   Opioid Risk Score:   Fall Risk Score:  `1  Depression screen PHQ 2/9  Depression screen [facility] 2/9 08/06/2018 12/09/2017 09/02/2017  Decreased Interest 0 0 0  Down, Depressed, Hopeless 0 0 0  PHQ - 2 Score 0 0 0    Review of Systems  Constitutional: Negative.   HENT: Negative.   Eyes: Negative.   Respiratory: Negative.   Cardiovascular: Negative.   Gastrointestinal: Negative.   Endocrine: Negative.   Genitourinary: Negative.    Musculoskeletal: Negative.   Skin: Negative.   Allergic/Immunologic: Negative.   Neurological: Negative.   Hematological: Negative.   Psychiatric/Behavioral: Negative.   All other systems reviewed and are negative.      Objective:   Physical Exam Vitals signs and nursing note reviewed.  Neck:     Musculoskeletal: Normal range of motion and neck supple.  Cardiovascular:     Rate and Rhythm: Normal rate and regular rhythm.     Pulses: Normal pulses.     Heart sounds: Normal heart sounds.  Pulmonary:     Effort: Pulmonary effort is normal.     Breath sounds: Normal breath sounds.  Musculoskeletal:     Comments: Normal Muscle Bulk and Muscle Testing Reveals:  Upper Extremities: Full ROM and Muscle Strength 5/5  Lumbar Hypersensitivity:  Lower Extremities: Full ROM and Muscle Strength 5/5  Left Lower Extremity Flexion Produces Pain into Left Lower Extremity Arises from chair slowly  Antalgic Gait   Skin:    General: Skin is warm and dry.  Neurological:     Mental Status: He is oriented to person, place, and time.  Psychiatric:        Mood and Affect: Mood normal.        Behavior: Behavior normal.           Assessment & Plan:  1.Post Laminectomy Syndrome: Continue current medication regimen and HEP as tolerated.11/19/2018. 2. Chronic Lumbar Radiculopathy:S/P Lumbar Radiofrequency with Dr. Ernestina Patches on 12/24/2017, with relief noted. Awaiting on insurance approval for Lumbar Radiofrequency he reports. Continue to Monitor.11/19/2018. 3. Chronic pain Syndrome: Refilled:Oxycodone 10/325 mg one tablet every8hours as needed for moderate pain.#75. 11/19/2018. 4.Right Greater Trochanteric Bursitis: No complaints today.Continue to Alternate Ice and Heat Therapy.Continue to Monitor. 0/16/2020 5. Sacroilitis: S/P SI Injection by Dr. Ernestina Patches on 09/02/2018. With relief noted.11/19/2018.  18minutes of face to face patient care time was spent during this visit. All questions  were encouraged and answered.  F/U in 1 month

## 2018-11-24 ENCOUNTER — Telehealth: Payer: Self-pay

## 2018-11-24 DIAGNOSIS — G8929 Other chronic pain: Secondary | ICD-10-CM

## 2018-11-24 MED ORDER — OXYCODONE-ACETAMINOPHEN 10-325 MG PO TABS: ORAL_TABLET | ORAL | 0 refills | Status: DC

## 2018-11-24 NOTE — Telephone Encounter (Signed)
Patient called requesting a refill of pain medication.  According to PMP website last refill and dispensing was:  Fill Date ID Written  Drug     Qty Days Prescriber  10/13/2018 1 10/09/2018 Oxycodone-Acetaminophen 10-325   75.00 25 Eu Tho

## 2018-11-24 NOTE — Telephone Encounter (Signed)
PMP was reviewed, Oxycodone e-scribed. Placed a call to mr. Conley Canal, no answer. Left message to return the call.

## 2018-11-28 ENCOUNTER — Ambulatory Visit (INDEPENDENT_AMBULATORY_CARE_PROVIDER_SITE_OTHER): Payer: Medicare HMO | Admitting: Family Medicine

## 2018-11-28 ENCOUNTER — Encounter: Payer: Self-pay | Admitting: Family Medicine

## 2018-11-28 ENCOUNTER — Other Ambulatory Visit: Payer: Self-pay

## 2018-11-28 VITALS — BP 118/82 | HR 85 | Temp 96.7°F | Ht 71.0 in | Wt 213.4 lb

## 2018-11-28 DIAGNOSIS — R7401 Elevation of levels of liver transaminase levels: Secondary | ICD-10-CM

## 2018-11-28 DIAGNOSIS — R740 Nonspecific elevation of levels of transaminase and lactic acid dehydrogenase [LDH]: Secondary | ICD-10-CM | POA: Diagnosis not present

## 2018-11-28 DIAGNOSIS — G8929 Other chronic pain: Secondary | ICD-10-CM | POA: Diagnosis not present

## 2018-11-28 DIAGNOSIS — E119 Type 2 diabetes mellitus without complications: Secondary | ICD-10-CM

## 2018-11-28 DIAGNOSIS — M545 Low back pain: Secondary | ICD-10-CM | POA: Diagnosis not present

## 2018-11-28 LAB — COMPREHENSIVE METABOLIC PANEL
ALT: 35 U/L (ref 0–53)
AST: 37 U/L (ref 0–37)
Albumin: 4.3 g/dL (ref 3.5–5.2)
Alkaline Phosphatase: 63 U/L (ref 39–117)
BUN: 9 mg/dL (ref 6–23)
CO2: 32 mEq/L (ref 19–32)
Calcium: 10 mg/dL (ref 8.4–10.5)
Chloride: 99 mEq/L (ref 96–112)
Creatinine, Ser: 0.92 mg/dL (ref 0.40–1.50)
GFR: 84.74 mL/min (ref >60.00)
Glucose, Bld: 114 mg/dL — ABNORMAL HIGH (ref 70–99)
Potassium: 4.1 mEq/L (ref 3.5–5.1)
Sodium: 141 mEq/L (ref 135–145)
Total Bilirubin: 0.8 mg/dL (ref 0.2–1.2)
Total Protein: 6.8 g/dL (ref 6.0–8.3)

## 2018-11-28 LAB — HEMOGLOBIN A1C: Hgb A1c MFr Bld: 6.6 % — ABNORMAL HIGH (ref 4.6–6.5)

## 2018-11-28 NOTE — Progress Notes (Signed)
Subjective:   Chief Complaint  Patient presents with  . Follow-up    Jeffrey Austin is a 58 y.o. male here for follow-up of diabetes.   Jeffrey Austin does not check his sugars routinely.  Patient does not require insulin.   Medications include: Grams twice daily, metformin 1000 mg twice daily Exercise: Limited due to chronic pain Diet is fair, he is drinking 5-6 alcoholic drinks nightly  Call use, he did have an elevated AST level.  His wife had a similar issue and an ultrasound was ordered, however it would could have cost $1000 out of pocket.  He has a history of chronic low back pain.  He is currently following with a [facility] physical medicine and rehabilitation team.  He is requesting referral to a different pain specialist.  Past Medical History:  Diagnosis Date  . Back pain   . Diabetes mellitus without complication ([place])   . Hyperlipidemia   . Hypertension      Related testing: Date of retinal exam: Due Pneumovax: not done Flu Shot: not done  Review of Systems: Pulmonary:  No SOB Cardiovascular:  No chest pain  Objective:  BP 118/82 (BP Location: Left Arm, Patient Position: Sitting, Cuff Size: Normal)   Pulse 85   Temp (!) 96.7 F (35.9 C) (Temporal)   Ht 5\' 11"  (1.803 m)   Wt 213 lb 6 oz (96.8 kg)   SpO2 98%   BMI 29.76 kg/m  General:  Well developed, well nourished, in no apparent distress Skin:  Warm, no pallor or diaphoresis Head:  Normocephalic, atraumatic Eyes:  Pupils equal and round, sclera anicteric without injection  Lungs:  CTAB, no access msc use Cardio:  RRR, no bruits, no LE edema Psych: Age appropriate judgment and insight  Assessment:   Type 2 diabetes mellitus without complication, without long-term current use of insulin (HCC) - Plan: Hemoglobin A1c  Elevated AST (SGOT) - Plan: Comprehensive metabolic panel  Chronic bilateral low back pain without sciatica - Plan: Ambulatory referral to Pain Clinic   Plan:   Continue current  meds, will see what A1c is today. Follow-up on liver enzymes. Refer to [facility] medical pain management. F/u pending labs results.  The patient voiced understanding and agreement to the plan.  [place], DO 11/28/18 1:30 PM

## 2018-11-28 NOTE — Patient Instructions (Addendum)
Give [place] 2-3 business days to get the results of your labs back.   Keep the diet clean and stay active.  Call your eye doctor for your yearly appointment.    Let us know if you change your mind about the pneumonia vaccine or flu shot.   If you do not hear anything about your referral in the next 1-2 weeks, call our office and ask for an update.  Let us know if you need anything.

## 2018-12-19 ENCOUNTER — Encounter: Payer: Medicare HMO | Attending: Physical Medicine & Rehabilitation | Admitting: Registered Nurse

## 2018-12-19 ENCOUNTER — Other Ambulatory Visit: Payer: Self-pay

## 2018-12-19 ENCOUNTER — Encounter: Payer: Self-pay | Admitting: Registered Nurse

## 2018-12-19 VITALS — BP 141/91 | HR 96 | Temp 97.7°F | Ht 70.0 in | Wt 216.0 lb

## 2018-12-19 DIAGNOSIS — I10 Essential (primary) hypertension: Secondary | ICD-10-CM | POA: Diagnosis not present

## 2018-12-19 DIAGNOSIS — Z981 Arthrodesis status: Secondary | ICD-10-CM | POA: Insufficient documentation

## 2018-12-19 DIAGNOSIS — Z79891 Long term (current) use of opiate analgesic: Secondary | ICD-10-CM | POA: Diagnosis not present

## 2018-12-19 DIAGNOSIS — Z5181 Encounter for therapeutic drug level monitoring: Secondary | ICD-10-CM

## 2018-12-19 DIAGNOSIS — M545 Low back pain: Secondary | ICD-10-CM | POA: Insufficient documentation

## 2018-12-19 DIAGNOSIS — F17210 Nicotine dependence, cigarettes, uncomplicated: Secondary | ICD-10-CM | POA: Diagnosis not present

## 2018-12-19 DIAGNOSIS — G8929 Other chronic pain: Secondary | ICD-10-CM | POA: Insufficient documentation

## 2018-12-19 DIAGNOSIS — Z9889 Other specified postprocedural states: Secondary | ICD-10-CM | POA: Insufficient documentation

## 2018-12-19 DIAGNOSIS — M5416 Radiculopathy, lumbar region: Secondary | ICD-10-CM | POA: Diagnosis not present

## 2018-12-19 DIAGNOSIS — E119 Type 2 diabetes mellitus without complications: Secondary | ICD-10-CM | POA: Insufficient documentation

## 2018-12-19 DIAGNOSIS — M961 Postlaminectomy syndrome, not elsewhere classified: Secondary | ICD-10-CM | POA: Diagnosis not present

## 2018-12-19 DIAGNOSIS — G894 Chronic pain syndrome: Secondary | ICD-10-CM | POA: Diagnosis not present

## 2018-12-19 MED ORDER — OXYCODONE-ACETAMINOPHEN 10-325 MG PO TABS: ORAL_TABLET | ORAL | 0 refills | Status: DC

## 2018-12-19 NOTE — Progress Notes (Signed)
Subjective:    Patient ID: Jeffrey Austin, male    DOB: 1960-12-01, 58 y.o.   MRN: [id number]  HPI: Jeffrey Austin is a 58 y.o. male who returns for follow up appointment for chronic pain and medication refill. He states his pain is located in his lower back radiating into his bilateral lower extremities. He rates his pain 7. His current exercise regime is walking and performing stretching exercises.  Mr. Delagrange Morphine equivalent is 45.00 MME.  Oral Swab was Performed today.  Pain Inventory Average Pain 7 Pain Right Now 7 My pain is sharp, stabbing, tingling and aching  In the last 24 hours, has pain interfered with the following? General activity 7 Relation with others 4 Enjoyment of life 4 What TIME of day is your pain at its worst? morning and evening Sleep (in general) Fair  Pain is worse with: bending, sitting and inactivity Pain improves with: heat/ice, therapy/exercise and medication Relief from Meds: 4  Mobility Do you have any goals in this area?  no  Function Do you have any goals in this area?  no  Neuro/Psych No problems in this area  Prior Studies Any changes since last visit?  no  Physicians involved in your care Any changes since last visit?  no   Family History  Problem Relation Age of Onset  . Leukemia Father   . Colon cancer Neg Hx   . Esophageal cancer Neg Hx   . Rectal cancer Neg Hx   . Stomach cancer Neg Hx    Social History   Socioeconomic History  . Marital status: Married    Spouse name: Not on file  . Number of children: Not on file  . Years of education: Not on file  . Highest education level: Not on file  Occupational History  . Not on file  Social Needs  . Financial resource strain: Not on file  . Food insecurity    Worry: Not on file    Inability: Not on file  . Transportation needs    Medical: Not on file    Non-medical: Not on file  Tobacco Use  . Smoking status: Current Some Day Smoker    Packs/day: 0.25   Types: Cigarettes  . Smokeless tobacco: Never Used  Substance and Sexual Activity  . Alcohol use: Yes    Alcohol/week: 5.0 - 10.0 standard drinks    Types: 5 - 10 Standard drinks or equivalent per week  . Drug use: No  . Sexual activity: Yes  Lifestyle  . Physical activity    Days per week: Not on file    Minutes per session: Not on file  . Stress: Not on file  Relationships  . Social Herbalist on phone: Not on file    Gets together: Not on file    Attends religious service: Not on file    Active member of club or organization: Not on file    Attends meetings of clubs or organizations: Not on file    Relationship status: Not on file  Other Topics Concern  . Not on file  Social History Narrative  . Not on file   Past Surgical History:  Procedure Laterality Date  . cyst removed from testicle     . INCISIONAL HERNIA REPAIR N/A 02/01/2016   Procedure: LAPAROSCOPIC INCISIONAL HERNIA REPAIR;  Surgeon: Arta Bruce Kinsinger, MD;  Location: [facility];  Service: General;  Laterality: N/A;  . INSERTION OF MESH N/A 02/01/2016   Procedure:  INSERTION OF MESH;  Surgeon: Arta Bruce Kinsinger, MD;  Location: [facility];  Service: General;  Laterality: N/A;  . LUMBAR FUSION    . SPINAL FUSION     Past Medical History:  Diagnosis Date  . Back pain   . Diabetes mellitus without complication ([place])   . Hyperlipidemia   . Hypertension    BP (!) 141/91   Pulse 96   Temp 97.7 F (36.5 C)   Ht 5\' 10"  (1.778 m)   Wt 216 lb (98 kg)   SpO2 98%   BMI 30.99 kg/m   Opioid Risk Score:   Fall Risk Score:  `1  Depression screen PHQ 2/9  Depression screen [facility] 2/9 12/19/2018 08/06/2018 12/09/2017 09/02/2017  Decreased Interest 0 0 0 0  Down, Depressed, Hopeless - 0 0 0  PHQ - 2 Score 0 0 0 0    Review of Systems  All other systems reviewed and are negative.      Objective:   Physical Exam Vitals signs and nursing note reviewed.  Constitutional:      Appearance: Normal appearance.   Neck:     Musculoskeletal: Normal range of motion and neck supple.  Cardiovascular:     Rate and Rhythm: Normal rate and regular rhythm.     Pulses: Normal pulses.     Heart sounds: Normal heart sounds.  Pulmonary:     Effort: Pulmonary effort is normal.     Breath sounds: Normal breath sounds.  Musculoskeletal:     Comments: Normal Muscle Bulk and Muscle Testing Reveals:  Upper Extremities: Full  ROM and Muscle Strength 5/5  Lumbar Paraspinal Tenderness: L-3-L-5 Lower Extremities: Full ROM and Muscle Strength 5/5 Arises from chair with ease Narrow Based Gait   Skin:    General: Skin is warm and dry.  Neurological:     Mental Status: He is alert and oriented to person, place, and time.           Assessment & Plan:  1.Post Laminectomy Syndrome: Continue current medication regimen and HEP as tolerated.12/19/2018. 2. Chronic Lumbar Radiculopathy:S/P Lumbar Radiofrequency with Dr. Ernestina Patches on 12/24/2017, with relief noted. Awaiting on insurance approval for Lumbar Radiofrequency he reports. Continue to Monitor.12/19/2018. 3. Chronic pain Syndrome: Refilled:Oxycodone 10/325 mg one tablet every8hours as needed for moderate pain.#75. 12/19/2018. 4.Right Greater Trochanteric Bursitis: No complaints today.Continue to Alternate Ice and Heat Therapy.Continue to Monitor. 12/19/2018 5. Sacroilitis: S/P SI Injection by Dr. Ernestina Patches on 09/02/2018. With relief noted.Continue to Monitor. 12/19/2018.  40minutes of face to face patient care time was spent during this visit. All questions were encouraged and answered.  F/U in 1 month

## 2018-12-24 LAB — DRUG TOX MONITOR 1 W/CONF, ORAL FLD
Amphetamines: NEGATIVE ng/mL (ref <10)
Barbiturates: NEGATIVE ng/mL (ref <10)
Benzodiazepines: NEGATIVE ng/mL (ref <0.50)
Buprenorphine: NEGATIVE ng/mL (ref <0.10)
Cocaine: NEGATIVE ng/mL (ref <5.0)
Codeine: NEGATIVE ng/mL (ref <2.5)
Cotinine: 78.1 ng/mL — ABNORMAL HIGH (ref <5.0)
Dihydrocodeine: NEGATIVE ng/mL (ref <2.5)
Fentanyl: NEGATIVE ng/mL (ref <0.10)
Heroin Metabolite: NEGATIVE ng/mL (ref <1.0)
Hydrocodone: NEGATIVE ng/mL (ref <2.5)
Hydromorphone: NEGATIVE ng/mL (ref <2.5)
MARIJUANA: NEGATIVE ng/mL (ref <2.5)
MDMA: NEGATIVE ng/mL (ref <10)
Meprobamate: NEGATIVE ng/mL (ref <2.5)
Methadone: NEGATIVE ng/mL (ref <5.0)
Morphine: NEGATIVE ng/mL (ref <2.5)
Nicotine Metabolite: POSITIVE ng/mL — AB (ref <5.0)
Norhydrocodone: NEGATIVE ng/mL (ref <2.5)
Noroxycodone: 29.5 ng/mL — ABNORMAL HIGH (ref <2.5)
Opiates: POSITIVE ng/mL — AB (ref <2.5)
Oxycodone: >250 ng/mL — ABNORMAL HIGH (ref <2.5)
Oxymorphone: NEGATIVE ng/mL (ref <2.5)
Phencyclidine: NEGATIVE ng/mL (ref <10)
Tapentadol: NEGATIVE ng/mL (ref <5.0)
Tramadol: NEGATIVE ng/mL (ref <5.0)
Zolpidem: NEGATIVE ng/mL (ref <5.0)

## 2018-12-24 LAB — DRUG TOX ALC METAB W/CON, ORAL FLD: Alcohol Metabolite: NEGATIVE ng/mL (ref <25)

## 2018-12-31 ENCOUNTER — Telehealth: Payer: Self-pay | Admitting: *Deleted

## 2018-12-31 NOTE — Telephone Encounter (Signed)
Oral swab drug screen was consistent for prescribed medications.  ?

## 2019-01-23 ENCOUNTER — Other Ambulatory Visit: Payer: Self-pay

## 2019-01-23 ENCOUNTER — Encounter: Payer: Self-pay | Admitting: Registered Nurse

## 2019-01-23 ENCOUNTER — Encounter: Payer: Medicare HMO | Attending: Physical Medicine & Rehabilitation | Admitting: Registered Nurse

## 2019-01-23 VITALS — BP 159/99 | HR 100 | Temp 98.0°F | Ht 71.0 in | Wt 216.0 lb

## 2019-01-23 DIAGNOSIS — I10 Essential (primary) hypertension: Secondary | ICD-10-CM | POA: Insufficient documentation

## 2019-01-23 DIAGNOSIS — F17210 Nicotine dependence, cigarettes, uncomplicated: Secondary | ICD-10-CM | POA: Diagnosis not present

## 2019-01-23 DIAGNOSIS — E119 Type 2 diabetes mellitus without complications: Secondary | ICD-10-CM | POA: Insufficient documentation

## 2019-01-23 DIAGNOSIS — Z981 Arthrodesis status: Secondary | ICD-10-CM | POA: Diagnosis not present

## 2019-01-23 DIAGNOSIS — G894 Chronic pain syndrome: Secondary | ICD-10-CM | POA: Diagnosis not present

## 2019-01-23 DIAGNOSIS — Z5181 Encounter for therapeutic drug level monitoring: Secondary | ICD-10-CM

## 2019-01-23 DIAGNOSIS — M7061 Trochanteric bursitis, right hip: Secondary | ICD-10-CM | POA: Diagnosis not present

## 2019-01-23 DIAGNOSIS — M545 Low back pain: Secondary | ICD-10-CM | POA: Insufficient documentation

## 2019-01-23 DIAGNOSIS — G8929 Other chronic pain: Secondary | ICD-10-CM | POA: Diagnosis not present

## 2019-01-23 DIAGNOSIS — Z79891 Long term (current) use of opiate analgesic: Secondary | ICD-10-CM

## 2019-01-23 DIAGNOSIS — M7062 Trochanteric bursitis, left hip: Secondary | ICD-10-CM | POA: Diagnosis not present

## 2019-01-23 DIAGNOSIS — M961 Postlaminectomy syndrome, not elsewhere classified: Secondary | ICD-10-CM | POA: Insufficient documentation

## 2019-01-23 DIAGNOSIS — Z9889 Other specified postprocedural states: Secondary | ICD-10-CM | POA: Diagnosis not present

## 2019-01-23 DIAGNOSIS — M5416 Radiculopathy, lumbar region: Secondary | ICD-10-CM

## 2019-01-23 MED ORDER — OXYCODONE-ACETAMINOPHEN 10-325 MG PO TABS: ORAL_TABLET | ORAL | 0 refills | Status: DC

## 2019-01-23 NOTE — Progress Notes (Signed)
Subjective:    Patient ID: Jeffrey Austin, male    DOB: May 21, 1960, 58 y.o.   MRN: [id number]  HPI: Jeffrey Austin is a 58 y.o. male who returns for follow up appointment for chronic pain and medication refill. He states his pain is located in his mid- lower back radiating into his bilateral lower extremities and bilateral hips. H rates his  Pain 7. His current exercise regime is walking and performing stretching exercises.  Jeffrey Austin states his wife lost her job a few months ago and has lost her employment benefits. He's very stressed concerning his finances. We will allow him to do a tele-health visit next month, he verbalizes understanding.   Jeffrey Austin Morphine equivalent is 45.00  MME. Last Oral Swab was Performed on 12/19/2018, it was consistent.     Pain Inventory Average Pain 7 Pain Right Now 7 My pain is sharp, burning, stabbing, tingling and aching  In the last 24 hours, has pain interfered with the following? General activity 0 Relation with others 0 Enjoyment of life 0 What TIME of day is your pain at its worst? morning, evening Sleep (in general) Fair  Pain is worse with: bending, sitting and inactivity Pain improves with: heat/ice, pacing activities and medication Relief from Meds: 5  Mobility walk without assistance Do you have any goals in this area?  no  Function Do you have any goals in this area?  no  Neuro/Psych No problems in this area  Prior Studies Any changes since last visit?  no  Physicians involved in your care Any changes since last visit?  no   Family History  Problem Relation Age of Onset  . Leukemia Father   . Colon cancer Neg Hx   . Esophageal cancer Neg Hx   . Rectal cancer Neg Hx   . Stomach cancer Neg Hx    Social History   Socioeconomic History  . Marital status: Married    Spouse name: Not on file  . Number of children: Not on file  . Years of education: Not on file  . Highest education level: Not on file   Occupational History  . Not on file  Social Needs  . Financial resource strain: Not on file  . Food insecurity    Worry: Not on file    Inability: Not on file  . Transportation needs    Medical: Not on file    Non-medical: Not on file  Tobacco Use  . Smoking status: Current Some Day Smoker    Packs/day: 0.25    Types: Cigarettes  . Smokeless tobacco: Never Used  Substance and Sexual Activity  . Alcohol use: Yes    Alcohol/week: 5.0 - 10.0 standard drinks    Types: 5 - 10 Standard drinks or equivalent per week  . Drug use: No  . Sexual activity: Yes  Lifestyle  . Physical activity    Days per week: Not on file    Minutes per session: Not on file  . Stress: Not on file  Relationships  . Social Herbalist on phone: Not on file    Gets together: Not on file    Attends religious service: Not on file    Active member of club or organization: Not on file    Attends meetings of clubs or organizations: Not on file    Relationship status: Not on file  Other Topics Concern  . Not on file  Social History Narrative  . Not on  file   Past Surgical History:  Procedure Laterality Date  . cyst removed from testicle     . INCISIONAL HERNIA REPAIR N/A 02/01/2016   Procedure: LAPAROSCOPIC INCISIONAL HERNIA REPAIR;  Surgeon: Arta Bruce Kinsinger, MD;  Location: [facility];  Service: General;  Laterality: N/A;  . INSERTION OF MESH N/A 02/01/2016   Procedure: INSERTION OF MESH;  Surgeon: Arta Bruce Kinsinger, MD;  Location: [facility];  Service: General;  Laterality: N/A;  . LUMBAR FUSION    . SPINAL FUSION     Past Medical History:  Diagnosis Date  . Back pain   . Diabetes mellitus without complication ([place])   . Hyperlipidemia   . Hypertension    BP (!) 159/99   Pulse 100   Temp 98 F (36.7 C)   Ht 5\' 11"  (1.803 m)   Wt 216 lb (98 kg)   SpO2 98%   BMI 30.13 kg/m   Opioid Risk Score:   Fall Risk Score:  `1  Depression screen PHQ 2/9  Depression screen [facility] 2/9  12/19/2018 08/06/2018 12/09/2017 09/02/2017  Decreased Interest 0 0 0 0  Down, Depressed, Hopeless - 0 0 0  PHQ - 2 Score 0 0 0 0     Review of Systems  Constitutional: Negative.   HENT: Negative.   Eyes: Negative.   Respiratory: Negative.   Cardiovascular: Negative.   Gastrointestinal: Negative.   Endocrine: Negative.   Genitourinary: Negative.   Musculoskeletal: Positive for arthralgias, back pain and gait problem.  Skin: Negative.   Allergic/Immunologic: Negative.   Hematological: Negative.   Psychiatric/Behavioral: Negative.   All other systems reviewed and are negative.      Objective:   Physical Exam Vitals signs and nursing note reviewed.  Constitutional:      Appearance: Normal appearance.  Neck:     Musculoskeletal: Normal range of motion and neck supple.  Cardiovascular:     Rate and Rhythm: Normal rate and regular rhythm.     Pulses: Normal pulses.     Heart sounds: Normal heart sounds.  Pulmonary:     Effort: Pulmonary effort is normal.     Breath sounds: Normal breath sounds.  Musculoskeletal:     Comments: Normal Muscle Bulk and Muscle Testing Reveals:  Upper Extremities:Full  ROM and Muscle Strength 5/5  Lumbar Hypersensitivity Bilateral Greater Trochanter Tenderness Lower Extremities: Full ROM and Muscle Strength 5/5 Bilateral Lower Extremities Flexion Produces Pain into his Lumbar Arises from chair slowly Antalgic Gait   Skin:    General: Skin is warm and dry.  Neurological:     Mental Status: He is alert and oriented to person, place, and time.  Psychiatric:        Mood and Affect: Mood normal.        Behavior: Behavior normal.           Assessment & Plan:  1.Post Laminectomy Syndrome: Continue current medication regimen and HEP as tolerated.01/23/2019. 2. Chronic Lumbar Radiculopathy:S/P Lumbar Radiofrequency with Dr. Ernestina Patches on 12/24/2017, with relief noted.Awaiting on insurance approval for Lumbar Radiofrequency he reports.Continue  to Monitor.01/23/2019. 3. Chronic pain Syndrome: Refilled:Oxycodone 10/325 mg one tablet every8hours as needed for moderate pain.#75. 01/23/2019. 4.Bilateral Greater Trochanteric Bursitis:Continue to Alternate Ice and Heat Therapy.Continue to Monitor.01/23/2019 5. Sacroilitis: S/P SI Injection by Dr. Ernestina Patches on 09/02/2018. With relief noted.Continue to Monitor. 01/23/2019.  85minutes of face to face patient care time was spent during this visit. All questions were encouraged and answered.  F/U in 1 month

## 2019-02-09 ENCOUNTER — Telehealth: Payer: Self-pay

## 2019-02-09 NOTE — Telephone Encounter (Signed)
Ptn states down to one pill not at [facility] - can we please send refill [phone]

## 2019-02-09 NOTE — Telephone Encounter (Signed)
Called [facility] pharmacy, they have the script on file and are now getting it ready for patient.  Called and informed patient.

## 2019-02-25 ENCOUNTER — Ambulatory Visit: Payer: Medicare HMO | Admitting: Family Medicine

## 2019-03-02 ENCOUNTER — Other Ambulatory Visit: Payer: Self-pay

## 2019-03-02 ENCOUNTER — Encounter: Payer: Medicare HMO | Attending: Physical Medicine & Rehabilitation | Admitting: Registered Nurse

## 2019-03-02 ENCOUNTER — Encounter: Payer: Self-pay | Admitting: Registered Nurse

## 2019-03-02 VITALS — BP 130/90 | Ht 70.0 in | Wt 210.0 lb

## 2019-03-02 DIAGNOSIS — Z79891 Long term (current) use of opiate analgesic: Secondary | ICD-10-CM

## 2019-03-02 DIAGNOSIS — M461 Sacroiliitis, not elsewhere classified: Secondary | ICD-10-CM

## 2019-03-02 DIAGNOSIS — Z5181 Encounter for therapeutic drug level monitoring: Secondary | ICD-10-CM

## 2019-03-02 DIAGNOSIS — G894 Chronic pain syndrome: Secondary | ICD-10-CM

## 2019-03-02 DIAGNOSIS — M545 Low back pain: Secondary | ICD-10-CM | POA: Insufficient documentation

## 2019-03-02 DIAGNOSIS — E119 Type 2 diabetes mellitus without complications: Secondary | ICD-10-CM | POA: Insufficient documentation

## 2019-03-02 DIAGNOSIS — M7062 Trochanteric bursitis, left hip: Secondary | ICD-10-CM | POA: Diagnosis not present

## 2019-03-02 DIAGNOSIS — I10 Essential (primary) hypertension: Secondary | ICD-10-CM | POA: Insufficient documentation

## 2019-03-02 DIAGNOSIS — M7061 Trochanteric bursitis, right hip: Secondary | ICD-10-CM | POA: Diagnosis not present

## 2019-03-02 DIAGNOSIS — M961 Postlaminectomy syndrome, not elsewhere classified: Secondary | ICD-10-CM | POA: Diagnosis not present

## 2019-03-02 DIAGNOSIS — F17210 Nicotine dependence, cigarettes, uncomplicated: Secondary | ICD-10-CM | POA: Insufficient documentation

## 2019-03-02 DIAGNOSIS — Z981 Arthrodesis status: Secondary | ICD-10-CM | POA: Insufficient documentation

## 2019-03-02 DIAGNOSIS — M5416 Radiculopathy, lumbar region: Secondary | ICD-10-CM

## 2019-03-02 DIAGNOSIS — G8929 Other chronic pain: Secondary | ICD-10-CM

## 2019-03-02 DIAGNOSIS — Z9889 Other specified postprocedural states: Secondary | ICD-10-CM | POA: Insufficient documentation

## 2019-03-02 MED ORDER — OXYCODONE-ACETAMINOPHEN 10-325 MG PO TABS: ORAL_TABLET | ORAL | 0 refills | Status: DC

## 2019-03-02 NOTE — Progress Notes (Signed)
Subjective:    Patient ID: Jeffrey Austin, male    DOB: 05/20/1960, 58 y.o.   MRN: [id number]  HPI: Jeffrey Austin is a 58 y.o. male whose appointment was changed  to a virtual office visit to reduce the risk of exposure to the COVID-19 virus and to help Jeffrey Austin  remain healthy and safe. The virtual visit will also provide continuity of care. Jeffrey Austin agrees to virtual visit and verbalizes understanding.  He states his pain is located in his mid- lower back radiating into his bilateral lower extremities. He rates his pain 7. His  current exercise regime is walking   Jeffrey Austin Morphine equivalent is 45.00 MME.  Last Oral Swab was Performed on 12/19/2018, it was consistent.   Pain Inventory Average Pain 7 Pain Right Now 7 My pain is constant, sharp and stabbing  In the last 24 hours, has pain interfered with the following? General activity 5 Relation with others 5 Enjoyment of life 5 What TIME of day is your pain at its worst? morning Sleep (in general) Fair  Pain is worse with: bending and cold air Pain improves with: heat/ice and medication Relief from Meds: 5  Mobility walk without assistance ability to climb steps?  yes do you drive?  no  Function disabled: date disabled .  Neuro/Psych No problems in this area  Prior Studies Any changes since last visit?  no  Physicians involved in your care Any changes since last visit?  no   Family History  Problem Relation Age of Onset  . Leukemia Father   . Colon cancer Neg Hx   . Esophageal cancer Neg Hx   . Rectal cancer Neg Hx   . Stomach cancer Neg Hx    Social History   Socioeconomic History  . Marital status: Married    Spouse name: Not on file  . Number of children: Not on file  . Years of education: Not on file  . Highest education level: Not on file  Occupational History  . Not on file  Tobacco Use  . Smoking status: Current Some Day Smoker    Packs/day: 0.25    Types: Cigarettes  .  Smokeless tobacco: Never Used  Substance and Sexual Activity  . Alcohol use: Yes    Alcohol/week: 5.0 - 10.0 standard drinks    Types: 5 - 10 Standard drinks or equivalent per week  . Drug use: No  . Sexual activity: Yes  Other Topics Concern  . Not on file  Social History Narrative  . Not on file   Social Determinants of Health   Financial Resource Strain:   . Difficulty of Paying Living Expenses: Not on file  Food Insecurity:   . Worried About Charity fundraiser in the Last Year: Not on file  . Ran Out of Food in the Last Year: Not on file  Transportation Needs:   . Lack of Transportation (Medical): Not on file  . Lack of Transportation (Non-Medical): Not on file  Physical Activity:   . Days of Exercise per Week: Not on file  . Minutes of Exercise per Session: Not on file  Stress:   . Feeling of Stress : Not on file  Social Connections:   . Frequency of Communication with Friends and Family: Not on file  . Frequency of Social Gatherings with Friends and Family: Not on file  . Attends Religious Services: Not on file  . Active Member of Clubs or Organizations: Not on file  .  Attends Archivist Meetings: Not on file  . Marital Status: Not on file   Past Surgical History:  Procedure Laterality Date  . cyst removed from testicle     . INCISIONAL HERNIA REPAIR N/A 02/01/2016   Procedure: LAPAROSCOPIC INCISIONAL HERNIA REPAIR;  Surgeon: Arta Bruce Kinsinger, MD;  Location: [facility];  Service: General;  Laterality: N/A;  . INSERTION OF MESH N/A 02/01/2016   Procedure: INSERTION OF MESH;  Surgeon: Arta Bruce Kinsinger, MD;  Location: [facility];  Service: General;  Laterality: N/A;  . LUMBAR FUSION    . SPINAL FUSION     Past Medical History:  Diagnosis Date  . Back pain   . Diabetes mellitus without complication ([place])   . Hyperlipidemia   . Hypertension    There were no vitals taken for this visit.  Opioid Risk Score:   Fall Risk Score:  `1  Depression screen  PHQ 2/9  Depression screen [facility] 2/9 12/19/2018 08/06/2018 12/09/2017 09/02/2017  Decreased Interest 0 0 0 0  Down, Depressed, Hopeless - 0 0 0  PHQ - 2 Score 0 0 0 0     Review of Systems  Constitutional: Negative.   HENT: Negative.   Eyes: Negative.   Respiratory: Negative.   Cardiovascular: Negative.   Gastrointestinal: Negative.   Endocrine: Negative.   Genitourinary: Negative.   Musculoskeletal: Positive for arthralgias and back pain.  Skin: Negative.   Allergic/Immunologic: Negative.   Neurological: Negative.   Hematological: Negative.   Psychiatric/Behavioral: Negative.   All other systems reviewed and are negative.      Objective:   Physical Exam Vitals and nursing note reviewed.  Musculoskeletal:     Comments: No Physical Exam: Virtual Visit           Assessment & Plan:  1.Post Laminectomy Syndrome: Continue current medication regimen and HEP as tolerated.03/02/2019. 2. Chronic Lumbar Radiculopathy:S/P Lumbar Radiofrequency with Dr. Ernestina Patches on 12/24/2017, with relief noted.Awaiting on insurance approval for Lumbar Radiofrequency he reports.Continue to Monitor.03/02/2019. 3. Chronic pain Syndrome: Refilled:Oxycodone 10/325 mg one tablet every8hours as needed for moderate pain.#75.03/02/2019. 4.Bilateral Greater Trochanteric Bursitis:Continue to Alternate Ice and Heat Therapy.Continue to Monitor.03/02/2019 5. Sacroilitis: S/P SI Injection by Dr. Ernestina Patches on 09/02/2018. With relief noted.Continue to Monitor. 03/02/2019.  31minutes of face to face patient care time was spent during this visit. All questions were encouraged and answered.  F/U in 1 month

## 2019-03-22 ENCOUNTER — Other Ambulatory Visit: Payer: Self-pay | Admitting: Family Medicine

## 2019-03-22 DIAGNOSIS — E119 Type 2 diabetes mellitus without complications: Secondary | ICD-10-CM

## 2019-03-27 ENCOUNTER — Encounter: Payer: Self-pay | Admitting: Family Medicine

## 2019-03-27 MED ORDER — VARENICLINE TARTRATE 0.5 MG X 11 & 1 MG X 42 PO MISC: ORAL | 0 refills | Status: DC

## 2019-03-31 ENCOUNTER — Other Ambulatory Visit: Payer: Self-pay

## 2019-03-31 ENCOUNTER — Encounter: Payer: Medicare HMO | Attending: Physical Medicine & Rehabilitation | Admitting: Registered Nurse

## 2019-03-31 ENCOUNTER — Encounter: Payer: Self-pay | Admitting: Registered Nurse

## 2019-03-31 VITALS — BP 131/89 | Ht 70.0 in | Wt 215.0 lb

## 2019-03-31 DIAGNOSIS — E119 Type 2 diabetes mellitus without complications: Secondary | ICD-10-CM | POA: Insufficient documentation

## 2019-03-31 DIAGNOSIS — Z79891 Long term (current) use of opiate analgesic: Secondary | ICD-10-CM | POA: Diagnosis not present

## 2019-03-31 DIAGNOSIS — F17210 Nicotine dependence, cigarettes, uncomplicated: Secondary | ICD-10-CM | POA: Insufficient documentation

## 2019-03-31 DIAGNOSIS — I10 Essential (primary) hypertension: Secondary | ICD-10-CM | POA: Insufficient documentation

## 2019-03-31 DIAGNOSIS — M545 Low back pain, unspecified: Secondary | ICD-10-CM

## 2019-03-31 DIAGNOSIS — Z5181 Encounter for therapeutic drug level monitoring: Secondary | ICD-10-CM | POA: Diagnosis not present

## 2019-03-31 DIAGNOSIS — Z9889 Other specified postprocedural states: Secondary | ICD-10-CM | POA: Insufficient documentation

## 2019-03-31 DIAGNOSIS — M5416 Radiculopathy, lumbar region: Secondary | ICD-10-CM

## 2019-03-31 DIAGNOSIS — G894 Chronic pain syndrome: Secondary | ICD-10-CM | POA: Diagnosis not present

## 2019-03-31 DIAGNOSIS — M961 Postlaminectomy syndrome, not elsewhere classified: Secondary | ICD-10-CM

## 2019-03-31 DIAGNOSIS — Z981 Arthrodesis status: Secondary | ICD-10-CM | POA: Insufficient documentation

## 2019-03-31 DIAGNOSIS — G8929 Other chronic pain: Secondary | ICD-10-CM | POA: Diagnosis not present

## 2019-03-31 MED ORDER — OXYCODONE-ACETAMINOPHEN 10-325 MG PO TABS: ORAL_TABLET | ORAL | 0 refills | Status: DC

## 2019-03-31 NOTE — Progress Notes (Signed)
Subjective:    Patient ID: Jeffrey Austin, male    DOB: 1960-07-26, 59 y.o.   MRN: [id number]  HPI: Jeffrey Austin is a 59 y.o. male whose appointment was changed to a virtual office visit to reduce the risk of exposure to the COVID-19 virus and to help Jeffrey Austin  remain healthy and safe. The virtual visit will also provide continuity of care. Jeffrey Austin agrees with vitrual vist verbalizes understanding.   He states his pain is located in his lower back radiating into his bilateral lower extremities. He rates his pain 9. His current exercise regime is walking and performing stretching exercises.  Mr. Jacquez Morphine equivalent is 45.00 MME.    Last Oral Swab was Performed on 12/19/2018, it was consistent.   Jeffrey Austin CMA asked the Health and History Questions. This provider and Jeffrey Austin verified we were speaking with the correct person using two identifiers.   Pain Inventory Average Pain 6 Pain Right Now 9 My pain is sharp and stabbing  In the last 24 hours, has pain interfered with the following? General activity 9 Relation with others 9 Enjoyment of life 9 What TIME of day is your pain at its worst? daytime Sleep (in general) Fair  Pain is worse with: sitting, inactivity and some activites Pain improves with: heat/ice, therapy/exercise, pacing activities and medication Relief from Meds: 5  Mobility walk without assistance ability to climb steps?  yes do you drive?  yes  Function disabled: date disabled .  Neuro/Psych No problems in this area  Prior Studies Any changes since last visit?  no  Physicians involved in your care Any changes since last visit?  no   Family History  Problem Relation Age of Onset  . Leukemia Father   . Colon cancer Neg Hx   . Esophageal cancer Neg Hx   . Rectal cancer Neg Hx   . Stomach cancer Neg Hx    Social History   Socioeconomic History  . Marital status: Married    Spouse name: Not on file  . Number of  children: Not on file  . Years of education: Not on file  . Highest education level: Not on file  Occupational History  . Not on file  Tobacco Use  . Smoking status: Current Some Day Smoker    Packs/day: 0.25    Types: Cigarettes  . Smokeless tobacco: Never Used  Substance and Sexual Activity  . Alcohol use: Yes    Alcohol/week: 5.0 - 10.0 standard drinks    Types: 5 - 10 Standard drinks or equivalent per week  . Drug use: No  . Sexual activity: Yes  Other Topics Concern  . Not on file  Social History Narrative  . Not on file   Social Determinants of Health   Financial Resource Strain:   . Difficulty of Paying Living Expenses: Not on file  Food Insecurity:   . Worried About Charity fundraiser in the Last Year: Not on file  . Ran Out of Food in the Last Year: Not on file  Transportation Needs:   . Lack of Transportation (Medical): Not on file  . Lack of Transportation (Non-Medical): Not on file  Physical Activity:   . Days of Exercise per Week: Not on file  . Minutes of Exercise per Session: Not on file  Stress:   . Feeling of Stress : Not on file  Social Connections:   . Frequency of Communication with Friends and Family: Not on file  .  Frequency of Social Gatherings with Friends and Family: Not on file  . Attends Religious Services: Not on file  . Active Member of Clubs or Organizations: Not on file  . Attends Archivist Meetings: Not on file  . Marital Status: Not on file   Past Surgical History:  Procedure Laterality Date  . cyst removed from testicle     . INCISIONAL HERNIA REPAIR N/A 02/01/2016   Procedure: LAPAROSCOPIC INCISIONAL HERNIA REPAIR;  Surgeon: Jeffrey Bruce Kinsinger, MD;  Location: [facility];  Service: General;  Laterality: N/A;  . INSERTION OF MESH N/A 02/01/2016   Procedure: INSERTION OF MESH;  Surgeon: Jeffrey Bruce Kinsinger, MD;  Location: [facility];  Service: General;  Laterality: N/A;  . LUMBAR FUSION    . SPINAL FUSION     Past Medical  History:  Diagnosis Date  . Back pain   . Diabetes mellitus without complication ([place])   . Hyperlipidemia   . Hypertension    There were no vitals taken for this visit.  Opioid Risk Score:   Fall Risk Score:  `1  Depression screen PHQ 2/9  Depression screen [facility] 2/9 12/19/2018 08/06/2018 12/09/2017 09/02/2017  Decreased Interest 0 0 0 0  Down, Depressed, Hopeless - 0 0 0  PHQ - 2 Score 0 0 0 0     Review of Systems  Constitutional: Negative.   HENT: Negative.   Eyes: Negative.   Respiratory: Negative.   Cardiovascular: Negative.   Gastrointestinal: Negative.   Endocrine: Negative.   Genitourinary: Negative.   Musculoskeletal: Positive for arthralgias and back pain.  Skin: Negative.   Allergic/Immunologic: Negative.   Neurological: Negative.   Hematological: Negative.   Psychiatric/Behavioral: Negative.   All other systems reviewed and are negative.      Objective:   Physical Exam Vitals and nursing note reviewed.           Assessment & Plan:  1.Post Laminectomy Syndrome: Continue current medication regimen and HEP as tolerated.03/31/2019. 2. Chronic Lumbar Radiculopathy:S/P Lumbar Radiofrequency with Dr. Ernestina Patches on 12/24/2017, with relief noted.Awaiting on insurance approval for Lumbar Radiofrequency he reports.Continue to Monitor.03/31/2019. 3. Chronic pain Syndrome: Refilled:Oxycodone 10/325 mg one tablet every8hours as needed for moderate pain.#75.03/31/2019. 4.BilateralGreater Trochanteric Bursitis:No complaints today.Continue to Alternate Ice and Heat Therapy.Continue to Monitor.03/31/2019 5. Sacroilitis: S/P SI Injection by Dr. Ernestina Patches on 09/02/2018. With relief noted.Continue to Monitor. 03/31/2019.  F/U in 1 month  Tele-Health Visit Telephone Call Established Patient Location of Patient: In His Home Location of Provider: In the Office

## 2019-04-20 ENCOUNTER — Other Ambulatory Visit: Payer: Self-pay | Admitting: Family Medicine

## 2019-04-20 DIAGNOSIS — E119 Type 2 diabetes mellitus without complications: Secondary | ICD-10-CM

## 2019-04-30 ENCOUNTER — Encounter: Payer: Medicare HMO | Attending: Physical Medicine & Rehabilitation | Admitting: Registered Nurse

## 2019-04-30 ENCOUNTER — Encounter: Payer: Self-pay | Admitting: Registered Nurse

## 2019-04-30 ENCOUNTER — Other Ambulatory Visit: Payer: Self-pay

## 2019-04-30 VITALS — BP 152/98 | HR 99 | Temp 97.7°F | Ht 70.0 in | Wt 214.0 lb

## 2019-04-30 DIAGNOSIS — G8929 Other chronic pain: Secondary | ICD-10-CM | POA: Insufficient documentation

## 2019-04-30 DIAGNOSIS — M5416 Radiculopathy, lumbar region: Secondary | ICD-10-CM | POA: Diagnosis not present

## 2019-04-30 DIAGNOSIS — G894 Chronic pain syndrome: Secondary | ICD-10-CM | POA: Diagnosis not present

## 2019-04-30 DIAGNOSIS — M545 Low back pain: Secondary | ICD-10-CM | POA: Diagnosis not present

## 2019-04-30 DIAGNOSIS — Z5181 Encounter for therapeutic drug level monitoring: Secondary | ICD-10-CM | POA: Diagnosis not present

## 2019-04-30 DIAGNOSIS — Z79891 Long term (current) use of opiate analgesic: Secondary | ICD-10-CM

## 2019-04-30 DIAGNOSIS — M961 Postlaminectomy syndrome, not elsewhere classified: Secondary | ICD-10-CM | POA: Insufficient documentation

## 2019-04-30 DIAGNOSIS — E119 Type 2 diabetes mellitus without complications: Secondary | ICD-10-CM | POA: Diagnosis not present

## 2019-04-30 DIAGNOSIS — Z9889 Other specified postprocedural states: Secondary | ICD-10-CM | POA: Insufficient documentation

## 2019-04-30 DIAGNOSIS — Z981 Arthrodesis status: Secondary | ICD-10-CM | POA: Diagnosis not present

## 2019-04-30 DIAGNOSIS — I10 Essential (primary) hypertension: Secondary | ICD-10-CM | POA: Insufficient documentation

## 2019-04-30 DIAGNOSIS — F17210 Nicotine dependence, cigarettes, uncomplicated: Secondary | ICD-10-CM | POA: Diagnosis not present

## 2019-04-30 NOTE — Patient Instructions (Signed)
Call office or send My Chart message  the week of March 15th regarding you're medication. Let me know how many tablets.

## 2019-04-30 NOTE — Progress Notes (Signed)
Subjective:    Patient ID: Jeffrey Austin, male    DOB: [birth year], 59 y.o.   MRN: [id number]  HPI: Jeffrey Austin is a 59 y.o. male who returns for follow up appointment for chronic pain and medication refill. He states his pain is located in his lower back pain radiating into his right hip and bilateral lower extremities. He rates his pain 7. His current exercise regime is walking and performing stretching exercises.   Jeffrey Austin Morphine equivalent is  45.00  MME.   ( PMP System Temporily Down, this Provider Placed a Call)  Last Oral Swab was Performed on 12/19/2018, it was consistent.    Pain Inventory Average Pain 8 Pain Right Now 7 My pain is burning, stabbing, tingling and aching  In the last 24 hours, has pain interfered with the following? General activity 8 Relation with others 2 Enjoyment of life 3 What TIME of day is your pain at its worst? evening Sleep (in general) Fair  Pain is worse with: bending, sitting and inactivity Pain improves with: heat/ice, therapy/exercise and medication Relief from Meds: 5  Mobility Do you have any goals in this area?  no  Function Do you have any goals in this area?  no  Neuro/Psych No problems in this area  Prior Studies Any changes since last visit?  no  Physicians involved in your care Any changes since last visit?  no   Family History  Problem Relation Age of Onset  . Leukemia Father   . Colon cancer Neg Hx   . Esophageal cancer Neg Hx   . Rectal cancer Neg Hx   . Stomach cancer Neg Hx    Social History   Socioeconomic History  . Marital status: Married    Spouse name: Not on file  . Number of children: Not on file  . Years of education: Not on file  . Highest education level: Not on file  Occupational History  . Not on file  Tobacco Use  . Smoking status: Current Some Day Smoker    Packs/day: 0.25    Types: Cigarettes  . Smokeless tobacco: Never Used  Substance and Sexual Activity  . Alcohol  use: Yes    Alcohol/week: 5.0 - 10.0 standard drinks    Types: 5 - 10 Standard drinks or equivalent per week  . Drug use: No  . Sexual activity: Yes  Other Topics Concern  . Not on file  Social History Narrative  . Not on file   Social Determinants of Health   Financial Resource Strain:   . Difficulty of Paying Living Expenses: Not on file  Food Insecurity:   . Worried About Charity fundraiser in the Last Year: Not on file  . Ran Out of Food in the Last Year: Not on file  Transportation Needs:   . Lack of Transportation (Medical): Not on file  . Lack of Transportation (Non-Medical): Not on file  Physical Activity:   . Days of Exercise per Week: Not on file  . Minutes of Exercise per Session: Not on file  Stress:   . Feeling of Stress : Not on file  Social Connections:   . Frequency of Communication with Friends and Family: Not on file  . Frequency of Social Gatherings with Friends and Family: Not on file  . Attends Religious Services: Not on file  . Active Member of Clubs or Organizations: Not on file  . Attends Archivist Meetings: Not on file  . Marital  Status: Not on file   Past Surgical History:  Procedure Laterality Date  . cyst removed from testicle     . INCISIONAL HERNIA REPAIR N/A 02/01/2016   Procedure: LAPAROSCOPIC INCISIONAL HERNIA REPAIR;  Surgeon: Arta Bruce Kinsinger, MD;  Location: [facility];  Service: General;  Laterality: N/A;  . INSERTION OF MESH N/A 02/01/2016   Procedure: INSERTION OF MESH;  Surgeon: Arta Bruce Kinsinger, MD;  Location: [facility];  Service: General;  Laterality: N/A;  . LUMBAR FUSION    . SPINAL FUSION     Past Medical History:  Diagnosis Date  . Back pain   . Diabetes mellitus without complication ([place])   . Hyperlipidemia   . Hypertension    Temp 97.7 F (36.5 C)   Ht 5\' 10"  (1.778 m)   Wt 214 lb (97.1 kg)   BMI 30.71 kg/m   Opioid Risk Score:   Fall Risk Score:  `1  Depression screen PHQ 2/9  Depression screen  [facility] 2/9 12/19/2018 08/06/2018 12/09/2017 09/02/2017  Decreased Interest 0 0 0 0  Down, Depressed, Hopeless - 0 0 0  PHQ - 2 Score 0 0 0 0    Review of Systems  Constitutional: Negative.   HENT: Negative.   Eyes: Negative.   Respiratory: Negative.   Cardiovascular: Negative.   Gastrointestinal: Negative.   Endocrine: Negative.   Genitourinary: Negative.   Musculoskeletal: Positive for back pain.  Allergic/Immunologic: Negative.   Neurological: Negative.   Hematological: Negative.   Psychiatric/Behavioral: Negative.   All other systems reviewed and are negative.      Objective:   Physical Exam Vitals and nursing note reviewed.  Constitutional:      Appearance: Normal appearance.  Cardiovascular:     Rate and Rhythm: Normal rate and regular rhythm.     Pulses: Normal pulses.     Heart sounds: Normal heart sounds.  Pulmonary:     Effort: Pulmonary effort is normal.     Breath sounds: Normal breath sounds.  Musculoskeletal:     Cervical back: Normal range of motion and neck supple.     Comments: Normal Muscle Bulk and Muscle Testing Reveals:  Upper Extremities: Full ROM and Muscle Strength 5/5 Lumbar Hypersensitivity Lower Extremities: Full ROM and Muscle Strength 5/5 Right Lower Extremity Flexion Produces Pain into his right lower extremity Arises from chair slowly Antalgic  Gait   Skin:    General: Skin is warm and dry.  Neurological:     Mental Status: He is alert and oriented to person, place, and time.  Psychiatric:        Mood and Affect: Mood normal.        Behavior: Behavior normal.           Assessment & Plan:  1.Post Laminectomy Syndrome: Continue current medication regimen and HEP as tolerated.04/29/2018. 2. Chronic Lumbar Radiculopathy:S/P Lumbar Radiofrequency with Dr. Ernestina Patches on 12/24/2017, with relief noted.Awaiting on insurance approval for Lumbar Radiofrequency he reports.Continue to Monitor.04/30/2019. 3. Chronic pain Syndrome:  Refilled:Oxycodone 10/325 mg one tablet every8hours as needed for moderate pain.#75.04/30/2019. 4.BilateralGreater Trochanteric Bursitis:Continue to Alternate Ice and Heat Therapy.Continue to Monitor.04/30/2019 5. Sacroilitis: S/P SI Injection by Dr. Ernestina Patches on 09/02/2018. With relief noted.Continue to Monitor. 04/30/2019.  44minutes of face to face patient care time was spent during this visit. All questions were encouraged and answered.  F/U in 1 month

## 2019-05-29 ENCOUNTER — Ambulatory Visit (INDEPENDENT_AMBULATORY_CARE_PROVIDER_SITE_OTHER): Payer: Medicare HMO | Admitting: Family Medicine

## 2019-05-29 ENCOUNTER — Encounter: Payer: Self-pay | Admitting: Family Medicine

## 2019-05-29 ENCOUNTER — Other Ambulatory Visit: Payer: Self-pay

## 2019-05-29 ENCOUNTER — Telehealth: Payer: Self-pay

## 2019-05-29 VITALS — BP 136/86 | HR 84 | Temp 97.7°F | Ht 70.0 in | Wt 220.2 lb

## 2019-05-29 DIAGNOSIS — E1165 Type 2 diabetes mellitus with hyperglycemia: Secondary | ICD-10-CM

## 2019-05-29 DIAGNOSIS — I10 Essential (primary) hypertension: Secondary | ICD-10-CM | POA: Diagnosis not present

## 2019-05-29 DIAGNOSIS — M545 Low back pain, unspecified: Secondary | ICD-10-CM

## 2019-05-29 DIAGNOSIS — G8929 Other chronic pain: Secondary | ICD-10-CM

## 2019-05-29 MED ORDER — OXYCODONE-ACETAMINOPHEN 10-325 MG PO TABS: ORAL_TABLET | ORAL | 0 refills | Status: DC

## 2019-05-29 NOTE — Patient Instructions (Addendum)
Call your eye doctor for an appointment.   Give [place] 2-3 business days to get the results of your labs back.   Keep the diet clean and stay active.  Let us know if you need anything.

## 2019-05-29 NOTE — Progress Notes (Signed)
Subjective:   Chief Complaint  Patient presents with  . Follow-up    Jeffrey Austin is a 59 y.o. male here for follow-up of diabetes.   Jeffrey Austin does not routinely monitor his sugars.  Patient does not require insulin.   Medications include: metformin 1000 mg bid, glipizide 5 mg bid Exercise: walking Diet is fair. Wife is cooking more since being home.   Hypertension Patient presents for hypertension follow up. He does monitor home blood pressures. He is compliant with medications. Patient has these side effects of medication: none Diet/exercise as above.   Past Medical History:  Diagnosis Date  . Back pain   . Diabetes mellitus without complication ([place])   . Hyperlipidemia   . Hypertension      Related testing: Date of retinal exam: Due Pneumovax: refuses  Review of Systems: Pulmonary:  No SOB Cardiovascular:  No chest pain  Objective:  BP 136/86 (BP Location: Left Arm, Patient Position: Sitting, Cuff Size: Normal)   Pulse 84   Temp 97.7 F (36.5 C) (Temporal)   Ht 5\' 10"  (1.778 m)   Wt 220 lb 4 oz (99.9 kg)   SpO2 98%   BMI 31.60 kg/m  General:  Well developed, well nourished, in no apparent distress Eyes:  Pupils equal and round, sclera anicteric without injection  Lungs:  CTAB, no access msc use Cardio:  RRR, no bruits, no LE edema Psych: Age appropriate judgment and insight  Assessment:   Type 2 diabetes mellitus with hyperglycemia, without long-term current use of insulin (HCC) - Plan: Hemoglobin A1c  Essential hypertension   Plan:   1- Counseled on diet and exercise. Cont meds. He will return for A1c as his covid vaccination is soon. 2- Cont meds.  F/u in 6 mo for CPE or prn. The patient voiced understanding and agreement to the plan.  [place], DO 05/29/19 12:34 PM

## 2019-05-29 NOTE — Telephone Encounter (Signed)
Patient called stating has been by pharmacy but has yet to receive a refill of his pain medication.  Last time prescribed according to PMP was:  Date   Meds       Amount provider 04/24/2019  Oxycodone-Acetaminophen 10-325  75  Eu Tho

## 2019-05-29 NOTE — Telephone Encounter (Signed)
PMP was reviewed. Oxycodone e-scribed today. Mr. Rall was called regarding the above, no answer. Left voicemail.

## 2019-06-02 ENCOUNTER — Encounter: Payer: Medicare HMO | Attending: Physical Medicine & Rehabilitation | Admitting: Registered Nurse

## 2019-06-02 ENCOUNTER — Other Ambulatory Visit: Payer: Self-pay

## 2019-06-02 ENCOUNTER — Encounter: Payer: Self-pay | Admitting: Registered Nurse

## 2019-06-02 VITALS — BP 159/109 | HR 104 | Temp 97.5°F | Ht 70.0 in | Wt 219.2 lb

## 2019-06-02 DIAGNOSIS — M545 Low back pain: Secondary | ICD-10-CM | POA: Insufficient documentation

## 2019-06-02 DIAGNOSIS — M5416 Radiculopathy, lumbar region: Secondary | ICD-10-CM | POA: Diagnosis not present

## 2019-06-02 DIAGNOSIS — I10 Essential (primary) hypertension: Secondary | ICD-10-CM

## 2019-06-02 DIAGNOSIS — G8929 Other chronic pain: Secondary | ICD-10-CM | POA: Diagnosis not present

## 2019-06-02 DIAGNOSIS — F17210 Nicotine dependence, cigarettes, uncomplicated: Secondary | ICD-10-CM | POA: Insufficient documentation

## 2019-06-02 DIAGNOSIS — M961 Postlaminectomy syndrome, not elsewhere classified: Secondary | ICD-10-CM

## 2019-06-02 DIAGNOSIS — G894 Chronic pain syndrome: Secondary | ICD-10-CM | POA: Diagnosis not present

## 2019-06-02 DIAGNOSIS — Z9889 Other specified postprocedural states: Secondary | ICD-10-CM | POA: Insufficient documentation

## 2019-06-02 DIAGNOSIS — Z981 Arthrodesis status: Secondary | ICD-10-CM | POA: Diagnosis not present

## 2019-06-02 DIAGNOSIS — Z5181 Encounter for therapeutic drug level monitoring: Secondary | ICD-10-CM | POA: Diagnosis not present

## 2019-06-02 DIAGNOSIS — Z79891 Long term (current) use of opiate analgesic: Secondary | ICD-10-CM | POA: Diagnosis not present

## 2019-06-02 DIAGNOSIS — E119 Type 2 diabetes mellitus without complications: Secondary | ICD-10-CM | POA: Diagnosis not present

## 2019-06-02 NOTE — Progress Notes (Signed)
Subjective:    Patient ID: Jeffrey Austin, male    DOB: [birth year], 59 y.o.   MRN: [id number]  HPI: Jeffrey Austin is a 59 y.o. male who returns for follow up appointment for chronic pain and medication refill. He states his pain is located in his lower back radiating into his bilateral lower extremities. He rates his pain 6. His current exercise regime is walking and performing yard work .  Arrived to office hypertensive, blood pressure was re-checked. [name] refuses ED or Urgent Care evaluation. He was instructed to keep a blood pressure log and F/U with his PCP he verbalizes understanding.   [name] Morphine equivalent is 45.00MME.  Oral Swab was Performed Today.    Pain Inventory Average Pain 7 Pain Right Now 6 My pain is sharp, burning and stabbing  In the last 24 hours, has pain interfered with the following? General activity 6 Relation with others 3 Enjoyment of life 5 What TIME of day is your pain at its worst? evening Sleep (in general) Fair  Pain is worse with: walking, bending and some activites Pain improves with: heat/ice and medication Relief from Meds: 5  Mobility how many minutes can you walk? 45 ability to climb steps?  yes do you drive?  no  Function disabled: date disabled 2016  Neuro/Psych No problems in this area  Prior Studies ,  Physicians involved in your care .   Family History  Problem Relation Age of Onset  . Leukemia Father   . Colon cancer Neg Hx   . Esophageal cancer Neg Hx   . Rectal cancer Neg Hx   . Stomach cancer Neg Hx    Social History   Socioeconomic History  . Marital status: Married    Spouse name: Not on file  . Number of children: Not on file  . Years of education: Not on file  . Highest education level: Not on file  Occupational History  . Not on file  Tobacco Use  . Smoking status: Current Some Day Smoker    Packs/day: 0.25    Types: Cigarettes  . Smokeless tobacco: Never Used  Substance  and Sexual Activity  . Alcohol use: Yes    Alcohol/week: 5.0 - 10.0 standard drinks    Types: 5 - 10 Standard drinks or equivalent per week  . Drug use: No  . Sexual activity: Yes  Other Topics Concern  . Not on file  Social History Narrative  . Not on file   Social Determinants of Health   Financial Resource Strain:   . Difficulty of Paying Living Expenses:   Food Insecurity:   . Worried About Charity fundraiser in the Last Year:   . Arboriculturist in the Last Year:   Transportation Needs:   . Film/video editor (Medical):   Marland Kitchen Lack of Transportation (Non-Medical):   Physical Activity:   . Days of Exercise per Week:   . Minutes of Exercise per Session:   Stress:   . Feeling of Stress :   Social Connections:   . Frequency of Communication with Friends and Family:   . Frequency of Social Gatherings with Friends and Family:   . Attends Religious Services:   . Active Member of Clubs or Organizations:   . Attends Archivist Meetings:   Marland Kitchen Marital Status:    Past Surgical History:  Procedure Laterality Date  . cyst removed from testicle     . INCISIONAL HERNIA REPAIR N/A  02/01/2016   Procedure: LAPAROSCOPIC INCISIONAL HERNIA REPAIR;  Surgeon: Arta Bruce Kinsinger, MD;  Location: [facility];  Service: General;  Laterality: N/A;  . INSERTION OF MESH N/A 02/01/2016   Procedure: INSERTION OF MESH;  Surgeon: Arta Bruce Kinsinger, MD;  Location: [facility];  Service: General;  Laterality: N/A;  . LUMBAR FUSION    . SPINAL FUSION     Past Medical History:  Diagnosis Date  . Back pain   . Diabetes mellitus without complication ([place])   . Hyperlipidemia   . Hypertension    Temp (!) 97.5 F (36.4 C)   Ht 5\' 10"  (1.778 m)   Wt 219 lb 3.2 oz (99.4 kg)   BMI 31.45 kg/m   Opioid Risk Score:   Fall Risk Score:  `1  Depression screen PHQ 2/9  Depression screen [facility] 2/9 12/19/2018 08/06/2018 12/09/2017 09/02/2017  Decreased Interest 0 0 0 0  Down, Depressed, Hopeless - 0 0 0   PHQ - 2 Score 0 0 0 0     Review of Systems  All other systems reviewed and are negative.      Objective:   Physical Exam Vitals and nursing note reviewed.  Constitutional:      Appearance: Normal appearance.  Cardiovascular:     Rate and Rhythm: Normal rate and regular rhythm.     Pulses: Normal pulses.     Heart sounds: Normal heart sounds.  Pulmonary:     Effort: Pulmonary effort is normal.     Breath sounds: Normal breath sounds.  Musculoskeletal:     Cervical back: Normal range of motion and neck supple.     Comments: Normal Muscle Bulk and Muscle Testing Reveals:  Upper Extremities: Full ROM and Muscle Strength 5/5 Lumbar Hypersensitivity Lower Extremities: Full ROM and Muscle Strength 5/5 Bilateral lower extremities flexion produces pain into his bilateral lower extremities Arises from chair with ease Narrow Based  Gait   Skin:    General: Skin is warm and dry.  Neurological:     Mental Status: He is alert and oriented to person, place, and time.  Psychiatric:        Mood and Affect: Mood normal.        Behavior: Behavior normal.           Assessment & Plan:  1.Post Laminectomy Syndrome: Continue current medication regimen and HEP as tolerated.06/02/2018. 2. Chronic Lumbar Radiculopathy:S/P Lumbar Radiofrequency with Dr. Ernestina Patches on 12/24/2017, with relief noted.Awaiting on insurance approval for Lumbar Radiofrequency he reports.Continue to Monitor.06/02/2019. 3. Chronic pain Syndrome: Refilled:Oxycodone 10/325 mg one tablet every8hours as needed for moderate pain.#75.06/02/2019. 4.BilateralGreater Trochanteric Bursitis:No Complaints today.Continue to Alternate Ice and Heat Therapy.Continue to Monitor.06/02/2019 5. Sacroilitis: S/P SI Injection by Dr. Ernestina Patches on 09/02/2018. With relief noted.Continue to Monitor. 06/02/2019. 6. Uncontrolled Hypertension: He reports he is compliant with medication. Refuses Ed or Urgent Care Evaluation. He will  keep a blood pressure Log, call his PCP with Blood Pressure Readings.   32minutes of face to face patient care time was spent during this visit. All questions were encouraged and answered.  F/U in 1 month

## 2019-06-05 ENCOUNTER — Other Ambulatory Visit: Payer: Self-pay | Admitting: Family Medicine

## 2019-06-05 DIAGNOSIS — E119 Type 2 diabetes mellitus without complications: Secondary | ICD-10-CM

## 2019-06-05 LAB — DRUG TOX MONITOR 1 W/CONF, ORAL FLD
Amphetamines: NEGATIVE ng/mL (ref <10)
Barbiturates: NEGATIVE ng/mL (ref <10)
Benzodiazepines: NEGATIVE ng/mL (ref <0.50)
Buprenorphine: NEGATIVE ng/mL (ref <0.10)
Cocaine: NEGATIVE ng/mL (ref <5.0)
Codeine: NEGATIVE ng/mL (ref <2.5)
Dihydrocodeine: NEGATIVE ng/mL (ref <2.5)
Fentanyl: NEGATIVE ng/mL (ref <0.10)
Heroin Metabolite: NEGATIVE ng/mL (ref <1.0)
Hydrocodone: NEGATIVE ng/mL (ref <2.5)
Hydromorphone: NEGATIVE ng/mL (ref <2.5)
MARIJUANA: NEGATIVE ng/mL (ref <2.5)
MDMA: NEGATIVE ng/mL (ref <10)
Meprobamate: NEGATIVE ng/mL (ref <2.5)
Methadone: NEGATIVE ng/mL (ref <5.0)
Morphine: NEGATIVE ng/mL (ref <2.5)
Nicotine Metabolite: NEGATIVE ng/mL (ref <5.0)
Norhydrocodone: NEGATIVE ng/mL (ref <2.5)
Noroxycodone: 15.7 ng/mL — ABNORMAL HIGH (ref <2.5)
Opiates: POSITIVE ng/mL — AB (ref <2.5)
Oxycodone: 246.6 ng/mL — ABNORMAL HIGH (ref <2.5)
Oxymorphone: NEGATIVE ng/mL (ref <2.5)
Phencyclidine: NEGATIVE ng/mL (ref <10)
Tapentadol: NEGATIVE ng/mL (ref <5.0)
Tramadol: NEGATIVE ng/mL (ref <5.0)
Zolpidem: NEGATIVE ng/mL (ref <5.0)

## 2019-06-05 LAB — DRUG TOX ALC METAB W/CON, ORAL FLD
Alcohol Metabolite: POSITIVE ng/mL — AB (ref <25)
Ethyl Sulfate: 115 ng/mL — ABNORMAL HIGH (ref <25)

## 2019-06-08 ENCOUNTER — Telehealth: Payer: Self-pay | Admitting: *Deleted

## 2019-06-08 NOTE — Telephone Encounter (Signed)
Oral swab drug screen was consistent for prescribed medications. It is also positive for alcohol. This is his first occurrence.

## 2019-06-09 ENCOUNTER — Telehealth: Payer: Self-pay

## 2019-06-09 NOTE — Telephone Encounter (Signed)
UDS RESULTS   NOT  CONSISTENT WITH MEDICATIONS ON FILE ETHYL SULFATE PRESENT

## 2019-06-14 ENCOUNTER — Other Ambulatory Visit: Payer: Self-pay | Admitting: Family Medicine

## 2019-07-03 ENCOUNTER — Other Ambulatory Visit: Payer: Self-pay

## 2019-07-03 ENCOUNTER — Other Ambulatory Visit: Payer: Self-pay | Admitting: Family Medicine

## 2019-07-03 ENCOUNTER — Encounter: Payer: Self-pay | Admitting: Registered Nurse

## 2019-07-03 ENCOUNTER — Encounter: Payer: Medicare HMO | Attending: Physical Medicine & Rehabilitation | Admitting: Registered Nurse

## 2019-07-03 VITALS — BP 176/111 | HR 100 | Temp 98.2°F | Ht 70.0 in | Wt 221.0 lb

## 2019-07-03 DIAGNOSIS — Z79891 Long term (current) use of opiate analgesic: Secondary | ICD-10-CM | POA: Insufficient documentation

## 2019-07-03 DIAGNOSIS — I10 Essential (primary) hypertension: Secondary | ICD-10-CM | POA: Insufficient documentation

## 2019-07-03 DIAGNOSIS — G894 Chronic pain syndrome: Secondary | ICD-10-CM | POA: Insufficient documentation

## 2019-07-03 DIAGNOSIS — Z5181 Encounter for therapeutic drug level monitoring: Secondary | ICD-10-CM | POA: Insufficient documentation

## 2019-07-03 DIAGNOSIS — Z981 Arthrodesis status: Secondary | ICD-10-CM | POA: Insufficient documentation

## 2019-07-03 DIAGNOSIS — E119 Type 2 diabetes mellitus without complications: Secondary | ICD-10-CM | POA: Diagnosis not present

## 2019-07-03 DIAGNOSIS — G8929 Other chronic pain: Secondary | ICD-10-CM | POA: Diagnosis not present

## 2019-07-03 DIAGNOSIS — F17210 Nicotine dependence, cigarettes, uncomplicated: Secondary | ICD-10-CM | POA: Diagnosis not present

## 2019-07-03 DIAGNOSIS — M545 Low back pain: Secondary | ICD-10-CM | POA: Insufficient documentation

## 2019-07-03 DIAGNOSIS — M961 Postlaminectomy syndrome, not elsewhere classified: Secondary | ICD-10-CM | POA: Diagnosis not present

## 2019-07-03 DIAGNOSIS — M5416 Radiculopathy, lumbar region: Secondary | ICD-10-CM | POA: Diagnosis not present

## 2019-07-03 DIAGNOSIS — Z9889 Other specified postprocedural states: Secondary | ICD-10-CM | POA: Insufficient documentation

## 2019-07-03 MED ORDER — OXYCODONE-ACETAMINOPHEN 10-325 MG PO TABS: ORAL_TABLET | ORAL | 0 refills | Status: DC

## 2019-07-03 NOTE — Progress Notes (Signed)
Subjective:    Patient ID: Jeffrey Austin, male    DOB: 1960-06-06, 59 y.o.   MRN: [id number]  HPI: Jeffrey Austin is a 59 y.o. male who returns for follow up appointment for chronic pain and medication refill. He states his pain is located in his lower back radiating into his bilateral lower extremities. He rates  His pain 6. His current exercise regime is walking and performing stretching exercises.   Jeffrey Austin arrived hypertensive, he states "he was almost in a MVC on his way to his appointment". Blood pressure was re-checked. He refuses ED or Urgent Care evaluation. He was instructed to keep blood pressure log and follow up with his PCP, he verbalizes understanding. He is compliant with his anti-hypertensive medication. I explained to Jeffrey Austin he came to office last month and this month with uncontrolled hypertension, he states he checks his blood pressure at home and it runs in the 130's. Re-iterated to keep a blood pressure log and follow up with his PCP, he verbalizes understanding. He will call office today with a blood pressure reading he states.   Jeffrey Austin equivalent is 45.00 MME. Jeffrey Austin reports the desire to be weaned off his oxycodone, he was given a taper schedule. He will call office with update, he verbalizes understanding.    Last Oral Swab was performed on 06/02/2019, it was consistent.   Pain Inventory Average Pain 7 Pain Right Now 6 My pain is burning, stabbing, tingling and aching  In the last 24 hours, has pain interfered with the following? General activity 5 Relation with others 3 Enjoyment of life 3 What TIME of day is your pain at its worst? morning and evening Sleep (in general) Fair  Pain is worse with: bending, sitting and inactivity Pain improves with: heat/ice and medication Relief from Meds: 5  Mobility Do you have any goals in this area?  no  Function Do you have any goals in this area?  no  Neuro/Psych No problems in  this area  Prior Studies Any changes since last visit?  no  Physicians involved in your care Any changes since last visit?  no   Family History  Problem Relation Age of Onset  . Leukemia Father   . Colon cancer Neg Hx   . Esophageal cancer Neg Hx   . Rectal cancer Neg Hx   . Stomach cancer Neg Hx    Social History   Socioeconomic History  . Marital status: Married    Spouse name: Not on file  . Number of children: Not on file  . Years of education: Not on file  . Highest education level: Not on file  Occupational History  . Not on file  Tobacco Use  . Smoking status: Current Some Day Smoker    Packs/day: 0.25    Types: Cigarettes  . Smokeless tobacco: Never Used  Substance and Sexual Activity  . Alcohol use: Yes    Alcohol/week: 5.0 - 10.0 standard drinks    Types: 5 - 10 Standard drinks or equivalent per week  . Drug use: No  . Sexual activity: Yes  Other Topics Concern  . Not on file  Social History Narrative  . Not on file   Social Determinants of Health   Financial Resource Strain:   . Difficulty of Paying Living Expenses:   Food Insecurity:   . Worried About Charity fundraiser in the Last Year:   . [place] in the Last Year:  Transportation Needs:   . Film/video editor (Medical):   Marland Kitchen Lack of Transportation (Non-Medical):   Physical Activity:   . Days of Exercise per Week:   . Minutes of Exercise per Session:   Stress:   . Feeling of Stress :   Social Connections:   . Frequency of Communication with Friends and Family:   . Frequency of Social Gatherings with Friends and Family:   . Attends Religious Services:   . Active Member of Clubs or Organizations:   . Attends Archivist Meetings:   Marland Kitchen Marital Status:    Past Surgical History:  Procedure Laterality Date  . cyst removed from testicle     . INCISIONAL HERNIA REPAIR N/A 02/01/2016   Procedure: LAPAROSCOPIC INCISIONAL HERNIA REPAIR;  Surgeon: Arta Bruce Kinsinger, MD;   Location: [facility];  Service: General;  Laterality: N/A;  . INSERTION OF MESH N/A 02/01/2016   Procedure: INSERTION OF MESH;  Surgeon: Arta Bruce Kinsinger, MD;  Location: [facility];  Service: General;  Laterality: N/A;  . LUMBAR FUSION    . SPINAL FUSION     Past Medical History:  Diagnosis Date  . Back pain   . Diabetes mellitus without complication ([place])   . Hyperlipidemia   . Hypertension    BP (!) 154/103   Pulse (!) 101   Temp 98.2 F (36.8 C)   Ht 5\' 10"  (1.778 m)   Wt 221 lb (100.2 kg)   SpO2 98%   BMI 31.71 kg/m   Opioid Risk Score:   Fall Risk Score:  `1  Depression screen PHQ 2/9  Depression screen [facility] 2/9 12/19/2018 08/06/2018 12/09/2017 09/02/2017  Decreased Interest 0 0 0 0  Down, Depressed, Hopeless - 0 0 0  PHQ - 2 Score 0 0 0 0     Review of Systems     Objective:   Physical Exam Vitals and nursing note reviewed.  Constitutional:      Appearance: Normal appearance.  Cardiovascular:     Rate and Rhythm: Normal rate and regular rhythm.     Pulses: Normal pulses.     Heart sounds: Normal heart sounds.  Pulmonary:     Effort: Pulmonary effort is normal.     Breath sounds: Normal breath sounds.  Musculoskeletal:     Cervical back: Normal range of motion and neck supple.     Comments: Normal Muscle Bulk and Muscle Testing Reveals:  Upper Extremities: Full ROM and Muscle Strength 5/5 Lumbar Hypersensitivity Lower Extremities: Full ROM and Muscle Strength 5/5 Arises from chair with ease Narrow Based  Gait   Skin:    General: Skin is warm and dry.  Neurological:     Mental Status: He is alert and oriented to person, place, and time.  Psychiatric:        Mood and Affect: Mood normal.        Behavior: Behavior normal.           Assessment & Plan:  1.Post Laminectomy Syndrome: Continue current medication regimen and HEP as tolerated.07/03/2018. 2. Chronic Lumbar Radiculopathy:S/P Lumbar Radiofrequency with Dr. Ernestina Patches on 12/24/2017, with relief  noted.Awaiting on insurance approval for Lumbar Radiofrequency he reports.Continue to Monitor.07/03/2019. 3. Chronic pain Syndrome: Refilled:Oxycodone 10/325 mg one tablet every8hours as needed for moderate pain.#75.07/03/2019. 4.BilateralGreater Trochanteric Bursitis:No Complaints today.Continue to Alternate Ice and Heat Therapy.Continue to Monitor.07/03/2019 5. Sacroilitis: S/P SI Injection by Dr. Ernestina Patches on 09/02/2018. With relief noted.Continue to Monitor.07/03/2019. 6. Uncontrolled Hypertension: He reports he's compliant with  medication. Refuses Ed or Urgent Care Evaluation. He will keep a blood pressure Log, call his PCP with Blood Pressure Readings. 07/03/2019  85minutes of face to face patient care time was spent during this visit. All questions were encouraged and answered.  F/U in 1 month

## 2019-08-04 ENCOUNTER — Encounter: Payer: Self-pay | Admitting: Registered Nurse

## 2019-08-04 ENCOUNTER — Encounter: Payer: Medicare HMO | Attending: Physical Medicine & Rehabilitation | Admitting: Registered Nurse

## 2019-08-04 ENCOUNTER — Other Ambulatory Visit: Payer: Self-pay

## 2019-08-04 VITALS — BP 180/108 | HR 89 | Temp 98.7°F | Ht 70.0 in | Wt 221.0 lb

## 2019-08-04 DIAGNOSIS — M5416 Radiculopathy, lumbar region: Secondary | ICD-10-CM | POA: Diagnosis not present

## 2019-08-04 DIAGNOSIS — Z981 Arthrodesis status: Secondary | ICD-10-CM | POA: Insufficient documentation

## 2019-08-04 DIAGNOSIS — G8929 Other chronic pain: Secondary | ICD-10-CM | POA: Insufficient documentation

## 2019-08-04 DIAGNOSIS — E119 Type 2 diabetes mellitus without complications: Secondary | ICD-10-CM | POA: Diagnosis not present

## 2019-08-04 DIAGNOSIS — G894 Chronic pain syndrome: Secondary | ICD-10-CM | POA: Diagnosis not present

## 2019-08-04 DIAGNOSIS — M545 Low back pain: Secondary | ICD-10-CM | POA: Insufficient documentation

## 2019-08-04 DIAGNOSIS — Z9889 Other specified postprocedural states: Secondary | ICD-10-CM | POA: Diagnosis not present

## 2019-08-04 DIAGNOSIS — F17210 Nicotine dependence, cigarettes, uncomplicated: Secondary | ICD-10-CM | POA: Insufficient documentation

## 2019-08-04 DIAGNOSIS — Z5181 Encounter for therapeutic drug level monitoring: Secondary | ICD-10-CM | POA: Diagnosis not present

## 2019-08-04 DIAGNOSIS — I10 Essential (primary) hypertension: Secondary | ICD-10-CM

## 2019-08-04 DIAGNOSIS — Z79891 Long term (current) use of opiate analgesic: Secondary | ICD-10-CM | POA: Diagnosis not present

## 2019-08-04 DIAGNOSIS — M961 Postlaminectomy syndrome, not elsewhere classified: Secondary | ICD-10-CM | POA: Diagnosis not present

## 2019-08-04 MED ORDER — OXYCODONE-ACETAMINOPHEN 10-325 MG PO TABS: ORAL_TABLET | ORAL | 0 refills | Status: DC

## 2019-08-04 NOTE — Progress Notes (Signed)
Subjective:    Patient ID: Jeffrey Austin, male    DOB: September 06, 1960, 59 y.o.   MRN: [id number]  HPI: Jeffrey Austin is a 59 y.o. male who returns for follow up appointment for chronic pain and medication refill. He states his pain is located in his lower back radiating into his bilateral lower extremities. He rates his pain 5. His current exercise regime is walking and performing stretching exercises.  Jeffrey Austin arrived hypertensive, he reports he is compliant with his medication. He had a picture of his blood pressure reading at his home this morning, in 130's/80's.He refuses ED, Urgent care or emergency room evaluation. He states he will go home and check his blood pressure and call office with reading. He was instructed to call PCP. He verbalizes understanding.   Jeffrey Austin Morphine equivalent is 45.00  MME.    Last Oral Swab was Performed on 06/02/2019, consistent for Oxycodone.    Pain Inventory Average Pain 7 Pain Right Now 5 My pain is burning, stabbing, tingling and aching  In the last 24 hours, has pain interfered with the following? General activity 5 Relation with others 3 Enjoyment of life 5 What TIME of day is your pain at its worst? morning and evening Sleep (in general) Fair  Pain is worse with: bending, sitting and inactivity Pain improves with: heat/ice and medication Relief from Meds: 5  Mobility Do you have any goals in this area?  no  Function Do you have any goals in this area?  no  Neuro/Psych No problems in this area  Prior Studies Any changes since last visit?  no  Physicians involved in your care Any changes since last visit?  no   Family History  Problem Relation Age of Onset  . Leukemia Father   . Colon cancer Neg Hx   . Esophageal cancer Neg Hx   . Rectal cancer Neg Hx   . Stomach cancer Neg Hx    Social History   Socioeconomic History  . Marital status: Married    Spouse name: Not on file  . Number of children: Not on  file  . Years of education: Not on file  . Highest education level: Not on file  Occupational History  . Not on file  Tobacco Use  . Smoking status: Current Some Day Smoker    Packs/day: 0.25    Types: Cigarettes  . Smokeless tobacco: Never Used  Substance and Sexual Activity  . Alcohol use: Yes    Alcohol/week: 5.0 - 10.0 standard drinks    Types: 5 - 10 Standard drinks or equivalent per week  . Drug use: No  . Sexual activity: Yes  Other Topics Concern  . Not on file  Social History Narrative  . Not on file   Social Determinants of Health   Financial Resource Strain:   . Difficulty of Paying Living Expenses:   Food Insecurity:   . Worried About Charity fundraiser in the Last Year:   . Arboriculturist in the Last Year:   Transportation Needs:   . Film/video editor (Medical):   Marland Kitchen Lack of Transportation (Non-Medical):   Physical Activity:   . Days of Exercise per Week:   . Minutes of Exercise per Session:   Stress:   . Feeling of Stress :   Social Connections:   . Frequency of Communication with Friends and Family:   . Frequency of Social Gatherings with Friends and Family:   . Attends Religious  Services:   . Active Member of Clubs or Organizations:   . Attends Archivist Meetings:   Marland Kitchen Marital Status:    Past Surgical History:  Procedure Laterality Date  . cyst removed from testicle     . INCISIONAL HERNIA REPAIR N/A 02/01/2016   Procedure: LAPAROSCOPIC INCISIONAL HERNIA REPAIR;  Surgeon: Arta Bruce Kinsinger, MD;  Location: [facility];  Service: General;  Laterality: N/A;  . INSERTION OF MESH N/A 02/01/2016   Procedure: INSERTION OF MESH;  Surgeon: Arta Bruce Kinsinger, MD;  Location: [facility];  Service: General;  Laterality: N/A;  . LUMBAR FUSION    . SPINAL FUSION     Past Medical History:  Diagnosis Date  . Back pain   . Diabetes mellitus without complication ([place])   . Hyperlipidemia   . Hypertension    BP (!) 175/106   Pulse 94   Temp 98.7  F (37.1 C)   Ht 5\' 10"  (1.778 m)   Wt 221 lb (100.2 kg)   SpO2 98%   BMI 31.71 kg/m   Opioid Risk Score:   Fall Risk Score:  `1  Depression screen PHQ 2/9  Depression screen [facility] 2/9 12/19/2018 08/06/2018 12/09/2017 09/02/2017  Decreased Interest 0 0 0 0  Down, Depressed, Hopeless - 0 0 0  PHQ - 2 Score 0 0 0 0     Review of Systems  All other systems reviewed and are negative.      Objective:   Physical Exam Vitals and nursing note reviewed.  Constitutional:      Appearance: Normal appearance.  Cardiovascular:     Rate and Rhythm: Normal rate and regular rhythm.     Pulses: Normal pulses.     Heart sounds: Normal heart sounds.  Pulmonary:     Effort: Pulmonary effort is normal.     Breath sounds: Normal breath sounds.  Musculoskeletal:     Cervical back: Normal range of motion and neck supple.     Comments: Normal Muscle Bulk and Muscle Testing Reveals:  Upper Extremities: Full ROM and Muscle Strength 5/5 Lumbar Hypersensitivity Lower Extremities: Full ROM and Muscle Strength 5/5 Arises from chair slowly  Antalgic  Gait   Skin:    General: Skin is warm and dry.  Neurological:     Mental Status: He is alert and oriented to person, place, and time.  Psychiatric:        Mood and Affect: Mood normal.        Behavior: Behavior normal.           Assessment & Plan:  1.Post Laminectomy Syndrome: Continue current medication regimen and HEP as tolerated.08/04/2019. 2. Chronic Lumbar Radiculopathy:S/P Lumbar Radiofrequency with Dr. Ernestina Patches on 12/24/2017, with relief noted.Awaiting on insurance approval for Lumbar Radiofrequency he reports.Continue to Monitor.08/04/2019. 3. Chronic pain Syndrome: Refilled:Oxycodone 10/325 mg one tablet every8hours as needed for moderate pain.#75.08/04/2019. 4.BilateralGreater Trochanteric Bursitis:No Complaints today.Continue to Alternate Ice and Heat Therapy.Continue to Monitor.08/04/2019 5. Sacroilitis: S/P SI Injection  by Dr. Ernestina Patches on 09/02/2018. With relief noted.Continue to Monitor.08/04/2019. 6. Uncontrolled Hypertension: He reports he's compliant with medication. Refuses Ed or Urgent Care Evaluation. He will keep a blood pressure Log, callhisPCP with Blood Pressure Readings.08/04/2019  29minutes of face to face patient care time was spent during this visit. All questions were encouraged and answered.  F/U in 1 month

## 2019-08-18 ENCOUNTER — Encounter: Payer: Self-pay | Admitting: Family Medicine

## 2019-08-18 ENCOUNTER — Ambulatory Visit (INDEPENDENT_AMBULATORY_CARE_PROVIDER_SITE_OTHER): Payer: Medicare HMO | Admitting: Family Medicine

## 2019-08-18 ENCOUNTER — Other Ambulatory Visit: Payer: Self-pay

## 2019-08-18 VITALS — BP 140/84 | HR 86 | Temp 96.0°F | Ht 70.0 in | Wt 218.0 lb

## 2019-08-18 DIAGNOSIS — Z72820 Sleep deprivation: Secondary | ICD-10-CM

## 2019-08-18 MED ORDER — BETAMETHASONE VALERATE 0.1 % EX LOTN: 1.0000 "application " | TOPICAL_LOTION | Freq: Two times a day (BID) | CUTANEOUS | 0 refills | Status: DC

## 2019-08-18 MED ORDER — TRAZODONE HCL 50 MG PO TABS: 25.0000 mg | ORAL_TABLET | Freq: Every evening | ORAL | 3 refills | Status: DC | PRN

## 2019-08-18 MED FILL — traZODone HCL 50 MG TABS: 50 | 30 days supply | Qty: 30 | Fill #0

## 2019-08-18 MED FILL — BETAMETHASONE VA 0.1% LOT: 0.1 | 30 days supply | Qty: 60 | Fill #0

## 2019-08-18 NOTE — Patient Instructions (Addendum)
The best thing would be to get your dog off the bed.  Sleep is important to [place] all. Getting good sleep is imperative to adequate functioning during the day. Work with our counselors who are trained to help people obtain quality sleep. Call [phone] to schedule an appointment or if you are curious about insurance coverage/cost.   Continue with your exercise.  Let us know if you need anything.

## 2019-08-18 NOTE — Progress Notes (Signed)
Chief Complaint  Patient presents with  . Insomnia    Subjective: Patient is a 59 y.o. male here for insomnia.  2 mo of trouble sleeping. Has an aging pug that sleeps on his bed. Every time she moves, he jerks awake. This happens around every hour. Feels that it is getting worse. He is exercising 90-120 min/d. No concerns for sleep apnea.   Past Medical History:  Diagnosis Date  . Back pain   . Diabetes mellitus without complication ([place])   . Hyperlipidemia   . Hypertension     Objective: BP 140/84 (BP Location: Left Arm, Patient Position: Sitting, Cuff Size: Normal)   Pulse 86   Temp (!) 96 F (35.6 C) (Temporal)   Ht 5\' 10"  (1.778 m)   Wt 218 lb (98.9 kg)   SpO2 100%   BMI 31.28 kg/m  General: Awake, appears stated age Lungs: No accessory muscle use Psych: Age appropriate judgment and insight, normal affect and mood  Assessment and Plan: Poor sleep - Plan: traZODone (DESYREL) 50 MG tablet  Start prn trazodone. [facility] info provided. He knows that the underlying issue is getting his dog off the bed. He states this is not an option and does not expect miracles.  F/u in 1 mo if no better. The patient voiced understanding and agreement to the plan.  [place], DO 08/18/19  1:44 PM

## 2019-08-20 ENCOUNTER — Other Ambulatory Visit: Payer: Self-pay | Admitting: Family Medicine

## 2019-08-20 DIAGNOSIS — E119 Type 2 diabetes mellitus without complications: Secondary | ICD-10-CM

## 2019-09-08 ENCOUNTER — Other Ambulatory Visit: Payer: Self-pay

## 2019-09-08 ENCOUNTER — Encounter: Payer: Self-pay | Admitting: Registered Nurse

## 2019-09-08 ENCOUNTER — Encounter: Payer: Medicare HMO | Attending: Physical Medicine & Rehabilitation | Admitting: Registered Nurse

## 2019-09-08 VITALS — BP 145/95 | HR 80 | Temp 98.2°F | Ht 70.0 in | Wt 221.0 lb

## 2019-09-08 DIAGNOSIS — I10 Essential (primary) hypertension: Secondary | ICD-10-CM | POA: Insufficient documentation

## 2019-09-08 DIAGNOSIS — Z9889 Other specified postprocedural states: Secondary | ICD-10-CM | POA: Diagnosis not present

## 2019-09-08 DIAGNOSIS — M533 Sacrococcygeal disorders, not elsewhere classified: Secondary | ICD-10-CM | POA: Diagnosis not present

## 2019-09-08 DIAGNOSIS — Z79891 Long term (current) use of opiate analgesic: Secondary | ICD-10-CM | POA: Insufficient documentation

## 2019-09-08 DIAGNOSIS — G8929 Other chronic pain: Secondary | ICD-10-CM

## 2019-09-08 DIAGNOSIS — M961 Postlaminectomy syndrome, not elsewhere classified: Secondary | ICD-10-CM | POA: Insufficient documentation

## 2019-09-08 DIAGNOSIS — F17210 Nicotine dependence, cigarettes, uncomplicated: Secondary | ICD-10-CM | POA: Insufficient documentation

## 2019-09-08 DIAGNOSIS — M545 Low back pain: Secondary | ICD-10-CM | POA: Insufficient documentation

## 2019-09-08 DIAGNOSIS — M5416 Radiculopathy, lumbar region: Secondary | ICD-10-CM | POA: Diagnosis not present

## 2019-09-08 DIAGNOSIS — G894 Chronic pain syndrome: Secondary | ICD-10-CM | POA: Insufficient documentation

## 2019-09-08 DIAGNOSIS — Z981 Arthrodesis status: Secondary | ICD-10-CM | POA: Insufficient documentation

## 2019-09-08 DIAGNOSIS — E119 Type 2 diabetes mellitus without complications: Secondary | ICD-10-CM | POA: Insufficient documentation

## 2019-09-08 DIAGNOSIS — Z5181 Encounter for therapeutic drug level monitoring: Secondary | ICD-10-CM | POA: Diagnosis not present

## 2019-09-08 DIAGNOSIS — M7061 Trochanteric bursitis, right hip: Secondary | ICD-10-CM | POA: Diagnosis not present

## 2019-09-08 DIAGNOSIS — M7062 Trochanteric bursitis, left hip: Secondary | ICD-10-CM

## 2019-09-08 MED ORDER — OXYCODONE-ACETAMINOPHEN 10-325 MG PO TABS: ORAL_TABLET | ORAL | 0 refills | Status: DC

## 2019-09-08 NOTE — Progress Notes (Signed)
Subjective:    Patient ID: Jeffrey Austin, male    DOB: May 08, 1960, 59 y.o.   MRN: [id number]  HPI: Jeffrey Austin is a 59 y.o. male who returns for follow up appointment for chronic pain and medication refill. He states his pain is located in his lower back radiating into his bilateral lower extremities R>L. Also reports bilateral hip pain. [name] states he is having increase intensity of lower back radicular pain and requesting an injection. This was discussed with Dr Letta Pate, he will be scheduled for right sacroiliac injection with Dr Letta Pate, he verbalizes understanding. He rates his pain 7. His current exercise regime is walking and performing stretching exercises.  [name] Morphine equivalent is 45.00 MME.  Last Oral Swab was Performed on 06/02/2019, it was consistent.    Pain Inventory Average Pain 7 Pain Right Now 7 My pain is constant, sharp, burning, stabbing and aching  In the last 24 hours, has pain interfered with the following? General activity 5 Relation with others 0 Enjoyment of life 2 What TIME of day is your pain at its worst? all Sleep (in general) NA  Pain is worse with: bending, sitting and inactivity Pain improves with: heat/ice, therapy/exercise and medication Relief from Meds: 5  Mobility walk without assistance Do you have any goals in this area?  no  Function retired Do you have any goals in this area?  no  Neuro/Psych No problems in this area  Prior Studies Any changes since last visit?  no  Physicians involved in your care Any changes since last visit?  no   Family History  Problem Relation Age of Onset  . Leukemia Father   . Colon cancer Neg Hx   . Esophageal cancer Neg Hx   . Rectal cancer Neg Hx   . Stomach cancer Neg Hx    Social History   Socioeconomic History  . Marital status: Married    Spouse name: Not on file  . Number of children: Not on file  . Years of education: Not on file  . Highest education  level: Not on file  Occupational History  . Not on file  Tobacco Use  . Smoking status: Current Some Day Smoker    Packs/day: 0.25    Types: Cigarettes  . Smokeless tobacco: Never Used  Vaping Use  . Vaping Use: Never used  Substance and Sexual Activity  . Alcohol use: Yes    Alcohol/week: 5.0 - 10.0 standard drinks    Types: 5 - 10 Standard drinks or equivalent per week  . Drug use: No  . Sexual activity: Yes  Other Topics Concern  . Not on file  Social History Narrative  . Not on file   Social Determinants of Health   Financial Resource Strain:   . Difficulty of Paying Living Expenses:   Food Insecurity:   . Worried About Charity fundraiser in the Last Year:   . Arboriculturist in the Last Year:   Transportation Needs:   . Film/video editor (Medical):   Marland Kitchen Lack of Transportation (Non-Medical):   Physical Activity:   . Days of Exercise per Week:   . Minutes of Exercise per Session:   Stress:   . Feeling of Stress :   Social Connections:   . Frequency of Communication with Friends and Family:   . Frequency of Social Gatherings with Friends and Family:   . Attends Religious Services:   . Active Member of Clubs or Organizations:   .  Attends Archivist Meetings:   Marland Kitchen Marital Status:    Past Surgical History:  Procedure Laterality Date  . cyst removed from testicle     . INCISIONAL HERNIA REPAIR N/A 02/01/2016   Procedure: LAPAROSCOPIC INCISIONAL HERNIA REPAIR;  Surgeon: Arta Bruce Kinsinger, MD;  [facility];  Service: General;  Laterality: N/A;  . INSERTION OF MESH N/A 02/01/2016   Procedure: INSERTION OF MESH;  Surgeon: Arta Bruce Kinsinger, MD;  [facility]: [facility];  Service: General;  Laterality: N/A;  . LUMBAR FUSION    . SPINAL FUSION     Past Medical History:  Diagnosis Date  . Back pain   . Diabetes mellitus without complication ([place])   . Hyperlipidemia   . Hypertension    BP (!) 145/95   Pulse 80   Temp 98.2 F (36.8 C)   Ht 5'  10" (1.778 m)   Wt 221 lb (100.2 kg)   SpO2 98%   BMI 31.71 kg/m   Opioid Risk Score:   Fall Risk Score:  `1  Depression screen PHQ 2/9  Depression screen [facility] 2/9 12/19/2018 08/06/2018 12/09/2017 09/02/2017  Decreased Interest 0 0 0 0  Down, Depressed, Hopeless - 0 0 0  PHQ - 2 Score 0 0 0 0    Review of Systems  Constitutional: Negative.   HENT: Negative.   Eyes: Negative.   Respiratory: Negative.   Cardiovascular: Negative.   Gastrointestinal: Negative.   Endocrine: Negative.   Genitourinary: Negative.   Musculoskeletal: Positive for back pain.  Skin: Negative.   Allergic/Immunologic: Negative.   Neurological: Negative.   Hematological: Negative.   Psychiatric/Behavioral: Negative.   All other systems reviewed and are negative.      Objective:   Physical Exam Vitals and nursing note reviewed.  Constitutional:      Appearance: Normal appearance.  Cardiovascular:     Rate and Rhythm: Normal rate and regular rhythm.     Pulses: Normal pulses.     Heart sounds: Normal heart sounds.  Pulmonary:     Effort: Pulmonary effort is normal.     Breath sounds: Normal breath sounds.  Musculoskeletal:     Cervical back: Normal range of motion and neck supple.     Comments: Normal Muscle Bulk and Muscle Testing Reveals:  Upper Extremities: Full ROM and Muscle Strength 5/5 Lumbar Hypersensitivity Bilateral Greater Trochanter Tenderness Lower Extremities: Decreased ROM and Muscle Strength 5/5 Bilateral Lower Extremity Flexion Produces Pain into his Lumbar Arises from chair slowly Antalgic Gait   Skin:    General: Skin is warm and dry.  Neurological:     Mental Status: He is alert and oriented to person, place, and time.  Psychiatric:        Mood and Affect: Mood normal.        Behavior: Behavior normal.           Assessment & Plan:  1.Post Laminectomy Syndrome: Continue current medication regimen and HEP as tolerated.09/08/2019. 2. Chronic Lumbar  Radiculopathy:S/P Lumbar Radiofrequency with Dr. Ernestina Patches on 12/24/2017, with relief noted.Continue to Monitor.09/08/2019. 3. Chronic pain Syndrome: Refilled:Oxycodone 10/325 mg one tablet every8hours as needed for moderate pain.#75.09/08/2019. 4.BilateralGreater Trochanteric Bursitis:Continue to Alternate Ice and Heat Therapy.Continue to Monitor.09/08/2019 5. Chronic Right Sacroiliac Pain: Scheduled for  Right Sacroiliac injection with Dr Letta Pate. : Continue to Monitor.09/08/2019.  33minutes of face to face patient care time was spent during this visit. All questions were encouraged and answered.  F/U in 1 month

## 2019-09-28 MED FILL — traZODone HCL 50 MG TABS: 50 | 30 days supply | Qty: 30 | Fill #1

## 2019-09-29 ENCOUNTER — Encounter: Payer: Self-pay | Admitting: Physical Medicine & Rehabilitation

## 2019-09-29 ENCOUNTER — Encounter: Payer: Medicare HMO | Admitting: Physical Medicine & Rehabilitation

## 2019-09-29 ENCOUNTER — Other Ambulatory Visit: Payer: Self-pay

## 2019-09-29 VITALS — BP 153/101 | HR 90 | Temp 98.4°F | Ht 70.0 in | Wt 218.2 lb

## 2019-09-29 DIAGNOSIS — Z981 Arthrodesis status: Secondary | ICD-10-CM | POA: Diagnosis not present

## 2019-09-29 DIAGNOSIS — G894 Chronic pain syndrome: Secondary | ICD-10-CM | POA: Diagnosis not present

## 2019-09-29 DIAGNOSIS — M545 Low back pain: Secondary | ICD-10-CM | POA: Diagnosis not present

## 2019-09-29 DIAGNOSIS — G8929 Other chronic pain: Secondary | ICD-10-CM

## 2019-09-29 DIAGNOSIS — Z79891 Long term (current) use of opiate analgesic: Secondary | ICD-10-CM | POA: Diagnosis not present

## 2019-09-29 DIAGNOSIS — M961 Postlaminectomy syndrome, not elsewhere classified: Secondary | ICD-10-CM | POA: Diagnosis not present

## 2019-09-29 DIAGNOSIS — M533 Sacrococcygeal disorders, not elsewhere classified: Secondary | ICD-10-CM

## 2019-09-29 DIAGNOSIS — I10 Essential (primary) hypertension: Secondary | ICD-10-CM | POA: Diagnosis not present

## 2019-09-29 DIAGNOSIS — Z5181 Encounter for therapeutic drug level monitoring: Secondary | ICD-10-CM | POA: Diagnosis not present

## 2019-09-29 DIAGNOSIS — E119 Type 2 diabetes mellitus without complications: Secondary | ICD-10-CM | POA: Diagnosis not present

## 2019-09-29 MED ORDER — OXYCODONE-ACETAMINOPHEN 10-325 MG PO TABS: ORAL_TABLET | ORAL | 0 refills | Status: DC

## 2019-09-29 NOTE — Patient Instructions (Signed)
Sacroiliac injection was performed today. A combination of a naming medicine plus a cortisone medicine was injected. The injection was done under x-ray guidance. This procedure has been performed to help reduce low back and buttocks pain as well as potentially hip pain. The duration of this injection is variable lasting from hours to  Months. It may repeated if needed. 

## 2019-09-29 NOTE — Progress Notes (Signed)

## 2019-09-29 NOTE — Progress Notes (Signed)
°  PROCEDURE RECORD [facility]   Name: Jeffrey Austin DOB:16-Jul-1960 MRN: [id number]  Date:09/29/2019  Physician: Alysia Penna, MD    Nurse/CMA: Jerline Pain MA  Allergies: No Known Allergies  Consent Signed: Yes.    Is patient diabetic? No.  CBG today?   Pregnant: No. LMP: No LMP for male patient. (age 58-55)  Anticoagulants: no Anti-inflammatory: no Antibiotics: no  Procedure: Right Sacroiliac Joint Injection Position: Prone Start Time: 10:23 AM End Time: 10:27 Fluoro Time: 15  RN/CMA Truman Hayward, CMA Parker, RMA    Time 9:53am 10:27 AM    BP 153/101 155/97    Pulse 90 77    Respirations 16 16    O2 Sat 98 98    S/S 6 6    Pain Level 7/10 5/10     D/C home with no one, [name] & O X 3, D/C instructions reviewed, and sits independently.

## 2019-10-06 ENCOUNTER — Telehealth: Payer: Self-pay

## 2019-10-06 MED ORDER — OXYCODONE HCL 5 MG PO TABS: 5.0000 mg | ORAL_TABLET | Freq: Four times a day (QID) | ORAL | 0 refills | Status: DC | PRN

## 2019-10-06 NOTE — Telephone Encounter (Signed)
Patient called stating he is in [place] and must have missed counted his medication before leaving and is short #5 of Oxycodone/APAP. Would like med sent in to [facility], [place]

## 2019-10-27 ENCOUNTER — Encounter: Payer: Medicare HMO | Admitting: Registered Nurse

## 2019-10-28 ENCOUNTER — Encounter: Payer: Medicare HMO | Attending: Physical Medicine & Rehabilitation | Admitting: Registered Nurse

## 2019-10-28 ENCOUNTER — Encounter: Payer: Self-pay | Admitting: Registered Nurse

## 2019-10-28 ENCOUNTER — Other Ambulatory Visit: Payer: Self-pay

## 2019-10-28 VITALS — BP 149/94 | HR 85 | Temp 98.8°F | Ht 70.0 in | Wt 217.6 lb

## 2019-10-28 DIAGNOSIS — M961 Postlaminectomy syndrome, not elsewhere classified: Secondary | ICD-10-CM

## 2019-10-28 DIAGNOSIS — E119 Type 2 diabetes mellitus without complications: Secondary | ICD-10-CM | POA: Diagnosis not present

## 2019-10-28 DIAGNOSIS — Z981 Arthrodesis status: Secondary | ICD-10-CM | POA: Insufficient documentation

## 2019-10-28 DIAGNOSIS — G894 Chronic pain syndrome: Secondary | ICD-10-CM | POA: Diagnosis not present

## 2019-10-28 DIAGNOSIS — G8929 Other chronic pain: Secondary | ICD-10-CM | POA: Diagnosis not present

## 2019-10-28 DIAGNOSIS — Z5181 Encounter for therapeutic drug level monitoring: Secondary | ICD-10-CM | POA: Diagnosis not present

## 2019-10-28 DIAGNOSIS — M5416 Radiculopathy, lumbar region: Secondary | ICD-10-CM

## 2019-10-28 DIAGNOSIS — Z79891 Long term (current) use of opiate analgesic: Secondary | ICD-10-CM | POA: Diagnosis not present

## 2019-10-28 DIAGNOSIS — I10 Essential (primary) hypertension: Secondary | ICD-10-CM | POA: Insufficient documentation

## 2019-10-28 DIAGNOSIS — F17210 Nicotine dependence, cigarettes, uncomplicated: Secondary | ICD-10-CM | POA: Insufficient documentation

## 2019-10-28 DIAGNOSIS — M545 Low back pain: Secondary | ICD-10-CM | POA: Diagnosis not present

## 2019-10-28 DIAGNOSIS — Z9889 Other specified postprocedural states: Secondary | ICD-10-CM | POA: Diagnosis not present

## 2019-10-28 NOTE — Progress Notes (Signed)
Subjective:    Patient ID: Jeffrey Austin, male    DOB: 07/20/1960, 59 y.o.   MRN: [id number]  HPI: Jeffrey Austin is a 59 y.o. male who returns for follow up appointment for chronic pain and medication refill. He states his pain is located in his lower back radiating into his bilateral lower extremities. He rates his pain 6. His current exercise regime is walking and performing yard work.  Jeffrey Austin arrived hypertensive, blood pressure was re-checked.   Jeffrey Austin Morphine equivalent is 45.00 MME.  Oral Swab was Performed today.   Pain Inventory Average Pain 7 Pain Right Now 6 My pain is sharp, burning and tingling  In the last 24 hours, has pain interfered with the following? General activity 7 Relation with others 3 Enjoyment of life 6 What TIME of day is your pain at its worst? morning  and evening Sleep (in general) Fair  Pain is worse with: bending, sitting, inactivity and some activites Pain improves with: heat/ice, pacing activities and medication Relief from Meds: 5  Family History  Problem Relation Age of Onset  . Leukemia Father   . Colon cancer Neg Hx   . Esophageal cancer Neg Hx   . Rectal cancer Neg Hx   . Stomach cancer Neg Hx    Social History   Socioeconomic History  . Marital status: Married    Spouse name: Not on file  . Number of children: Not on file  . Years of education: Not on file  . Highest education level: Not on file  Occupational History  . Not on file  Tobacco Use  . Smoking status: Current Some Day Smoker    Packs/day: 0.25    Types: Cigarettes  . Smokeless tobacco: Never Used  Vaping Use  . Vaping Use: Never used  Substance and Sexual Activity  . Alcohol use: Yes    Alcohol/week: 5.0 - 10.0 standard drinks    Types: 5 - 10 Standard drinks or equivalent per week  . Drug use: No  . Sexual activity: Yes  Other Topics Concern  . Not on file  Social History Narrative  . Not on file   Social Determinants of Health    Financial Resource Strain:   . Difficulty of Paying Living Expenses: Not on file  Food Insecurity:   . Worried About Charity fundraiser in the Last Year: Not on file  . Ran Out of Food in the Last Year: Not on file  Transportation Needs:   . Lack of Transportation (Medical): Not on file  . Lack of Transportation (Non-Medical): Not on file  Physical Activity:   . Days of Exercise per Week: Not on file  . Minutes of Exercise per Session: Not on file  Stress:   . Feeling of Stress : Not on file  Social Connections:   . Frequency of Communication with Friends and Family: Not on file  . Frequency of Social Gatherings with Friends and Family: Not on file  . Attends Religious Services: Not on file  . Active Member of Clubs or Organizations: Not on file  . Attends Archivist Meetings: Not on file  . Marital Status: Not on file   Past Surgical History:  Procedure Laterality Date  . cyst removed from testicle     . INCISIONAL HERNIA REPAIR N/A 02/01/2016   Procedure: LAPAROSCOPIC INCISIONAL HERNIA REPAIR;  Surgeon: Arta Bruce Kinsinger, MD;  [facility]: [facility];  Service: General;  Laterality: N/A;  . INSERTION OF  MESH N/A 02/01/2016   Procedure: INSERTION OF MESH;  Surgeon: Arta Bruce Kinsinger, MD;  [facility]: [facility];  Service: General;  Laterality: N/A;  . LUMBAR FUSION    . SPINAL FUSION     Past Surgical History:  Procedure Laterality Date  . cyst removed from testicle     . INCISIONAL HERNIA REPAIR N/A 02/01/2016   Procedure: LAPAROSCOPIC INCISIONAL HERNIA REPAIR;  Surgeon: Arta Bruce Kinsinger, MD;  [facility];  Service: General;  Laterality: N/A;  . INSERTION OF MESH N/A 02/01/2016   Procedure: INSERTION OF MESH;  Surgeon: Arta Bruce Kinsinger, MD;  [facility]: [facility];  Service: General;  Laterality: N/A;  . LUMBAR FUSION    . SPINAL FUSION     Past Medical History:  Diagnosis Date  . Back pain   . Diabetes mellitus without complication ([place])   .  Hyperlipidemia   . Hypertension    BP (!) 150/100   Pulse 95   Temp 98.8 F (37.1 C)   Ht 5\' 10"  (1.778 m)   Wt 217 lb 9.6 oz (98.7 kg)   SpO2 98%   BMI 31.22 kg/m   Opioid Risk Score:   Fall Risk Score:  `1  Depression screen PHQ 2/9  Depression screen [facility] 2/9 10/28/2019 12/19/2018 08/06/2018 12/09/2017 09/02/2017  Decreased Interest 0 0 0 0 0  Down, Depressed, Hopeless 0 - 0 0 0  PHQ - 2 Score 0 0 0 0 0    Review of Systems  Musculoskeletal: Positive for back pain.       Leg pain  All other systems reviewed and are negative.      Objective:   Physical Exam Vitals and nursing note reviewed.  Constitutional:      Appearance: Normal appearance.  Cardiovascular:     Rate and Rhythm: Normal rate and regular rhythm.     Pulses: Normal pulses.     Heart sounds: Normal heart sounds.  Musculoskeletal:     Cervical back: Normal range of motion and neck supple.     Comments: Normal Muscle Bulk and Muscle Testing Reveals:  Upper Extremities: Full ROM and Muscle Strength 5/5  Lumbar Hypersensitivity  Lower Extremities: Full ROM and Muscle Strength 5/5 Bilateral Lower Extremities Flexion Produces Pain into his Lumbar and Bilateral Lower Extremities Arises from chair with ease Narrow Based Gait   Skin:    General: Skin is warm and dry.  Neurological:     Mental Status: He is alert and oriented to person, place, and time.  Psychiatric:        Mood and Affect: Mood normal.        Behavior: Behavior normal.           Assessment & Plan:  1.Post Laminectomy Syndrome: Continue current medication regimen and HEP as tolerated.10/28/2019. 2. Chronic Lumbar Radiculopathy:S/P Lumbar Radiofrequency with Dr. Ernestina Patches on 12/24/2017, with relief noted.Continue to Monitor.10/28/2019. 3. Chronic pain Syndrome: Continue Oxycodone 10/325 mg one tablet every8hours as needed for moderate pain.#75. We will continue the opioid monitoring program, this consists of regular clinic visits,  examinations, urine drug screen, pill counts as well as use of [place] Controlled Substance Reporting system. A 12 month History has been reviewed on the [place] Controlled Substance Reporting System on 10/28/2019. 4.BilateralGreater Trochanteric Bursitis:No complaints today. Continue to Alternate Ice and Heat Therapy.Continue to Monitor.10/28/2019 5. Chronic Right Sacroiliac Pain: S/P Right Sacroiliac injection with Dr Letta Pate, with good relief noted. Continue to Monitor.10/28/2019.  40minutes of face to  face patient care time was spent during this visit. All questions were encouraged and answered.  F/U in 1 month

## 2019-11-02 ENCOUNTER — Other Ambulatory Visit: Payer: Self-pay | Admitting: Family Medicine

## 2019-11-02 DIAGNOSIS — E119 Type 2 diabetes mellitus without complications: Secondary | ICD-10-CM

## 2019-11-02 LAB — DRUG TOX MONITOR 1 W/CONF, ORAL FLD
Amphetamines: NEGATIVE ng/mL (ref <10)
Barbiturates: NEGATIVE ng/mL (ref <10)
Benzodiazepines: NEGATIVE ng/mL (ref <0.50)
Buprenorphine: NEGATIVE ng/mL (ref <0.10)
Cocaine: NEGATIVE ng/mL (ref <5.0)
Codeine: NEGATIVE ng/mL (ref <2.5)
Dihydrocodeine: NEGATIVE ng/mL (ref <2.5)
Fentanyl: NEGATIVE ng/mL (ref <0.10)
Heroin Metabolite: NEGATIVE ng/mL (ref <1.0)
Hydrocodone: NEGATIVE ng/mL (ref <2.5)
Hydromorphone: NEGATIVE ng/mL (ref <2.5)
MARIJUANA: NEGATIVE ng/mL (ref <2.5)
MDMA: NEGATIVE ng/mL (ref <10)
Meprobamate: NEGATIVE ng/mL (ref <2.5)
Methadone: NEGATIVE ng/mL (ref <5.0)
Morphine: NEGATIVE ng/mL (ref <2.5)
Nicotine Metabolite: NEGATIVE ng/mL (ref <5.0)
Norhydrocodone: NEGATIVE ng/mL (ref <2.5)
Noroxycodone: 21.4 ng/mL — ABNORMAL HIGH (ref <2.5)
Opiates: POSITIVE ng/mL — AB (ref <2.5)
Oxycodone: >250 ng/mL — ABNORMAL HIGH (ref <2.5)
Oxymorphone: NEGATIVE ng/mL (ref <2.5)
Phencyclidine: NEGATIVE ng/mL (ref <10)
Tapentadol: NEGATIVE ng/mL (ref <5.0)
Tramadol: NEGATIVE ng/mL (ref <5.0)
Zolpidem: NEGATIVE ng/mL (ref <5.0)

## 2019-11-02 LAB — DRUG TOX ALC METAB W/CON, ORAL FLD
Alcohol Metabolite: POSITIVE ng/mL — AB (ref <25)
Ethyl Sulfate: 154 ng/mL — ABNORMAL HIGH (ref <25)

## 2019-11-05 ENCOUNTER — Telehealth: Payer: Self-pay | Admitting: *Deleted

## 2019-11-05 NOTE — Telephone Encounter (Signed)
Oral swab drug screen was consistent for prescribed medications. However it is positive for alcohol. This is a second positive for him. A formal warning has been sent through [place] [facility] [place].

## 2019-12-01 ENCOUNTER — Other Ambulatory Visit: Payer: Self-pay

## 2019-12-01 ENCOUNTER — Encounter: Payer: Medicare HMO | Attending: Physical Medicine & Rehabilitation | Admitting: Registered Nurse

## 2019-12-01 ENCOUNTER — Encounter: Payer: Self-pay | Admitting: Registered Nurse

## 2019-12-01 VITALS — BP 167/99 | HR 99 | Temp 98.8°F | Ht 70.0 in | Wt 215.0 lb

## 2019-12-01 DIAGNOSIS — I10 Essential (primary) hypertension: Secondary | ICD-10-CM | POA: Insufficient documentation

## 2019-12-01 DIAGNOSIS — Z981 Arthrodesis status: Secondary | ICD-10-CM | POA: Insufficient documentation

## 2019-12-01 DIAGNOSIS — M5416 Radiculopathy, lumbar region: Secondary | ICD-10-CM

## 2019-12-01 DIAGNOSIS — Z79891 Long term (current) use of opiate analgesic: Secondary | ICD-10-CM

## 2019-12-01 DIAGNOSIS — F17210 Nicotine dependence, cigarettes, uncomplicated: Secondary | ICD-10-CM | POA: Diagnosis not present

## 2019-12-01 DIAGNOSIS — M545 Low back pain, unspecified: Secondary | ICD-10-CM

## 2019-12-01 DIAGNOSIS — M961 Postlaminectomy syndrome, not elsewhere classified: Secondary | ICD-10-CM | POA: Diagnosis not present

## 2019-12-01 DIAGNOSIS — Z5181 Encounter for therapeutic drug level monitoring: Secondary | ICD-10-CM | POA: Diagnosis not present

## 2019-12-01 DIAGNOSIS — G894 Chronic pain syndrome: Secondary | ICD-10-CM | POA: Diagnosis not present

## 2019-12-01 DIAGNOSIS — Z9889 Other specified postprocedural states: Secondary | ICD-10-CM | POA: Diagnosis not present

## 2019-12-01 DIAGNOSIS — G8929 Other chronic pain: Secondary | ICD-10-CM | POA: Diagnosis not present

## 2019-12-01 DIAGNOSIS — E119 Type 2 diabetes mellitus without complications: Secondary | ICD-10-CM | POA: Insufficient documentation

## 2019-12-01 MED ORDER — OXYCODONE-ACETAMINOPHEN 10-325 MG PO TABS: ORAL_TABLET | ORAL | 0 refills | Status: DC

## 2019-12-01 NOTE — Progress Notes (Signed)
Subjective:    Patient ID: Jeffrey Austin, male    DOB: Mar 18, 1960, 59 y.o.   MRN: [id number]  HPI: Jeffrey Austin is a 59 y.o. male who returns for follow up appointment for chronic pain and medication refill. He states his pain is located in his mid- lower back radiating into his bilateral lower extremities. He rates his pain 6. His current exercise regime is walking and performing stretching exercises.  Jeffrey Austin Morphine equivalent is 45.00 MME.  Last Oral Swab was Performed on 10/28/2019, it was inconsistent for +ETOH. [name] was educated on the narcotic policy and is aware if this occurs again, he will be discharged from our office, he verbalizes understanding.    Pain Inventory Average Pain 6 Pain Right Now 6 My pain is sharp, tingling and aching  In the last 24 hours, has pain interfered with the following? General activity 3 Relation with others 2 Enjoyment of life 4 What TIME of day is your pain at its worst? morning  and daytime Sleep (in general) Fair  Pain is worse with: bending, sitting and inactivity Pain improves with: heat/ice, medication and injections Relief from Meds: 5  Family History  Problem Relation Age of Onset  . Leukemia Father   . Colon cancer Neg Hx   . Esophageal cancer Neg Hx   . Rectal cancer Neg Hx   . Stomach cancer Neg Hx    Social History   Socioeconomic History  . Marital status: Married    Spouse name: Not on file  . Number of children: Not on file  . Years of education: Not on file  . Highest education level: Not on file  Occupational History  . Not on file  Tobacco Use  . Smoking status: Current Some Day Smoker    Packs/day: 0.25    Types: Cigarettes  . Smokeless tobacco: Never Used  Vaping Use  . Vaping Use: Never used  Substance and Sexual Activity  . Alcohol use: Yes    Alcohol/week: 5.0 - 10.0 standard drinks    Types: 5 - 10 Standard drinks or equivalent per week  . Drug use: No  . Sexual activity: Yes    Other Topics Concern  . Not on file  Social History Narrative  . Not on file   Social Determinants of Health   Financial Resource Strain:   . Difficulty of Paying Living Expenses: Not on file  Food Insecurity:   . Worried About Charity fundraiser in the Last Year: Not on file  . Ran Out of Food in the Last Year: Not on file  Transportation Needs:   . Lack of Transportation (Medical): Not on file  . Lack of Transportation (Non-Medical): Not on file  Physical Activity:   . Days of Exercise per Week: Not on file  . Minutes of Exercise per Session: Not on file  Stress:   . Feeling of Stress : Not on file  Social Connections:   . Frequency of Communication with Friends and Family: Not on file  . Frequency of Social Gatherings with Friends and Family: Not on file  . Attends Religious Services: Not on file  . Active Member of Clubs or Organizations: Not on file  . Attends Archivist Meetings: Not on file  . Marital Status: Not on file   Past Surgical History:  Procedure Laterality Date  . cyst removed from testicle     . INCISIONAL HERNIA REPAIR N/A 02/01/2016   Procedure: LAPAROSCOPIC INCISIONAL  HERNIA REPAIR;  Surgeon: Mickeal Skinner, MD;  Location: [facility];  Service: General;  Laterality: N/A;  . INSERTION OF MESH N/A 02/01/2016   Procedure: INSERTION OF MESH;  Surgeon: Arta Bruce Kinsinger, MD;  Location: [facility];  Service: General;  Laterality: N/A;  . LUMBAR FUSION    . SPINAL FUSION     Past Surgical History:  Procedure Laterality Date  . cyst removed from testicle     . INCISIONAL HERNIA REPAIR N/A 02/01/2016   Procedure: LAPAROSCOPIC INCISIONAL HERNIA REPAIR;  Surgeon: Arta Bruce Kinsinger, MD;  [facility];  Service: General;  Laterality: N/A;  . INSERTION OF MESH N/A 02/01/2016   Procedure: INSERTION OF MESH;  Surgeon: Arta Bruce Kinsinger, MD;  [facility];  Service: General;  Laterality: N/A;  . LUMBAR FUSION    . SPINAL FUSION     Past  Medical History:  Diagnosis Date  . Back pain   . Diabetes mellitus without complication ([place])   . Hyperlipidemia   . Hypertension    BP (!) 167/99   Pulse 99   Temp 98.8 F (37.1 C)   Ht 5\' 10"  (1.778 m)   Wt 215 lb (97.5 kg)   SpO2 98%   BMI 30.85 kg/m   Opioid Risk Score:   Fall Risk Score:  `1  Depression screen PHQ 2/9  Depression screen [facility] 2/9 10/28/2019 12/19/2018 08/06/2018 12/09/2017 09/02/2017  Decreased Interest 0 0 0 0 0  Down, Depressed, Hopeless 0 - 0 0 0  PHQ - 2 Score 0 0 0 0 0    Review of Systems  Constitutional: Negative.   HENT: Negative.   Eyes: Negative.   Respiratory: Negative.   Cardiovascular: Negative.   Gastrointestinal: Negative.   Endocrine: Negative.   Genitourinary: Negative.   Musculoskeletal: Positive for back pain.  Skin: Negative.   Allergic/Immunologic: Negative.   Neurological:       Tingling   Hematological: Negative.   Psychiatric/Behavioral: Negative.   All other systems reviewed and are negative.      Objective:   Physical Exam Vitals and nursing note reviewed.  Constitutional:      Appearance: Normal appearance.  Cardiovascular:     Rate and Rhythm: Normal rate and regular rhythm.     Pulses: Normal pulses.     Heart sounds: Normal heart sounds.  Pulmonary:     Effort: Pulmonary effort is normal.     Breath sounds: Normal breath sounds.  Musculoskeletal:     Cervical back: Normal range of motion and neck supple.     Comments: Normal Muscle Bulk and Muscle Testing Reveals:  Upper Extremities: Full ROM and Muscle Strength 5/5 Lumbar Hypersensitivity Lower Extremities: Full ROM and Muscle Strength 5/5  Arises from chair with ease Narrow Based Gait   Skin:    General: Skin is warm and dry.  Neurological:     Mental Status: He is alert and oriented to person, place, and time.  Psychiatric:        Mood and Affect: Mood normal.        Behavior: Behavior normal.           Assessment & Plan:  1.Post  Laminectomy Syndrome: Continue current medication regimen and HEP as tolerated.12/01/2019. 2. Chronic Lumbar Radiculopathy:S/P Lumbar Radiofrequency with Dr. Ernestina Patches on 12/24/2017, with relief noted.Continue to Monitor.12/01/2019. 3. Chronic pain Syndrome: Continue Oxycodone 10/325 mg one tablet every8hours as needed for moderate pain.#75. We will continue the opioid monitoring program, this consists  of regular clinic visits, examinations, urine drug screen, pill counts as well as use of [place] Controlled Substance Reporting system. A 12 month History has been reviewed on the [place] Controlled Substance Reporting System on 12/01/2019. 4.BilateralGreater Trochanteric Bursitis:No complaints today. Continue to Alternate Ice and Heat Therapy.Continue to Monitor.12/01/2019 5.Chronic Right Sacroiliac Pain: S/PRight Sacroiliac injection with Dr Letta Pate, with good relief noted. Continue to Monitor.12/01/2019.  37minutes of face to face patient care time was spent during this visit. All questions were encouraged and answered.  F/U in 1 month

## 2019-12-09 ENCOUNTER — Other Ambulatory Visit: Payer: Self-pay | Admitting: Family Medicine

## 2019-12-09 DIAGNOSIS — E119 Type 2 diabetes mellitus without complications: Secondary | ICD-10-CM

## 2019-12-31 ENCOUNTER — Other Ambulatory Visit: Payer: Self-pay

## 2019-12-31 ENCOUNTER — Encounter: Payer: Medicare HMO | Attending: Physical Medicine & Rehabilitation | Admitting: Registered Nurse

## 2019-12-31 ENCOUNTER — Encounter: Payer: Self-pay | Admitting: Registered Nurse

## 2019-12-31 VITALS — BP 162/97 | HR 86 | Temp 98.9°F | Ht 70.0 in | Wt 217.4 lb

## 2019-12-31 DIAGNOSIS — Z5181 Encounter for therapeutic drug level monitoring: Secondary | ICD-10-CM

## 2019-12-31 DIAGNOSIS — M961 Postlaminectomy syndrome, not elsewhere classified: Secondary | ICD-10-CM

## 2019-12-31 DIAGNOSIS — G894 Chronic pain syndrome: Secondary | ICD-10-CM | POA: Diagnosis not present

## 2019-12-31 DIAGNOSIS — Z79891 Long term (current) use of opiate analgesic: Secondary | ICD-10-CM | POA: Diagnosis not present

## 2019-12-31 DIAGNOSIS — M5416 Radiculopathy, lumbar region: Secondary | ICD-10-CM | POA: Diagnosis not present

## 2019-12-31 MED ORDER — OXYCODONE-ACETAMINOPHEN 7.5-325 MG PO TABS: 1.0000 | ORAL_TABLET | Freq: Two times a day (BID) | ORAL | 0 refills | Status: DC | PRN

## 2019-12-31 NOTE — Progress Notes (Signed)
Subjective:    Patient ID: Jeffrey Austin, male    DOB: November 29, 1960, 59 y.o.   MRN: [id number]  HPI: Jeffrey Austin is a 59 y.o. male who returns for follow up appointment for chronic pain and medication refill. He states his pain is located in his lower back radiating into his bilateral lower extremities. He rates his pain 6. His current exercise regime is walking and performing stretching exercises.  Jeffrey Austin tolerating the slow weaning of Oxycodone we will decrease his Oxycodone to 7.5 mg, Jeffrey Austin agrees with treatment plan. He was instructed to call or send a My-Chart  in a few weeks to evaluate medication change, he verbalizes understanding.   Jeffrey Austin Morphine equivalent is 37.50  MME.  Last Oral Swab was Performed on 10/28/2019, it was consistent.    Pain Inventory Average Pain 6 Pain Right Now 6 My pain is sharp, stabbing, tingling and aching  In the last 24 hours, has pain interfered with the following? General activity 4 Relation with others 2 Enjoyment of life 4 What TIME of day is your pain at its worst? morning  and evening Sleep (in general) Good  Pain is worse with: walking, bending, sitting and inactivity Pain improves with: heat/ice, medication and injections Relief from Meds: 6  Family History  Problem Relation Age of Onset  . Leukemia Father   . Colon cancer Neg Hx   . Esophageal cancer Neg Hx   . Rectal cancer Neg Hx   . Stomach cancer Neg Hx    Social History   Socioeconomic History  . Marital status: Married    Spouse name: Not on file  . Number of children: Not on file  . Years of education: Not on file  . Highest education level: Not on file  Occupational History  . Not on file  Tobacco Use  . Smoking status: Current Some Day Smoker    Packs/day: 0.25    Types: Cigarettes  . Smokeless tobacco: Never Used  Vaping Use  . Vaping Use: Never used  Substance and Sexual Activity  . Alcohol use: Yes    Alcohol/week: 5.0 - 10.0  standard drinks    Types: 5 - 10 Standard drinks or equivalent per week  . Drug use: No  . Sexual activity: Yes  Other Topics Concern  . Not on file  Social History Narrative  . Not on file   Social Determinants of Health   Financial Resource Strain:   . Difficulty of Paying Living Expenses: Not on file  Food Insecurity:   . Worried About Charity fundraiser in the Last Year: Not on file  . Ran Out of Food in the Last Year: Not on file  Transportation Needs:   . Lack of Transportation (Medical): Not on file  . Lack of Transportation (Non-Medical): Not on file  Physical Activity:   . Days of Exercise per Week: Not on file  . Minutes of Exercise per Session: Not on file  Stress:   . Feeling of Stress : Not on file  Social Connections:   . Frequency of Communication with Friends and Family: Not on file  . Frequency of Social Gatherings with Friends and Family: Not on file  . Attends Religious Services: Not on file  . Active Member of Clubs or Organizations: Not on file  . Attends Archivist Meetings: Not on file  . Marital Status: Not on file   Past Surgical History:  Procedure Laterality Date  .  cyst removed from testicle     . INCISIONAL HERNIA REPAIR N/A 02/01/2016   Procedure: LAPAROSCOPIC INCISIONAL HERNIA REPAIR;  Surgeon: Arta Bruce Kinsinger, MD;  [facility];  Service: General;  Laterality: N/A;  . INSERTION OF MESH N/A 02/01/2016   Procedure: INSERTION OF MESH;  Surgeon: Arta Bruce Kinsinger, MD;  [facility];  Service: General;  Laterality: N/A;  . LUMBAR FUSION    . SPINAL FUSION     Past Surgical History:  Procedure Laterality Date  . cyst removed from testicle     . INCISIONAL HERNIA REPAIR N/A 02/01/2016   Procedure: LAPAROSCOPIC INCISIONAL HERNIA REPAIR;  Surgeon: Arta Bruce Kinsinger, MD;  [facility];  Service: General;  Laterality: N/A;  . INSERTION OF MESH N/A 02/01/2016   Procedure: INSERTION OF MESH;  Surgeon: Arta Bruce  Kinsinger, MD;  [facility];  Service: General;  Laterality: N/A;  . LUMBAR FUSION    . SPINAL FUSION     Past Medical History:  Diagnosis Date  . Back pain   . Diabetes mellitus without complication ([place])   . Hyperlipidemia   . Hypertension    BP (!) 162/97   Pulse 86   Temp 98.9 F (37.2 C)   Ht 5\' 10"  (1.778 m)   Wt 217 lb 6.4 oz (98.6 kg)   SpO2 98%   BMI 31.19 kg/m   Opioid Risk Score:   Fall Risk Score:  `1  Depression screen PHQ 2/9  Depression screen [facility] 2/9 10/28/2019 12/19/2018 08/06/2018 12/09/2017 09/02/2017  Decreased Interest 0 0 0 0 0  Down, Depressed, Hopeless 0 - 0 0 0  PHQ - 2 Score 0 0 0 0 0   Review of Systems  Musculoskeletal: Positive for back pain.  All other systems reviewed and are negative.      Objective:   Physical Exam Vitals and nursing note reviewed.  Constitutional:      Appearance: Normal appearance.  Cardiovascular:     Rate and Rhythm: Normal rate and regular rhythm.     Pulses: Normal pulses.     Heart sounds: Normal heart sounds.  Pulmonary:     Effort: Pulmonary effort is normal.     Breath sounds: Normal breath sounds.  Musculoskeletal:     Cervical back: Normal range of motion and neck supple.     Comments: Normal Muscle Bulk and Muscle Testing Reveals:  Upper Extremities: Full ROM and Muscle Strength 5/5  Lumbar Hypersensitivity Lower Extremities: Right: Decreased ROM and Muscle Strength 5/5 Right Lower Extremity Flexion Produces Pain into his Lumbar Left Lower Extremity: Full ROM and Muscle Strength 5/5 Arises from chair with ease Narrow Based  Gait   Skin:    General: Skin is warm and dry.  Neurological:     Mental Status: He is alert and oriented to person, place, and time.  Psychiatric:        Mood and Affect: Mood normal.        Behavior: Behavior normal.           Assessment & Plan:  1.Post Laminectomy Syndrome: Continue current medication regimen and HEP as tolerated.12/31/2019. 2. Chronic Lumbar  Radiculopathy:S/P Lumbar Radiofrequency with Dr. Ernestina Patches on 12/24/2017, with relief noted.Continue to Monitor.12/31/2019. 3. Chronic pain Syndrome:Decreased:Oxycodone 7.5/325 mg one tablet twice a day as needed for moderate pain.# 60. We will continue the opioid monitoring program, this consists of regular clinic visits, examinations, urine drug screen, pill counts as well as use of [place]  Controlled Substance Reporting system. A 12 month History has been reviewed on the [place] Controlled Substance Reporting Systemon 12/31/2019. 4.BilateralGreater Trochanteric Bursitis:No complaints today.Continue to Alternate Ice and Heat Therapy.Continue to Monitor.12/31/2019 5.Chronic Right Sacroiliac Pain: S/PRight Sacroiliac injection with Dr Letta Pate, with good relief noted. Continue to Monitor.12/31/2019.  68minutes of face to face patient care time was spent during this visit. All questions were encouraged and answered.  F/U in 1 month

## 2020-01-14 ENCOUNTER — Other Ambulatory Visit: Payer: Self-pay | Admitting: Family Medicine

## 2020-01-14 DIAGNOSIS — E119 Type 2 diabetes mellitus without complications: Secondary | ICD-10-CM

## 2020-02-02 ENCOUNTER — Encounter: Payer: Medicare HMO | Admitting: Registered Nurse

## 2020-02-09 ENCOUNTER — Other Ambulatory Visit: Payer: Self-pay

## 2020-02-09 ENCOUNTER — Encounter: Payer: Self-pay | Admitting: Registered Nurse

## 2020-02-09 ENCOUNTER — Encounter: Payer: Medicare HMO | Attending: Physical Medicine & Rehabilitation | Admitting: Registered Nurse

## 2020-02-09 VITALS — BP 166/110 | HR 86 | Temp 98.7°F | Ht 70.0 in | Wt 218.0 lb

## 2020-02-09 DIAGNOSIS — I10 Essential (primary) hypertension: Secondary | ICD-10-CM | POA: Diagnosis not present

## 2020-02-09 DIAGNOSIS — Z5181 Encounter for therapeutic drug level monitoring: Secondary | ICD-10-CM | POA: Diagnosis not present

## 2020-02-09 DIAGNOSIS — M5416 Radiculopathy, lumbar region: Secondary | ICD-10-CM | POA: Diagnosis not present

## 2020-02-09 DIAGNOSIS — Z79891 Long term (current) use of opiate analgesic: Secondary | ICD-10-CM | POA: Insufficient documentation

## 2020-02-09 DIAGNOSIS — M961 Postlaminectomy syndrome, not elsewhere classified: Secondary | ICD-10-CM | POA: Diagnosis not present

## 2020-02-09 DIAGNOSIS — G894 Chronic pain syndrome: Secondary | ICD-10-CM | POA: Diagnosis not present

## 2020-02-09 MED ORDER — OXYCODONE-ACETAMINOPHEN 5-325 MG PO TABS: 1.0000 | ORAL_TABLET | Freq: Two times a day (BID) | ORAL | 0 refills | Status: DC | PRN

## 2020-02-09 MED ORDER — OXYCODONE-ACETAMINOPHEN 5-325 MG PO TABS: ORAL_TABLET | ORAL | 0 refills | Status: DC

## 2020-02-09 NOTE — Progress Notes (Signed)
Subjective:    Patient ID: Jeffrey Austin, male    DOB: January 01, 1961, 59 y.o.   MRN: [id number]  HPI: [name] is a 59 y.o. male who returns for follow up appointment for chronic pain and medication refill. He states his pain is located in his lower back radiating into his bilateral lower extremities. He rates his pain 5. His current exercise regime is walking and performing stretching exercises.  [name] arrives hypertensive, blood pressure was re-checked. He refuses Ed or Urgent care evaluation. Also states he is compliant with his anti-hypertensive medications. [name] states he will recheck his blood pressure when he gets home and send a reading to this provider via My-Chart.  [name] also states he will be going to [place] from 03/01/2020 to 04/10/2020.   [name] Morphine equivalent is 45.00 MME, [name] is tolerating his slow weaning of oxycodone, he reports.    Oral Swab was Performed on 10/28/2019, it was consistent.   Pain Inventory Average Pain 6 Pain Right Now 5 My pain is burning, tingling and aching  In the last 24 hours, has pain interfered with the following? General activity 4 Relation with others 2 Enjoyment of life 2 What TIME of day is your pain at its worst? morning  and evening Sleep (in general) Good  Pain is worse with: bending, sitting and inactivity Pain improves with: heat/ice, pacing activities and medication Relief from Meds: 5  Family History  Problem Relation Age of Onset  . Leukemia Father   . Colon cancer Neg Hx   . Esophageal cancer Neg Hx   . Rectal cancer Neg Hx   . Stomach cancer Neg Hx    Social History   Socioeconomic History  . Marital status: Married    Spouse name: Not on file  . Number of children: Not on file  . Years of education: Not on file  . Highest education level: Not on file  Occupational History  . Not on file  Tobacco Use  . Smoking status: Current Some Day Smoker    Packs/day:  0.25    Types: Cigarettes  . Smokeless tobacco: Never Used  Vaping Use  . Vaping Use: Never used  Substance and Sexual Activity  . Alcohol use: Yes    Alcohol/week: 5.0 - 10.0 standard drinks    Types: 5 - 10 Standard drinks or equivalent per week  . Drug use: No  . Sexual activity: Yes  Other Topics Concern  . Not on file  Social History Narrative  . Not on file   Social Determinants of Health   Financial Resource Strain:   . Difficulty of Paying Living Expenses: Not on file  Food Insecurity:   . Worried About Charity fundraiser in the Last Year: Not on file  . Ran Out of Food in the Last Year: Not on file  Transportation Needs:   . Lack of Transportation (Medical): Not on file  . Lack of Transportation (Non-Medical): Not on file  Physical Activity:   . Days of Exercise per Week: Not on file  . Minutes of Exercise per Session: Not on file  Stress:   . Feeling of Stress : Not on file  Social Connections:   . Frequency of Communication with Friends and Family: Not on file  . Frequency of Social Gatherings with Friends and Family: Not on file  . Attends Religious Services: Not on file  . Active Member of Clubs or Organizations: Not on file  .  Attends Archivist Meetings: Not on file  . Marital Status: Not on file   Past Surgical History:  Procedure Laterality Date  . cyst removed from testicle     . INCISIONAL HERNIA REPAIR N/A 02/01/2016   Procedure: LAPAROSCOPIC INCISIONAL HERNIA REPAIR;  Surgeon: Arta Bruce Kinsinger, MD;  [facility];  Service: General;  Laterality: N/A;  . INSERTION OF MESH N/A 02/01/2016   Procedure: INSERTION OF MESH;  Surgeon: Arta Bruce Kinsinger, MD;  [facility];  Service: General;  Laterality: N/A;  . LUMBAR FUSION    . SPINAL FUSION     Past Surgical History:  Procedure Laterality Date  . cyst removed from testicle     . INCISIONAL HERNIA REPAIR N/A 02/01/2016   Procedure: LAPAROSCOPIC INCISIONAL HERNIA REPAIR;   Surgeon: Arta Bruce Kinsinger, MD;  [facility];  Service: General;  Laterality: N/A;  . INSERTION OF MESH N/A 02/01/2016   Procedure: INSERTION OF MESH;  Surgeon: Arta Bruce Kinsinger, MD;  [facility];  Service: General;  Laterality: N/A;  . LUMBAR FUSION    . SPINAL FUSION     Past Medical History:  Diagnosis Date  . Back pain   . Diabetes mellitus without complication ([place])   . Hyperlipidemia   . Hypertension    BP (!) 176/115 (BP Location: Left Arm, Patient Position: Sitting, Cuff Size: Small)   Pulse 86   Temp 98.7 F (37.1 C)   Ht 5\' 10"  (1.778 m)   Wt 218 lb (98.9 kg)   SpO2 98%   BMI 31.28 kg/m   Opioid Risk Score:   Fall Risk Score:  `1  Depression screen PHQ 2/9  Depression screen [facility] 2/9 10/28/2019 12/19/2018 08/06/2018 12/09/2017 09/02/2017  Decreased Interest 0 0 0 0 0  Down, Depressed, Hopeless 0 - 0 0 0  PHQ - 2 Score 0 0 0 0 0    Review of Systems  Constitutional: Negative.   HENT: Negative.   Eyes: Negative.   Respiratory: Negative.   Cardiovascular: Negative.   Gastrointestinal: Negative.   Endocrine: Negative.   Genitourinary: Negative.   Musculoskeletal: Positive for back pain.  Skin: Negative.   Allergic/Immunologic: Negative.   Hematological: Negative.   Psychiatric/Behavioral: Negative.   All other systems reviewed and are negative.      Objective:   Physical Exam Vitals and nursing note reviewed.  Constitutional:      Appearance: Normal appearance.  Cardiovascular:     Rate and Rhythm: Normal rate and regular rhythm.     Pulses: Normal pulses.     Heart sounds: Normal heart sounds.  Pulmonary:     Effort: Pulmonary effort is normal.     Breath sounds: Normal breath sounds.  Musculoskeletal:     Cervical back: Normal range of motion and neck supple.     Comments: Normal Muscle Bulk and Muscle Testing Reveals:  Upper Extremities: Full ROM and Muscle Strength 5/5 Lumbar Hypersensitivity Lower Extremities: Full ROM and  Muscle Strength 5/5 Arises from chair with ease Narrow Based  Gait   Skin:    General: Skin is warm and dry.  Neurological:     Mental Status: He is alert and oriented to person, place, and time.  Psychiatric:        Mood and Affect: Mood normal.        Behavior: Behavior normal.           Assessment & Plan:  1.Post Laminectomy Syndrome: Continue current medication regimen  and HEP as tolerated.02/09/2020. 2. Chronic Lumbar Radiculopathy:S/P Lumbar Radiofrequency with Dr. Ernestina Patches on 12/24/2017, with relief noted.Continue to Monitor.112/207/2021. 3. Chronic pain Syndrome:Decreased:Oxycodone 5.0/325 mg 1/2 tablet to one tablet twice a day as needed for moderate pain. May take an extra tablet when pain is sever# 75. We will continue the opioid monitoring program, this consists of regular clinic visits, examinations, urine drug screen, pill counts as well as use of [place] Controlled Substance Reporting system. A 12 month History has been reviewed on the [place] Controlled Substance Reporting Systemon 02/09/2020. 4.BilateralGreater Trochanteric Bursitis:No complaints today.Continue to Alternate Ice and Heat Therapy.Continue to Monitor.02/09/2020 5.Chronic Right Sacroiliac Pain: S/PRight Sacroiliac injection with Dr Letta Pate, with good relief noted. Continue to Monitor.02/09/2020.  F/U in 1 month

## 2020-03-31 ENCOUNTER — Other Ambulatory Visit: Payer: Self-pay | Admitting: Family Medicine

## 2020-03-31 DIAGNOSIS — E119 Type 2 diabetes mellitus without complications: Secondary | ICD-10-CM

## 2020-04-01 ENCOUNTER — Telehealth: Payer: Self-pay | Admitting: Registered Nurse

## 2020-04-01 NOTE — Telephone Encounter (Signed)
Patient called and cancelled future appointment. He stated he no longer took medications so no need for appointment.

## 2020-04-13 ENCOUNTER — Ambulatory Visit: Payer: Medicare HMO | Admitting: Registered Nurse

## 2020-06-13 ENCOUNTER — Other Ambulatory Visit: Payer: Self-pay | Admitting: Family Medicine

## 2020-06-13 DIAGNOSIS — E119 Type 2 diabetes mellitus without complications: Secondary | ICD-10-CM

## 2020-07-25 ENCOUNTER — Other Ambulatory Visit: Payer: Self-pay | Admitting: Family Medicine

## 2020-07-25 DIAGNOSIS — E119 Type 2 diabetes mellitus without complications: Secondary | ICD-10-CM

## 2020-08-26 ENCOUNTER — Other Ambulatory Visit: Payer: Self-pay | Admitting: Family Medicine

## 2020-08-26 DIAGNOSIS — E119 Type 2 diabetes mellitus without complications: Secondary | ICD-10-CM

## 2021-01-21 ENCOUNTER — Other Ambulatory Visit: Payer: Self-pay | Admitting: Family Medicine

## 2021-01-21 DIAGNOSIS — E119 Type 2 diabetes mellitus without complications: Secondary | ICD-10-CM

## 2021-05-15 ENCOUNTER — Other Ambulatory Visit: Payer: Self-pay | Admitting: Family Medicine

## 2021-05-15 DIAGNOSIS — E119 Type 2 diabetes mellitus without complications: Secondary | ICD-10-CM

## 2021-06-28 ENCOUNTER — Ambulatory Visit: Payer: Medicare HMO | Admitting: Family Medicine

## 2021-06-30 ENCOUNTER — Encounter: Payer: Self-pay | Admitting: Family Medicine

## 2021-06-30 ENCOUNTER — Other Ambulatory Visit (HOSPITAL_BASED_OUTPATIENT_CLINIC_OR_DEPARTMENT_OTHER): Payer: Self-pay

## 2021-06-30 ENCOUNTER — Ambulatory Visit (INDEPENDENT_AMBULATORY_CARE_PROVIDER_SITE_OTHER): Payer: Medicare HMO | Admitting: Family Medicine

## 2021-06-30 VITALS — BP 148/94 | HR 106 | Temp 98.7°F | Ht 70.0 in | Wt 222.1 lb

## 2021-06-30 DIAGNOSIS — I10 Essential (primary) hypertension: Secondary | ICD-10-CM

## 2021-06-30 DIAGNOSIS — E1165 Type 2 diabetes mellitus with hyperglycemia: Secondary | ICD-10-CM

## 2021-06-30 DIAGNOSIS — B353 Tinea pedis: Secondary | ICD-10-CM

## 2021-06-30 MED ORDER — AMLODIPINE BESYLATE 5 MG PO TABS
5.0000 mg | ORAL_TABLET | Freq: Every day | ORAL | 3 refills | Status: DC
  Filled 2021-06-30: qty 30, 30d supply, fill #0
  Filled 2021-07-26: qty 30, 30d supply, fill #1

## 2021-06-30 MED ORDER — METFORMIN HCL 1000 MG PO TABS
ORAL_TABLET | ORAL | 2 refills | Status: DC
  Filled 2021-06-30: qty 180, 90d supply, fill #0

## 2021-06-30 MED ORDER — KETOCONAZOLE 2 % EX CREA
1.0000 "application " | TOPICAL_CREAM | Freq: Every day | CUTANEOUS | 0 refills | Status: AC
  Filled 2021-06-30: qty 30, 30d supply, fill #0

## 2021-06-30 NOTE — Progress Notes (Signed)
Subjective:  ? ?Chief Complaint  ?Patient presents with  ? Follow-up  ?  Need refills today ?  ? ? ?Jeffrey Austin is a 61 y.o. male here for follow-up of diabetes.   ?Jeffrey Austin does not routinely check his sugars.  ?Patient does not require insulin.   ?Medications include: metformin 1000 mg bid, glipizide 5 mg bid ?Diet is fair.  ?Exercise: walking ? ?Hypertension ?Patient presents for hypertension follow up. ?He does not monitor home blood pressures. ?He is compliant with medications- Hyzaar 100-25 mg/d. ?Patient has these side effects of medication: none ?Diet/exercise as above. ?No CP or SOB.  ? ?Past Medical History:  ?Diagnosis Date  ? Back pain   ? Diabetes mellitus without complication ([place])   ? Hyperlipidemia   ? Hypertension   ?  ? ?Related testing: ?Retinal exam: Done ?Pneumovax: Refused ? ?Objective:  ?BP (!) 148/94 (BP Location: Left Arm, Cuff Size: Normal)   Pulse (!) 106   Temp 98.7 ?F (37.1 ?C) (Oral)   Ht '5\' 10"'$  (1.778 m)   Wt 222 lb 2 oz (100.8 kg)   SpO2 99%   BMI 31.87 kg/m?  ?General:  Well developed, well nourished, in no apparent distress ?Skin:  macerated tissue between 4/5 digits on R; otherwise unremarkable skin; warm, no pallor or diaphoresis ?Head:  Normocephalic, atraumatic ?Eyes:  Pupils equal and round, sclera anicteric without injection  ?Lungs:  CTAB, no access msc use ?Cardio:  RRR, no bruits, no LE edema ?Musculoskeletal:  Symmetrical muscle groups noted without atrophy or deformity ?Neuro:  Sensation intact to pinprick on feet b/l ?Psych: Age appropriate judgment and insight ? ?Assessment:  ? ?Type 2 diabetes mellitus with hyperglycemia, without long-term current use of insulin (HCC) - Plan: metFORMIN (GLUCOPHAGE) 1000 MG tablet, Comprehensive metabolic panel, Lipid panel, Hemoglobin A1c, Microalbumin / creatinine urine ratio ? ?Essential hypertension - Plan: amLODipine (NORVASC) 5 MG tablet ? ?Tinea pedis of right foot - Plan: ketoconazole (NIZORAL) 2 % cream  ? ?Plan:   ? ?Chronic, hopefully stable. Counseled on diet and exercise. Cont metformin 1000 mg bid, glipizide 5 mg bid. ?Chronic, unstable. Cont Hyzaar 100-25 mg/d. Reports he gets nervous w a white coat in view. Monitor BP at home. Add Norvasc 5 mg/d. F/u in 1 mo. ?The patient voiced understanding and agreement to the plan. ? ?Shelda Pal, DO ?06/30/21 ?3:22 PM ? ?

## 2021-06-30 NOTE — Patient Instructions (Signed)
Give [place] 2-3 business days to get the results of your labs back.  ? ?Keep the diet clean and stay active. ? ?Ice/cold pack over area for 10-15 min twice daily. ? ?Heat (pad or rice pillow in microwave) over affected area, 10-15 minutes twice daily.  ? ?Check your blood pressures 2-3 times per week, alternating the time of day you check it. If it is high, considering waiting 1-2 minutes and rechecking. If it gets higher, your anxiety is likely creeping up and we should avoid rechecking.  ? ?Let us know if you need anything. ? ?Trapezius stretches/exercises ?Do exercises exactly as told by your health care provider and adjust them as directed. It is normal to feel mild stretching, pulling, tightness, or discomfort as you do these exercises, but you should stop right away if you feel sudden pain or your pain gets worse.  ? ?Stretching and range of motion exercises ?These exercises warm up your muscles and joints and improve the movement and flexibility of your shoulder. These exercises can also help to relieve pain, numbness, and tingling. If you are unable to do any of the following for any reason, do not further attempt to do it.  ? ?Exercise A: Flexion, standing  ? ?  ?Stand and hold a broomstick, a cane, or a similar object. Place your hands a little more than shoulder-width apart on the object. Your left / right hand should be palm-up, and your other hand should be palm-down. ?Push the stick to raise your left / right arm out to your side and then over your head. Use your other hand to help move the stick. Stop when you feel a stretch in your shoulder, or when you reach the angle that is recommended by your health care provider. ?Avoid shrugging your shoulder while you raise your arm. Keep your shoulder blade tucked down toward your spine. ?Hold for 30 seconds. ?Slowly return to the starting position. ?Repeat 2 times. Complete this exercise 3 times per week. ? ?Exercise B: Abduction, supine  ? ?  ?Lie on your back  and hold a broomstick, a cane, or a similar object. Place your hands a little more than shoulder-width apart on the object. Your left / right hand should be palm-up, and your other hand should be palm-down. ?Push the stick to raise your left / right arm out to your side and then over your head. Use your other hand to help move the stick. Stop when you feel a stretch in your shoulder, or when you reach the angle that is recommended by your health care provider. ?Avoid shrugging your shoulder while you raise your arm. Keep your shoulder blade tucked down toward your spine. ?Hold for 30 seconds. ?Slowly return to the starting position. ?Repeat 2 times. Complete this exercise 3 times per week. ? ?Exercise C: Flexion, active-assisted  ? ?  ?Lie on your back. You may bend your knees for comfort. ?Hold a broomstick, a cane, or a similar object. Place your hands about shoulder-width apart on the object. Your palms should face toward your feet. ?Raise the stick and move your arms over your head and behind your head, toward the floor. Use your healthy arm to help your left / right arm move farther. Stop when you feel a gentle stretch in your shoulder, or when you reach the angle where your health care provider tells you to stop. ?Hold for 30 seconds. ?Slowly return to the starting position. ?Repeat 2 times. Complete this exercise 3 times  per week. ? ?Exercise D: External rotation and abduction  ? ?  ?Stand in a door frame with one of your feet slightly in front of the other. This is called a staggered stance. ?Choose one of the following positions as told by your health care provider: ?Place your hands and forearms on the door frame above your head. ?Place your hands and forearms on the door frame at the height of your head. ?Place your hands on the door frame at the height of your elbows. ?Slowly move your weight onto your front foot until you feel a stretch across your chest and in the front of your shoulders. Keep your  head and chest upright and keep your abdominal muscles tight. ?Hold for 30 seconds. ?To release the stretch, shift your weight to your back foot. ?Repeat 2 times. Complete this stretch 3 times per week. ? ?Strengthening exercises ?These exercises build strength and endurance in your shoulder. Endurance is the ability to use your muscles for a long time, even after your muscles get tired. ?Exercise E: Scapular depression and adduction  ?Sit on a stable chair. Support your arms in front of you with pillows, armrests, or a tabletop. Keep your elbows in line with the sides of your body. ?Gently move your shoulder blades down toward your middle back. Relax the muscles on the tops of your shoulders and in the back of your neck. ?Hold for 3 seconds. ?Slowly release the tension and relax your muscles completely before doing this exercise again. Repeat for a total of 10 repetitions. ?After you have practiced this exercise, try doing the exercise without the arm support. Then, try the exercise while standing instead of sitting. ?Repeat 2 times. Complete this exercise 3 times per week. ? ?Exercise F: Shoulder abduction, isometric  ? ?  ?Stand or sit about 4-6 inches (10-15 cm) from a wall with your left / right side facing the wall. ?Bend your left / right elbow and gently press your elbow against the wall. ?Increase the pressure slowly until you are pressing as hard as you can without shrugging your shoulder. ?Hold for 3 seconds. ?Slowly release the tension and relax your muscles completely. Repeat for a total of 10 repetitions. ?Repeat 2 times. Complete this exercise 3 times per week. ? ?Exercise G: Shoulder flexion, isometric  ? ?  ?Stand or sit about 4-6 inches (10-15 cm) away from a wall with your left / right side facing the wall. ?Keep your left / right elbow straight and gently press the top of your fist against the wall. Increase the pressure slowly until you are pressing as hard as you can without shrugging your  shoulder. ?Hold for 10-15 seconds. ?Slowly release the tension and relax your muscles completely. Repeat for a total of 10 repetitions. ?Repeat 2 times. Complete this exercise 3 times per week. ? ?Exercise H: Internal rotation  ? ?  ?Sit in a stable chair without armrests, or stand. Secure an exercise band at your left / right side, at elbow height. ?Place a soft object, such as a folded towel or a small pillow, under your left / right upper arm so your elbow is a few inches (about 8 cm) away from your side. ?Hold the end of the exercise band so the band stretches. ?Keeping your elbow pressed against the soft object under your arm, move your forearm across your body toward your abdomen. Keep your body steady so the movement is only coming from your shoulder. ?Hold for 3 seconds. ?  Slowly return to the starting position. Repeat for a total of 10 repetitions. ?Repeat 2 times. Complete this exercise 3 times per week. ? ?Exercise I: External rotation  ? ?  ?Sit in a stable chair without armrests, or stand. ?Secure an exercise band at your left / right side, at elbow height. ?Place a soft object, such as a folded towel or a small pillow, under your left / right upper arm so your elbow is a few inches (about 8 cm) away from your side. ?Hold the end of the exercise band so the band stretches. ?Keeping your elbow pressed against the soft object under your arm, move your forearm out, away from your abdomen. Keep your body steady so the movement is only coming from your shoulder. ?Hold for 3 seconds. ?Slowly return to the starting position. Repeat for a total of 10 repetitions. ?Repeat 2 times. Complete this exercise 3 times per week. ?Exercise J: Shoulder extension  ?Sit in a stable chair without armrests, or stand. Secure an exercise band to a stable object in front of you so the band is at shoulder height. ?Hold one end of the exercise band in each hand. Your palms should face each other. ?Straighten your elbows and lift  your hands up to shoulder height. ?Step back, away from the secured end of the exercise band, until the band stretches. ?Squeeze your shoulder blades together and pull your hands down to the sides of your thig

## 2021-07-01 LAB — COMPREHENSIVE METABOLIC PANEL
AG Ratio: 1.4 (calc) (ref 1.0–2.5)
ALT: 52 U/L — ABNORMAL HIGH (ref 9–46)
AST: 47 U/L — ABNORMAL HIGH (ref 10–35)
Albumin: 4.5 g/dL (ref 3.6–5.1)
Alkaline phosphatase (APISO): 65 U/L (ref 35–144)
BUN: 15 mg/dL (ref 7–25)
CO2: 27 mmol/L (ref 20–32)
Calcium: 10.1 mg/dL (ref 8.6–10.3)
Chloride: 92 mmol/L — ABNORMAL LOW (ref 98–110)
Creat: 1.16 mg/dL (ref 0.70–1.30)
Globulin: 3.2 g/dL (calc) (ref 1.9–3.7)
Glucose, Bld: 153 mg/dL — ABNORMAL HIGH (ref 65–99)
Potassium: 4.1 mmol/L (ref 3.5–5.3)
Sodium: 133 mmol/L — ABNORMAL LOW (ref 135–146)
Total Bilirubin: 0.8 mg/dL (ref 0.2–1.2)
Total Protein: 7.7 g/dL (ref 6.1–8.1)

## 2021-07-01 LAB — HEMOGLOBIN A1C
Hgb A1c MFr Bld: 5.8 % of total Hgb — ABNORMAL HIGH (ref <5.7)
Mean Plasma Glucose: 120 mg/dL
eAG (mmol/L): 6.6 mmol/L

## 2021-07-01 LAB — LIPID PANEL
Cholesterol: 161 mg/dL (ref <200)
HDL: 88 mg/dL (ref >=40)
LDL Cholesterol (Calc): 57 mg/dL (calc)
Non-HDL Cholesterol (Calc): 73 mg/dL (calc) (ref <130)
Total CHOL/HDL Ratio: 1.8 (calc) (ref <5.0)
Triglycerides: 78 mg/dL (ref <150)

## 2021-07-01 LAB — MICROALBUMIN / CREATININE URINE RATIO
Creatinine, Urine: 38 mg/dL (ref 20–320)
Microalb Creat Ratio: 1676 mcg/mg creat — ABNORMAL HIGH (ref <30)
Microalb, Ur: 63.7 mg/dL

## 2021-07-24 ENCOUNTER — Other Ambulatory Visit: Payer: Self-pay | Admitting: Family Medicine

## 2021-07-24 DIAGNOSIS — E1165 Type 2 diabetes mellitus with hyperglycemia: Secondary | ICD-10-CM

## 2021-07-26 ENCOUNTER — Other Ambulatory Visit (HOSPITAL_BASED_OUTPATIENT_CLINIC_OR_DEPARTMENT_OTHER): Payer: Self-pay

## 2021-07-31 ENCOUNTER — Other Ambulatory Visit: Payer: Self-pay | Admitting: Family Medicine

## 2021-08-01 ENCOUNTER — Ambulatory Visit: Payer: Medicare HMO | Admitting: Family Medicine

## 2021-08-07 ENCOUNTER — Other Ambulatory Visit: Payer: Self-pay | Admitting: Family Medicine

## 2021-08-07 DIAGNOSIS — I10 Essential (primary) hypertension: Secondary | ICD-10-CM

## 2021-08-07 MED ORDER — AMLODIPINE BESYLATE 5 MG PO TABS: 5.0000 mg | ORAL_TABLET | Freq: Every day | ORAL | 0 refills | Status: DC

## 2021-08-31 ENCOUNTER — Other Ambulatory Visit: Payer: Self-pay | Admitting: Family Medicine

## 2021-08-31 DIAGNOSIS — I10 Essential (primary) hypertension: Secondary | ICD-10-CM

## 2021-09-13 ENCOUNTER — Telehealth: Payer: Self-pay | Admitting: Family Medicine

## 2021-09-13 NOTE — Telephone Encounter (Signed)
Left message for patient to call back and schedule Medicare Annual Wellness Visit (AWV).   Please offer to do virtually or by telephone.  Left office number and my jabber [phone].  AWVI eligible as of 06/03/2017  Please schedule at anytime with Nurse Health Advisor.

## 2021-10-10 ENCOUNTER — Telehealth: Payer: Self-pay | Admitting: Family Medicine

## 2021-10-10 NOTE — Telephone Encounter (Signed)
Pt's spouse dropped off form to be filled out by provider ( Disability Parking Placard 1 page) Pt would like to be called when document ready to pick up at [phone]. Document put at front office tray under providers name.

## 2021-10-10 NOTE — Telephone Encounter (Signed)
PCP signed Put at the front and his wife will pickup

## 2021-11-02 ENCOUNTER — Telehealth: Payer: Self-pay | Admitting: Physical Medicine and Rehabilitation

## 2021-11-02 NOTE — Telephone Encounter (Signed)
Pt called requesting to set an appt to have right knee injection. Pt phone number is [phone].

## 2021-11-07 ENCOUNTER — Ambulatory Visit: Payer: Medicare HMO | Admitting: Physician Assistant

## 2021-11-07 ENCOUNTER — Ambulatory Visit: Payer: Self-pay

## 2021-11-07 ENCOUNTER — Encounter: Payer: Self-pay | Admitting: Physician Assistant

## 2021-11-07 ENCOUNTER — Other Ambulatory Visit (HOSPITAL_BASED_OUTPATIENT_CLINIC_OR_DEPARTMENT_OTHER): Payer: Self-pay

## 2021-11-07 VITALS — Ht 70.0 in

## 2021-11-07 DIAGNOSIS — M25561 Pain in right knee: Secondary | ICD-10-CM | POA: Diagnosis not present

## 2021-11-07 MED ORDER — LIDOCAINE HCL 1 % IJ SOLN
3.0000 mL | INTRAMUSCULAR | Status: AC | PRN
  Administered 2021-11-07: 3 mL

## 2021-11-07 MED ORDER — MELOXICAM 15 MG PO TABS
15.0000 mg | ORAL_TABLET | Freq: Every day | ORAL | 0 refills | Status: DC
  Filled 2021-11-07: qty 30, 30d supply, fill #0

## 2021-11-07 MED ORDER — BUPIVACAINE HCL 0.25 % IJ SOLN
3.0000 mL | INTRAMUSCULAR | Status: AC | PRN
  Administered 2021-11-07: 3 mL via INTRA_ARTICULAR

## 2021-11-07 MED ORDER — METHYLPREDNISOLONE ACETATE 40 MG/ML IJ SUSP
80.0000 mg | INTRAMUSCULAR | Status: AC | PRN
  Administered 2021-11-07: 80 mg via INTRA_ARTICULAR

## 2021-11-07 NOTE — Progress Notes (Signed)
Office Visit Note   Patient: Jeffrey Austin           Date of Birth: [birth year]           MRN: [id number] Visit Date: 11/07/2021              Requested by: Shelda Pal, [address] [place],  [place] [address] PCP: Shelda Pal, DO  Chief Complaint  Patient presents with  . Right Knee - New Patient (Initial Visit)      HPI Jeffrey Austin is a pleasant 61 year old gentleman with a 2-week history of right knee pain and swelling.  Denies any previous history of such.  He does have a long history of low back pain and multiple fusions.  He was previously on chronic pain management but is weaned off oxycodone.  Has not had pain management a couple years.  He states he tripped over a sidewalk while in [place] couple weeks ago.  Has had swelling and pain especially on the medial side of his right knee since.  Denies any valgus or twisting just impact on his right knee when he tried to stop from falling 1. Acute pain of right knee     Plan: Do not see any acute fractures on x-ray minimal arthritic changes.  He is focally tender over the posterior medial joint line concerned about a meniscus tear and especially in light of his swelling.  Did attempt to aspirate fluid today was unable to do so.  Did put some steroid and local and he felt like the bupivacaine did help his pain.  We will order an MRI given the severity of his symptoms and location.  Should follow-up afterwards.  Also wrote him a prescription for meloxicam and discussed topical Voltaren gel.  He also has a lot of varicosities and because of this is not very active have asked that he take a baby aspirin a day to prevent any clotting no history of blood clots also flexion extension of his ankles  Follow-Up Instructions:    Ortho Exam  Patient is alert, oriented, no adenopathy, well-dressed, normal affect, normal respiratory effort. Examination of his right knee he does have some mild to  moderate soft tissue swelling may be a small amount of fluid.  No redness no erythema of the compartments of the lower extremity are soft and nontender.  No tenderness laterally good varus valgus stability he is focally quite tender over the posterior medial joint line  Imaging: No results found. No images are attached to the encounter.  Labs: Lab Results  Component Value Date   HGBA1C 5.8 (H) 06/30/2021   HGBA1C 6.6 (H) 11/28/2018   HGBA1C 7.1 (H) 08/26/2018     Lab Results  Component Value Date   ALBUMIN 4.3 11/28/2018   ALBUMIN 4.4 08/26/2018   ALBUMIN 4.2 12/20/2017    No results found for: "MG" No results found for: "VD25OH"  No results found for: "PREALBUMIN"    Latest Ref Rng & Units 08/26/2018   10:17 AM 12/20/2017   10:20 AM 01/30/2016   11:20 AM  CBC EXTENDED  WBC 4.0 - 10.5 K/uL 8.0  7.4  8.3   RBC 4.22 - 5.81 Mil/uL 4.80  4.89  4.87   Hemoglobin 13.0 - 17.0 g/dL 16.3  16.6  16.6   HCT 39.0 - 52.0 % 46.8  48.0  46.6   Platelets 150.0 - 400.0 K/uL 133.0  128.0  150  Body mass index is 31.87 kg/m.  Orders:  Orders Placed This Encounter  Procedures  . XR KNEE 3 VIEW RIGHT  . MR Knee Right w/o contrast   Meds ordered this encounter  Medications  . meloxicam (MOBIC) 15 MG tablet    Sig: Take 1 tablet (15 mg total) by mouth daily.    Dispense:  30 tablet    Refill:  0     Procedures: Large Joint Inj on 11/07/2021 11:41 AM Indications: pain and diagnostic evaluation Details: 25 G 1.5 in needle, superior approach  Arthrogram: No  Medications: 80 mg methylPREDNISolone acetate 40 MG/ML; 3 mL lidocaine 1 %; 3 mL bupivacaine 0.25 % Outcome: tolerated well, no immediate complications  After obtaining verbal consent the superior medial aspect of his knee was sterilely prepped with alcohol and Betadine.  3 cc of lidocaine plain was injected.  After adequate anesthetic attempted aspiration was made could only aspirate 1 to 2 cc of blood.  Following this  was injected with 80 cc of Depo-Medrol and bupivacaine.  Patient did have significant relief in his pain from the bupivacaine.  Band-Aid was applied. Procedure, treatment alternatives, risks and benefits explained, specific risks discussed. Consent was given by the patient. Immediately prior to procedure a time out was called to verify the correct patient, procedure, equipment, support staff and site/side marked as required. Patient was prepped and draped in the usual sterile fashion.    Clinical Data: No additional findings.  ROS:  All other systems negative, except as noted in the HPI. Review of Systems  Objective: Vital Signs: Ht '5\' 10"'$  (1.778 m)   BMI 31.87 kg/m   Specialty Comments:  No specialty comments available.  PMFS History: Patient Active Problem List   Diagnosis Date Noted  . Chronic low back pain with bilateral sciatica 04/22/2017  . Postlaminectomy syndrome, lumbar region 04/22/2017  . Essential hypertension 03/10/2017  . Tobacco abuse 03/10/2017  . Type 2 diabetes mellitus with hyperglycemia, without long-term current use of insulin ([place]) 03/10/2017   Past Medical History:  Diagnosis Date  . Back pain   . Diabetes mellitus without complication ([place])   . Hyperlipidemia   . Hypertension     Family History  Problem Relation Age of Onset  . Leukemia Father   . Colon cancer Neg Hx   . Esophageal cancer Neg Hx   . Rectal cancer Neg Hx   . Stomach cancer Neg Hx     Past Surgical History:  Procedure Laterality Date  . cyst removed from testicle     . INCISIONAL HERNIA REPAIR N/A 02/01/2016   Procedure: LAPAROSCOPIC INCISIONAL HERNIA REPAIR;  Surgeon: Arta Bruce Kinsinger, MD;  Location: [facility];  Service: General;  Laterality: N/A;  . INSERTION OF MESH N/A 02/01/2016   Procedure: INSERTION OF MESH;  Surgeon: Arta Bruce Kinsinger, MD;  Location: [facility];  Service: General;  Laterality: N/A;  . LUMBAR FUSION    . SPINAL FUSION     Social History    Occupational History  . Not on file  Tobacco Use  . Smoking status: Some Days    Packs/day: 0.25    Types: Cigarettes  . Smokeless tobacco: Never  Vaping Use  . Vaping Use: Never used  Substance and Sexual Activity  . Alcohol use: Yes    Alcohol/week: 5.0 - 10.0 standard drinks of alcohol    Types: 5 - 10 Standard drinks or equivalent per week  . Drug use: No  . Sexual activity: Yes

## 2021-11-14 ENCOUNTER — Telehealth: Payer: Self-pay | Admitting: Physician Assistant

## 2021-11-14 NOTE — Telephone Encounter (Signed)
Called pt 1X and left vm for pt to call and set an appt with PA Persons to go over MRI after 11/18/21

## 2021-11-17 ENCOUNTER — Telehealth: Payer: Self-pay | Admitting: Physician Assistant

## 2021-11-17 NOTE — Telephone Encounter (Signed)
Called pt 2X and left a vm for pt to set an MRI review appt with PA Persons after 11/18/21

## 2021-11-18 ENCOUNTER — Ambulatory Visit
Admission: RE | Admit: 2021-11-18 | Discharge: 2021-11-18 | Disposition: A | Discharge status: Home / Self Care | Payer: Medicare HMO | Source: Ambulatory Visit | Attending: Physician Assistant | Admitting: Physician Assistant

## 2021-11-18 DIAGNOSIS — M7121 Synovial cyst of popliteal space [Baker], right knee: Secondary | ICD-10-CM | POA: Diagnosis not present

## 2021-11-18 DIAGNOSIS — M25561 Pain in right knee: Secondary | ICD-10-CM

## 2021-11-18 DIAGNOSIS — S83231A Complex tear of medial meniscus, current injury, right knee, initial encounter: Secondary | ICD-10-CM | POA: Diagnosis not present

## 2021-11-18 DIAGNOSIS — M25461 Effusion, right knee: Secondary | ICD-10-CM | POA: Diagnosis not present

## 2021-11-29 ENCOUNTER — Encounter: Payer: Self-pay | Admitting: Orthopaedic Surgery

## 2021-11-29 ENCOUNTER — Ambulatory Visit: Payer: Medicare HMO | Admitting: Orthopaedic Surgery

## 2021-11-29 DIAGNOSIS — M25561 Pain in right knee: Secondary | ICD-10-CM

## 2021-11-29 NOTE — Progress Notes (Signed)
Office Visit Note   Patient: Jeffrey Austin           Date of Birth: 1960/09/29           MRN: [id number] Visit Date: 11/29/2021              Requested by: Shelda Pal, [address] [place],  [place] [address] PCP: Shelda Pal, DO   Assessment & Plan: Visit Diagnoses:  1. Acute pain of right knee   2. Right knee pain, unspecified chronicity     Plan: [name]  is a pleasant 61 year old gentleman that has had ongoing difficulties with his right knee.  He first injured this when he was stepping off uneven sidewalk when he was in [place] walking and placed all his weight awkwardly onto his right knee.  This occurred about 5 weeks ago.  He did have some swelling.  He was seen and evaluated and given a steroid injection.  He said that this helped him for about a week.  He has never had problems with his knee in the past and is an avid walker.  He had specific pain over the posterior medial aspect of his knee and trouble flexing his knee.  An MRI was ordered he is here to review it today.  The MRI does show tricompartmental arthritis especially in the patellofemoral joint and lateral joint.  Partial-thickness cartilage loss in the medial joint.  He does have a complex tear of the body of the posterior horn of the medial meniscus.  He has not had problem with the arthritis in the past and is very specific to where his pain is and on exam he is acutely tender over the posterior medial joint line in the area of the meniscus tear.  This is the pain that is most bothersome to him and he is unable to walk.  Based on this he could be a candidate for knee arthroscopy and medial meniscectomy.  He understands that this will not affect the arthritic changes in his knee but hopefully since he did not have much problems prior he would have good relief with a simple arthroscopy.  We will refer him to Dr. Marlou Sa for surgical discussion. I do think that the complex tear  of the medial meniscus is symptomatic.  Follow-Up Instructions: Return in about 1 week (around 12/06/2021), or Surgical discussion with Dr. Marlou Sa.   Orders:  Orders Placed This Encounter  Procedures   Ambulatory referral to Orthopedic Surgery   No orders of the defined types were placed in this encounter.     Procedures: No procedures performed   Clinical Data: No additional findings.   Subjective: Chief Complaint  Patient presents with   Right Knee - Follow-up    MRI review  Patient presents today for follow up on his right knee. He had an MRI and is here today for those results.  He will experiencing pain along the posterior medial joint line of the right knee with this associated swelling.  Was not having any pain before he injured his knee while walking in [place] about 5 weeks ago and he has not responded to cortisone in time and is compromised his ability to ambulate    Review of Systems  All other systems reviewed and are negative.    Objective: Vital Signs: There were no vitals taken for this visit.  Physical Exam Vitals reviewed.  Constitutional:      Appearance: Normal appearance.  Pulmonary:     Effort: Pulmonary effort is normal.  Skin:    General: Skin is warm and dry.  Neurological:     Mental Status: He is alert.     Ortho Exam Right knee he has no warmth today.  He does have a small amount of fluid.  No redness.  He does have quite a bit of crepitus with range of motion of his knee in the patellofemoral joint though this is not painful to him.  He has no tenderness on the lateral joint line.  On the anterior medial joint line no particular tenderness.  He has acute tenderness over the posterior medial joint line to palpation.  He also has decreased flexion secondary to this pain compartments are soft and nontender Specialty Comments:  No specialty comments available.  Imaging: No results found.   PMFS History: Patient Active Problem List    Diagnosis Date Noted   Pain in right knee 11/29/2021   Chronic low back pain with bilateral sciatica 04/22/2017   Postlaminectomy syndrome, lumbar region 04/22/2017   Essential hypertension 03/10/2017   Tobacco abuse 03/10/2017   Type 2 diabetes mellitus with hyperglycemia, without long-term current use of insulin ([facility]) 03/10/2017   Past Medical History:  Diagnosis Date   Back pain    Diabetes mellitus without complication ([place])    Hyperlipidemia    Hypertension     Family History  Problem Relation Age of Onset   Leukemia Father    Colon cancer Neg Hx    Esophageal cancer Neg Hx    Rectal cancer Neg Hx    Stomach cancer Neg Hx     Past Surgical History:  Procedure Laterality Date   cyst removed from testicle      INCISIONAL HERNIA REPAIR N/A 02/01/2016   Procedure: LAPAROSCOPIC INCISIONAL HERNIA REPAIR;  Surgeon: Arta Bruce Kinsinger, MD;  Location: [facility];  Service: General;  Laterality: N/A;   INSERTION OF MESH N/A 02/01/2016   Procedure: INSERTION OF MESH;  Surgeon: Arta Bruce Kinsinger, MD;  Location: [facility];  Service: General;  Laterality: N/A;   LUMBAR FUSION     SPINAL FUSION     Social History   Occupational History   Not on file  Tobacco Use   Smoking status: Some Days    Packs/day: 0.25    Types: Cigarettes   Smokeless tobacco: Never  Vaping Use   Vaping Use: Never used  Substance and Sexual Activity   Alcohol use: Yes    Alcohol/week: 5.0 - 10.0 standard drinks of alcohol    Types: 5 - 10 Standard drinks or equivalent per week   Drug use: No   Sexual activity: Yes

## 2021-12-15 ENCOUNTER — Ambulatory Visit: Payer: Medicare HMO | Admitting: Orthopedic Surgery

## 2021-12-15 DIAGNOSIS — S83241D Other tear of medial meniscus, current injury, right knee, subsequent encounter: Secondary | ICD-10-CM | POA: Diagnosis not present

## 2021-12-17 ENCOUNTER — Encounter: Payer: Self-pay | Admitting: Orthopedic Surgery

## 2021-12-17 NOTE — Progress Notes (Signed)
Office Visit Note   Patient: Jeffrey Austin           Date of Birth: [birth year]           MRN: [id number] Visit Date: 12/15/2021 Requested by: Garald Balding, [address],  [place] [address] PCP: Shelda Pal, DO  Subjective: Chief Complaint  Patient presents with   Right Knee - Pain    HPI: Jeffrey Austin is a 61 y.o. male who presents to the office reporting .  Right knee pain.  He has had pain since the end of August.  He fell and landed on his knee while visiting his family in [place].  Walks 3 miles a day to help his back.  He has had spinal fusion x3.  He did have an injection which helped him for about 3 to 4 days.  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  Going to [place] in the beginning of December.  No relief from ibuprofen or Tylenol.  MRI scan demonstrates complex tear of the posterior horn of the meniscus.  Also has a little bit of edema along the medial collateral ligament but the ligament itself is not torn.  There is some patellofemoral arthritis.              ROS: All systems reviewed are negative as they relate to the chief complaint within the history of present illness.  Patient denies fevers or chills.  Assessment & Plan: Visit Diagnoses: No diagnosis found.  Plan: Impression is right knee medial meniscal tear.  Failed conservative treatment.  Does not look like it is radicular nature.  Patient does need to be able to walk in order to rehabilitate his back.  He was walking 3 miles a day but now really cannot walk much at all.  Plan is arthroscopy and debridement.  The risk and benefits of the procedure discussed with the patient including not limited to infection nerve vessel damage incomplete pain relief as well as potential need for further surgery if arthritis is found to be the potentially predominant pain generator.  Follow-Up Instructions: No follow-ups on file.   Orders:  No orders of  the defined types were placed in this encounter.  No orders of the defined types were placed in this encounter.     Procedures: No procedures performed   Clinical Data: No additional findings.  Objective: Vital Signs: There were no vitals taken for this visit.  Physical Exam:  Constitutional: Patient appears well-developed HEENT:  Head: Normocephalic Eyes:EOM are normal Neck: Normal range of motion Cardiovascular: Normal rate Pulmonary/chest: Effort normal Neurologic: Patient is alert Skin: Skin is warm Psychiatric: Patient has normal mood and affect  Ortho Exam: Ortho exam demonstrates trace right knee effusion.  Medial joint line tenderness is present.  McMurray compression testing is positive.  Collateral crucial ligaments are stable.  Pedal pulses palpable.  No nerve root tension signs.  Specialty Comments:  No specialty comments available.  Imaging: No results found.   PMFS History: Patient Active Problem List   Diagnosis Date Noted   Pain in right knee 11/29/2021   Chronic low back pain with bilateral sciatica 04/22/2017   Postlaminectomy syndrome, lumbar region 04/22/2017   Essential hypertension 03/10/2017   Tobacco abuse 03/10/2017   Type 2 diabetes mellitus with hyperglycemia, without long-term current use of insulin ([place]) 03/10/2017   Past Medical History:  Diagnosis Date   Back pain  Diabetes mellitus without complication ([place])    Hyperlipidemia    Hypertension     Family History  Problem Relation Age of Onset   Leukemia Father    Colon cancer Neg Hx    Esophageal cancer Neg Hx    Rectal cancer Neg Hx    Stomach cancer Neg Hx     Past Surgical History:  Procedure Laterality Date   cyst removed from testicle      INCISIONAL HERNIA REPAIR N/A 02/01/2016   Procedure: LAPAROSCOPIC INCISIONAL HERNIA REPAIR;  Surgeon: Arta Bruce Kinsinger, MD;  Location: [facility];  Service: General;  Laterality: N/A;   INSERTION OF MESH N/A 02/01/2016    Procedure: INSERTION OF MESH;  Surgeon: Arta Bruce Kinsinger, MD;  Location: [facility];  Service: General;  Laterality: N/A;   LUMBAR FUSION     SPINAL FUSION     Social History   Occupational History   Not on file  Tobacco Use   Smoking status: Some Days    Packs/day: 0.25    Types: Cigarettes   Smokeless tobacco: Never  Vaping Use   Vaping Use: Never used  Substance and Sexual Activity   Alcohol use: Yes    Alcohol/week: 5.0 - 10.0 standard drinks of alcohol    Types: 5 - 10 Standard drinks or equivalent per week   Drug use: No   Sexual activity: Yes

## 2021-12-18 ENCOUNTER — Other Ambulatory Visit: Payer: Self-pay

## 2021-12-21 NOTE — Pre-Procedure Instructions (Signed)
Surgical Instructions    Your procedure is scheduled on Thursday, December 28, 2021 at 3:31 PM.  Report to [facility] Main Entrance "A" at 1:30 P.M., then check in with the Admitting office.  Call this number if you have problems the morning of surgery:  [phone]   If you have any questions prior to your surgery date call [phone]: Open Monday-Friday 8am-4pm  *If you experience any cold or flu symptoms such as cough, fever, chills, shortness of breath, etc. between now and your scheduled surgery, please notify us.*    Remember:  Do not eat after midnight the night before your surgery  You may drink clear liquids until 12:30 PM the day of your surgery.   Clear liquids allowed are: Water, Non-Citrus Juices (without pulp), Carbonated Beverages, Clear Tea, Black Coffee Only (NO MILK, CREAM OR POWDERED CREAMER of any kind), and Gatorade.   Enhanced Recovery after Surgery for Orthopedics Enhanced Recovery after Surgery is a protocol used to improve the stress on your body and your recovery after surgery.  Patient Instructions  The day of surgery (if you have diabetes):  Drink ONE bottle of G2 by 12:30 pm the morning of surgery This bottle was given to you during your hospital  pre-op appointment visit.  Nothing else to drink after completing the  bottle of G2.         If you have questions, please contact your surgeon's office.     Take these medicines the morning of surgery with A SIP OF WATER:  amLODipine (NORVASC) atorvastatin (LIPITOR)  As of today, STOP taking any Aspirin (unless otherwise instructed by your surgeon) Aleve, Naproxen, Ibuprofen, Motrin, Advil, Goody's, BC's, all herbal medications, fish oil, and all vitamins.  WHAT DO I DO ABOUT MY DIABETES MEDICATION?   Do not take metFORMIN (GLUCOPHAGE) the morning of surgery.  Do not take your evening dose of glipiZIDE (GLUCOTROL) on (10/25) or the your morning dose the day of surgery (10/26).   HOW TO  MANAGE YOUR DIABETES BEFORE AND AFTER SURGERY  Why is it important to control my blood sugar before and after surgery? Improving blood sugar levels before and after surgery helps healing and can limit problems. A way of improving blood sugar control is eating a healthy diet by:  Eating less sugar and carbohydrates  Increasing activity/exercise  Talking with your doctor about reaching your blood sugar goals High blood sugars (greater than 180 mg/dL) can raise your risk of infections and slow your recovery, so you will need to focus on controlling your diabetes during the weeks before surgery. Make sure that the doctor who takes care of your diabetes knows about your planned surgery including the date and location.  How do I manage my blood sugar before surgery? Check your blood sugar at least 4 times a day, starting 2 days before surgery, to make sure that the level is not too high or low.  Check your blood sugar the morning of your surgery when you wake up and every 2 hours until you get to the Short Stay unit.  If your blood sugar is less than 70 mg/dL, you will need to treat for low blood sugar: Do not take insulin. Treat a low blood sugar (less than 70 mg/dL) with  cup of clear juice (cranberry or apple), 4 glucose tablets, OR glucose gel. Recheck blood sugar in 15 minutes after treatment (to make sure it is greater than 70 mg/dL). If your blood sugar is not greater than  70 mg/dL on recheck, call [phone] for further instructions. Report your blood sugar to the short stay nurse when you get to Short Stay.  If you are admitted to the hospital after surgery: Your blood sugar will be checked by the staff and you will probably be given insulin after surgery (instead of oral diabetes medicines) to make sure you have good blood sugar levels. The goal for blood sugar control after surgery is 80-180 mg/dL.                      Do NOT Smoke (Tobacco/Vaping) for 24 hours prior to your  procedure.  If you use a CPAP at night, you may bring your mask/headgear for your overnight stay.   Contacts, glasses, piercing's, hearing aid's, dentures or partials may not be worn into surgery, please bring cases for these belongings.    For patients admitted to the hospital, discharge time will be determined by your treatment team.   Patients discharged the day of surgery will not be allowed to drive home, and someone needs to stay with them for 24 hours.  SURGICAL WAITING ROOM VISITATION Patients having surgery or a procedure may have two support people in the waiting area. Visitors may stay in the waiting area during the procedure and switch out with other visitors if needed. Children under the age of 84 must have an adult accompany them who is not the patient. If the patient needs to stay at the hospital during part of their recovery, the visitor guidelines for inpatient rooms apply.  Please refer to the [facility] website for the visitor guidelines for Inpatients (after your surgery is over and you are in a regular room).    Special instructions:   [place]- Preparing For Surgery  Before surgery, you can play an important role. Because skin is not sterile, your skin needs to be as free of germs as possible. You can reduce the number of germs on your skin by washing with CHG (chlorahexidine gluconate) Soap before surgery.  CHG is an antiseptic cleaner which kills germs and bonds with the skin to continue killing germs even after washing.    Oral Hygiene is also important to reduce your risk of infection.  Remember - BRUSH YOUR TEETH THE MORNING OF SURGERY WITH YOUR REGULAR TOOTHPASTE  Please do not use if you have an allergy to CHG or antibacterial soaps. If your skin becomes reddened/irritated stop using the CHG.  Do not shave (including legs and underarms) for at least 48 hours prior to first CHG shower. It is OK to shave your face.  Please follow these instructions  carefully.   Shower the NIGHT BEFORE SURGERY and the MORNING OF SURGERY  If you chose to wash your hair, wash your hair first as usual with your normal shampoo.  After you shampoo, rinse your hair and body thoroughly to remove the shampoo.  Use CHG Soap as you would any other liquid soap. You can apply CHG directly to the skin and wash gently with a scrungie or a clean washcloth.   Apply the CHG Soap to your body ONLY FROM THE NECK DOWN.  Do not use on open wounds or open sores. Avoid contact with your eyes, ears, mouth and genitals (private parts). Wash Face and genitals (private parts)  with your normal soap.   Wash thoroughly, paying special attention to the area where your surgery will be performed.  Thoroughly rinse your body with warm water from the neck  down.  DO NOT shower/wash with your normal soap after using and rinsing off the CHG Soap.  Pat yourself dry with a CLEAN TOWEL.  Wear CLEAN PAJAMAS to bed the night before surgery  Place CLEAN SHEETS on your bed the night before your surgery  DO NOT SLEEP WITH PETS.   Day of Surgery: Take a shower with CHG soap. Do not wear jewelry. Do not wear lotions, powders, perfumes/colognes, or deodorant. Do not shave 48 hours prior to surgery.  Men may shave face and neck. Do not bring valuables to the hospital.  [facility] is not responsible for any belongings or valuables. Wear Clean/Comfortable clothing the morning of surgery Do not apply any deodorants/lotions.   Remember to brush your teeth WITH YOUR REGULAR TOOTHPASTE.   Please read over the following fact sheets that you were given.  If you received a COVID test during your pre-op visit  it is requested that you wear a mask when out in public, stay away from anyone that may not be feeling well and notify your surgeon if you develop symptoms. If you have been in contact with anyone that has tested positive in the last 10 days please notify you surgeon.

## 2021-12-22 ENCOUNTER — Encounter (HOSPITAL_COMMUNITY): Payer: Self-pay

## 2021-12-22 ENCOUNTER — Other Ambulatory Visit: Payer: Self-pay

## 2021-12-22 ENCOUNTER — Encounter (HOSPITAL_COMMUNITY)
Admission: RE | Admit: 2021-12-22 | Discharge: 2021-12-22 | Disposition: A | Discharge status: Home / Self Care | Payer: Medicare HMO | Source: Ambulatory Visit | Attending: Orthopedic Surgery | Admitting: Orthopedic Surgery

## 2021-12-22 VITALS — BP 164/95 | HR 97 | Temp 98.2°F | Resp 18 | Ht 70.0 in | Wt 224.5 lb

## 2021-12-22 DIAGNOSIS — Z01818 Encounter for other preprocedural examination: Secondary | ICD-10-CM | POA: Insufficient documentation

## 2021-12-22 DIAGNOSIS — E1165 Type 2 diabetes mellitus with hyperglycemia: Secondary | ICD-10-CM | POA: Insufficient documentation

## 2021-12-22 LAB — HEMOGLOBIN A1C
Hgb A1c MFr Bld: 5.6 % (ref 4.8–5.6)
Mean Plasma Glucose: 114.02 mg/dL

## 2021-12-22 LAB — BASIC METABOLIC PANEL
Anion gap: 13 (ref 5–15)
BUN: 17 mg/dL (ref 6–20)
CO2: 27 mmol/L (ref 22–32)
Calcium: 9.9 mg/dL (ref 8.9–10.3)
Chloride: 93 mmol/L — ABNORMAL LOW (ref 98–111)
Creatinine, Ser: 1.25 mg/dL — ABNORMAL HIGH (ref 0.61–1.24)
GFR, Estimated: >60 mL/min (ref >60)
Glucose, Bld: 127 mg/dL — ABNORMAL HIGH (ref 70–99)
Potassium: 3.9 mmol/L (ref 3.5–5.1)
Sodium: 133 mmol/L — ABNORMAL LOW (ref 135–145)

## 2021-12-22 LAB — CBC
HCT: 40.7 % (ref 39.0–52.0)
Hemoglobin: 14.7 g/dL (ref 13.0–17.0)
MCH: 35.1 pg — ABNORMAL HIGH (ref 26.0–34.0)
MCHC: 36.1 g/dL — ABNORMAL HIGH (ref 30.0–36.0)
MCV: 97.1 fL (ref 80.0–100.0)
Platelets: 177 10*3/uL (ref 150–400)
RBC: 4.19 MIL/uL — ABNORMAL LOW (ref 4.22–5.81)
RDW: 12.6 % (ref 11.5–15.5)
WBC: 6.8 10*3/uL (ref 4.0–10.5)
nRBC: 0 % (ref 0.0–0.2)

## 2021-12-22 LAB — SURGICAL PCR SCREEN
MRSA, PCR: NEGATIVE
Staphylococcus aureus: NEGATIVE

## 2021-12-22 NOTE — Progress Notes (Signed)
PCP - Dr. Riki Sheer Cardiologist - Denies  PPM/ICD - Denies  Chest x-ray - N/A EKG - 12/22/21 Stress Test - Denies ECHO - Denies Cardiac Cath - Denies  Sleep Study - Denies  Patient doesn't check his CBGs. Takes metformin and glipizide.   Blood Thinner Instructions: N/A Aspirin Instructions: N/A  ERAS Protcol - Yes, G2   COVID TEST- N/A   Anesthesia review: No  Patient denies shortness of breath, fever, cough and chest pain at PAT appointment   All instructions explained to the patient, with a verbal understanding of the material. Patient agrees to go over the instructions while at home for a better understanding. Patient also instructed to self quarantine after being tested for COVID-19. The opportunity to ask questions was provided.

## 2021-12-28 ENCOUNTER — Ambulatory Visit (HOSPITAL_COMMUNITY)
Admission: RE | Admit: 2021-12-28 | Discharge: 2021-12-28 | Disposition: A | Discharge status: Home / Self Care | Payer: Medicare HMO | Attending: Orthopedic Surgery | Admitting: Orthopedic Surgery

## 2021-12-28 ENCOUNTER — Ambulatory Visit (HOSPITAL_COMMUNITY): Payer: Medicare HMO | Admitting: Anesthesiology

## 2021-12-28 ENCOUNTER — Encounter (HOSPITAL_COMMUNITY): Payer: Self-pay | Admitting: Orthopedic Surgery

## 2021-12-28 ENCOUNTER — Ambulatory Visit (HOSPITAL_BASED_OUTPATIENT_CLINIC_OR_DEPARTMENT_OTHER): Payer: Medicare HMO | Admitting: Anesthesiology

## 2021-12-28 ENCOUNTER — Encounter (HOSPITAL_COMMUNITY)
Admission: RE | Disposition: A | Discharge status: Home / Self Care | Payer: Self-pay | Source: Home / Self Care | Attending: Orthopedic Surgery

## 2021-12-28 DIAGNOSIS — S83241A Other tear of medial meniscus, current injury, right knee, initial encounter: Secondary | ICD-10-CM

## 2021-12-28 DIAGNOSIS — Z981 Arthrodesis status: Secondary | ICD-10-CM | POA: Insufficient documentation

## 2021-12-28 DIAGNOSIS — S83281A Other tear of lateral meniscus, current injury, right knee, initial encounter: Secondary | ICD-10-CM

## 2021-12-28 DIAGNOSIS — F17210 Nicotine dependence, cigarettes, uncomplicated: Secondary | ICD-10-CM | POA: Insufficient documentation

## 2021-12-28 DIAGNOSIS — S83281D Other tear of lateral meniscus, current injury, right knee, subsequent encounter: Secondary | ICD-10-CM | POA: Diagnosis not present

## 2021-12-28 DIAGNOSIS — E119 Type 2 diabetes mellitus without complications: Secondary | ICD-10-CM | POA: Diagnosis not present

## 2021-12-28 DIAGNOSIS — I10 Essential (primary) hypertension: Secondary | ICD-10-CM | POA: Diagnosis not present

## 2021-12-28 DIAGNOSIS — W19XXXA Unspecified fall, initial encounter: Secondary | ICD-10-CM | POA: Diagnosis not present

## 2021-12-28 DIAGNOSIS — Z7984 Long term (current) use of oral hypoglycemic drugs: Secondary | ICD-10-CM | POA: Diagnosis not present

## 2021-12-28 DIAGNOSIS — Z01818 Encounter for other preprocedural examination: Secondary | ICD-10-CM

## 2021-12-28 HISTORY — PX: KNEE ARTHROSCOPY: SHX127

## 2021-12-28 LAB — GLUCOSE, CAPILLARY: Glucose-Capillary: 170 mg/dL — ABNORMAL HIGH (ref 70–99)

## 2021-12-28 SURGERY — ARTHROSCOPY, KNEE
Anesthesia: General | Site: Knee | Laterality: Right

## 2021-12-28 MED ORDER — ROCURONIUM BROMIDE 10 MG/ML (PF) SYRINGE
PREFILLED_SYRINGE | INTRAVENOUS | Status: DC | PRN
  Administered 2021-12-28: 30 mg via INTRAVENOUS
  Administered 2021-12-28: 20 mg via INTRAVENOUS
  Administered 2021-12-28: 50 mg via INTRAVENOUS

## 2021-12-28 MED ORDER — CLONIDINE HCL (ANALGESIA) 100 MCG/ML EP SOLN
EPIDURAL | Status: DC | PRN
  Administered 2021-12-28: 1 mL

## 2021-12-28 MED ORDER — FENTANYL CITRATE (PF) 250 MCG/5ML IJ SOLN
INTRAMUSCULAR | Status: DC | PRN
  Administered 2021-12-28: 100 ug via INTRAVENOUS

## 2021-12-28 MED ORDER — ONDANSETRON HCL 4 MG/2ML IJ SOLN: 4.0000 mg | Freq: Four times a day (QID) | INTRAMUSCULAR | Status: DC | PRN

## 2021-12-28 MED ORDER — EPINEPHRINE PF 1 MG/ML IJ SOLN
INTRAMUSCULAR | Status: AC
  Filled 2021-12-28: qty 2

## 2021-12-28 MED ORDER — PROPOFOL 10 MG/ML IV BOLUS
INTRAVENOUS | Status: DC | PRN
  Administered 2021-12-28: 200 mg via INTRAVENOUS

## 2021-12-28 MED ORDER — CEFAZOLIN SODIUM-DEXTROSE 2-4 GM/100ML-% IV SOLN
2.0000 g | INTRAVENOUS | Status: AC
  Administered 2021-12-28: 2 g via INTRAVENOUS

## 2021-12-28 MED ORDER — CHLORHEXIDINE GLUCONATE 0.12 % MT SOLN: 15.0000 mL | Freq: Once | OROMUCOSAL | Status: AC

## 2021-12-28 MED ORDER — SODIUM CHLORIDE 0.9 % IR SOLN
Status: DC | PRN
  Administered 2021-12-28: 1000 mL

## 2021-12-28 MED ORDER — OXYCODONE HCL 5 MG PO TABS: 5.0000 mg | ORAL_TABLET | Freq: Once | ORAL | Status: DC | PRN

## 2021-12-28 MED ORDER — OXYCODONE HCL 5 MG/5ML PO SOLN: 5.0000 mg | Freq: Once | ORAL | Status: DC | PRN

## 2021-12-28 MED ORDER — EPINEPHRINE PF 1 MG/ML IJ SOLN
INTRAMUSCULAR | Status: DC | PRN
  Administered 2021-12-28 (×2): 1 mg

## 2021-12-28 MED ORDER — OXYCODONE-ACETAMINOPHEN 5-325 MG PO TABS: 1.0000 | ORAL_TABLET | ORAL | 0 refills | Status: DC | PRN

## 2021-12-28 MED ORDER — CHLORHEXIDINE GLUCONATE 0.12 % MT SOLN
OROMUCOSAL | Status: AC
  Administered 2021-12-28: 15 mL via OROMUCOSAL
  Filled 2021-12-28: qty 15

## 2021-12-28 MED ORDER — BUPIVACAINE-EPINEPHRINE 0.25% -1:200000 IJ SOLN
INTRAMUSCULAR | Status: DC | PRN
  Administered 2021-12-28: 20 mL
  Administered 2021-12-28: 10 mL

## 2021-12-28 MED ORDER — LACTATED RINGERS IV SOLN: INTRAVENOUS | Status: DC

## 2021-12-28 MED ORDER — KETAMINE HCL 10 MG/ML IJ SOLN
INTRAMUSCULAR | Status: DC | PRN
  Administered 2021-12-28: 25 mg via INTRAVENOUS

## 2021-12-28 MED ORDER — LABETALOL HCL 5 MG/ML IV SOLN
INTRAVENOUS | Status: AC
  Filled 2021-12-28: qty 4

## 2021-12-28 MED ORDER — SODIUM CHLORIDE 0.9 % IR SOLN
Status: DC | PRN
  Administered 2021-12-28 (×2): 3000 mL

## 2021-12-28 MED ORDER — LIDOCAINE 2% (20 MG/ML) 5 ML SYRINGE
INTRAMUSCULAR | Status: DC | PRN
  Administered 2021-12-28: 60 mg via INTRAVENOUS

## 2021-12-28 MED ORDER — FENTANYL CITRATE (PF) 250 MCG/5ML IJ SOLN
INTRAMUSCULAR | Status: AC
  Filled 2021-12-28: qty 5

## 2021-12-28 MED ORDER — METHOCARBAMOL 500 MG PO TABS: 500.0000 mg | ORAL_TABLET | Freq: Three times a day (TID) | ORAL | 1 refills | Status: DC | PRN

## 2021-12-28 MED ORDER — POVIDONE-IODINE 10 % EX SWAB: 2.0000 | Freq: Once | CUTANEOUS | Status: DC

## 2021-12-28 MED ORDER — INSULIN ASPART 100 UNIT/ML IJ SOLN: 0.0000 [IU] | INTRAMUSCULAR | Status: DC | PRN

## 2021-12-28 MED ORDER — INSULIN ASPART 100 UNIT/ML IJ SOLN
INTRAMUSCULAR | Status: AC
  Administered 2021-12-28: 2 [IU] via SUBCUTANEOUS
  Filled 2021-12-28: qty 1

## 2021-12-28 MED ORDER — PHENYLEPHRINE HCL-NACL 20-0.9 MG/250ML-% IV SOLN
INTRAVENOUS | Status: DC | PRN
  Administered 2021-12-28: 40 ug/min via INTRAVENOUS

## 2021-12-28 MED ORDER — MIDAZOLAM HCL 5 MG/5ML IJ SOLN
INTRAMUSCULAR | Status: DC | PRN
  Administered 2021-12-28: 2 mg via INTRAVENOUS

## 2021-12-28 MED ORDER — PHENYLEPHRINE 80 MCG/ML (10ML) SYRINGE FOR IV PUSH (FOR BLOOD PRESSURE SUPPORT)
PREFILLED_SYRINGE | INTRAVENOUS | Status: DC | PRN
  Administered 2021-12-28: 80 ug via INTRAVENOUS

## 2021-12-28 MED ORDER — LABETALOL HCL 5 MG/ML IV SOLN
5.0000 mg | INTRAVENOUS | Status: DC | PRN
  Administered 2021-12-28: 5 mg via INTRAVENOUS

## 2021-12-28 MED ORDER — PROPOFOL 10 MG/ML IV BOLUS
INTRAVENOUS | Status: AC
  Filled 2021-12-28: qty 20

## 2021-12-28 MED ORDER — MORPHINE SULFATE (PF) 4 MG/ML IV SOLN
INTRAVENOUS | Status: AC
  Filled 2021-12-28: qty 2

## 2021-12-28 MED ORDER — ORAL CARE MOUTH RINSE: 15.0000 mL | Freq: Once | OROMUCOSAL | Status: AC

## 2021-12-28 MED ORDER — MORPHINE SULFATE 4 MG/ML IJ SOLN
INTRAMUSCULAR | Status: DC | PRN
  Administered 2021-12-28: 8 mg

## 2021-12-28 MED ORDER — POVIDONE-IODINE 7.5 % EX SOLN
Freq: Once | CUTANEOUS | Status: DC
  Filled 2021-12-28: qty 118

## 2021-12-28 MED ORDER — FENTANYL CITRATE (PF) 100 MCG/2ML IJ SOLN: 25.0000 ug | INTRAMUSCULAR | Status: DC | PRN

## 2021-12-28 MED ORDER — BUPIVACAINE HCL (PF) 0.25 % IJ SOLN
INTRAMUSCULAR | Status: AC
  Filled 2021-12-28: qty 30

## 2021-12-28 MED ORDER — KETAMINE HCL 50 MG/5ML IJ SOSY
PREFILLED_SYRINGE | INTRAMUSCULAR | Status: AC
  Filled 2021-12-28: qty 5

## 2021-12-28 MED ORDER — BUPIVACAINE-EPINEPHRINE (PF) 0.25% -1:200000 IJ SOLN
INTRAMUSCULAR | Status: AC
  Filled 2021-12-28: qty 30

## 2021-12-28 MED ORDER — CEFAZOLIN SODIUM-DEXTROSE 2-4 GM/100ML-% IV SOLN
INTRAVENOUS | Status: AC
  Filled 2021-12-28: qty 100

## 2021-12-28 MED ORDER — CLONIDINE HCL (ANALGESIA) 100 MCG/ML EP SOLN
EPIDURAL | Status: AC
  Filled 2021-12-28: qty 10

## 2021-12-28 MED ORDER — MIDAZOLAM HCL 2 MG/2ML IJ SOLN
INTRAMUSCULAR | Status: AC
  Filled 2021-12-28: qty 2

## 2021-12-28 MED ORDER — CELECOXIB 100 MG PO CAPS: 100.0000 mg | ORAL_CAPSULE | Freq: Two times a day (BID) | ORAL | 0 refills | Status: DC

## 2021-12-28 MED ORDER — ASPIRIN 81 MG PO CHEW: 81.0000 mg | CHEWABLE_TABLET | Freq: Two times a day (BID) | ORAL | 0 refills | Status: AC

## 2021-12-28 SURGICAL SUPPLY — 50 items
BAG COUNTER SPONGE SURGICOUNT (BAG) ×1 IMPLANT
BANDAGE ESMARK 6X9 LF (GAUZE/BANDAGES/DRESSINGS) IMPLANT
BLADE CLIPPER SURG (BLADE) IMPLANT
BLADE EXCALIBUR 4.0X13 (MISCELLANEOUS) ×1 IMPLANT
BLADE SHAVER TORPEDO 4X13 (MISCELLANEOUS) IMPLANT
BNDG ELASTIC 6X10 VLCR STRL LF (GAUZE/BANDAGES/DRESSINGS) IMPLANT
BNDG ESMARK 6X9 LF (GAUZE/BANDAGES/DRESSINGS)
COOLER ICEMAN CLASSIC (MISCELLANEOUS) IMPLANT
COVER SURGICAL LIGHT HANDLE (MISCELLANEOUS) ×1 IMPLANT
CUFF TOURN SGL QUICK 34 (TOURNIQUET CUFF)
CUFF TOURN SGL QUICK 42 (TOURNIQUET CUFF) IMPLANT
CUFF TRNQT CYL 34X4.125X (TOURNIQUET CUFF) IMPLANT
DRAPE ARTHROSCOPY W/POUCH 114 (DRAPES) ×1 IMPLANT
DRAPE U-SHAPE 47X51 STRL (DRAPES) ×1 IMPLANT
DRSG TEGADERM 4X4.75 (GAUZE/BANDAGES/DRESSINGS) ×1 IMPLANT
DRSG XEROFORM 1X8 (GAUZE/BANDAGES/DRESSINGS) IMPLANT
DURAPREP 26ML APPLICATOR (WOUND CARE) ×1 IMPLANT
DW OUTFLOW CASSETTE/TUBE SET (MISCELLANEOUS) ×1 IMPLANT
GAUZE SPONGE 4X4 12PLY STRL (GAUZE/BANDAGES/DRESSINGS) IMPLANT
GAUZE SPONGE 4X4 12PLY STRL LF (GAUZE/BANDAGES/DRESSINGS) IMPLANT
GAUZE XEROFORM 1X8 LF (GAUZE/BANDAGES/DRESSINGS) IMPLANT
GLOVE BIOGEL PI IND STRL 8 (GLOVE) ×1 IMPLANT
GLOVE ECLIPSE 8.0 STRL XLNG CF (GLOVE) ×1 IMPLANT
GOWN STRL REUS W/ TWL LRG LVL3 (GOWN DISPOSABLE) ×2 IMPLANT
GOWN STRL REUS W/ TWL XL LVL3 (GOWN DISPOSABLE) ×1 IMPLANT
GOWN STRL REUS W/TWL LRG LVL3 (GOWN DISPOSABLE) ×2
GOWN STRL REUS W/TWL XL LVL3 (GOWN DISPOSABLE) ×1
KIT BASIN OR (CUSTOM PROCEDURE TRAY) ×1 IMPLANT
KIT TURNOVER KIT B (KITS) ×1 IMPLANT
MANIFOLD NEPTUNE II (INSTRUMENTS) IMPLANT
NDL 18GX1X1/2 (RX/OR ONLY) (NEEDLE) IMPLANT
NDL HYPO 25GX1X1/2 BEV (NEEDLE) ×1 IMPLANT
NEEDLE 18GX1X1/2 (RX/OR ONLY) (NEEDLE) ×2 IMPLANT
NEEDLE HYPO 25GX1X1/2 BEV (NEEDLE) ×1 IMPLANT
NS IRRIG 1000ML POUR BTL (IV SOLUTION) IMPLANT
PACK ARTHROSCOPY DSU (CUSTOM PROCEDURE TRAY) ×1 IMPLANT
PAD ARMBOARD 7.5X6 YLW CONV (MISCELLANEOUS) ×2 IMPLANT
PAD COLD SHLDR WRAP-ON (PAD) IMPLANT
PADDING CAST COTTON 6X4 STRL (CAST SUPPLIES) ×1 IMPLANT
PORT APPOLLO RF 90DEGREE MULTI (SURGICAL WAND) IMPLANT
SPONGE T-LAP 4X18 ~~LOC~~+RFID (SPONGE) ×1 IMPLANT
SUT ETHILON 3 0 PS 1 (SUTURE) ×1 IMPLANT
SYR 20ML ECCENTRIC (SYRINGE) ×1 IMPLANT
SYR CONTROL 10ML LL (SYRINGE) IMPLANT
SYR TB 1ML LUER SLIP (SYRINGE) ×1 IMPLANT
TOWEL GREEN STERILE (TOWEL DISPOSABLE) ×1 IMPLANT
TOWEL GREEN STERILE FF (TOWEL DISPOSABLE) ×1 IMPLANT
TUBE CONNECTING 12X1/4 (SUCTIONS) ×1 IMPLANT
TUBING ARTHROSCOPY IRRIG 16FT (MISCELLANEOUS) ×1 IMPLANT
WATER STERILE IRR 1000ML POUR (IV SOLUTION) ×1 IMPLANT

## 2021-12-28 NOTE — Anesthesia Postprocedure Evaluation (Signed)
Anesthesia Post Note  Patient: Jeffrey Austin  Procedure(s) Performed: RIGHT KNEE ARTHROSCOPY, DEBRIDEMENT (Right: Knee)     Patient location during evaluation: PACU Anesthesia Type: General Level of consciousness: awake Pain management: pain level controlled Respiratory status: spontaneous breathing Cardiovascular status: stable Postop Assessment: no apparent nausea or vomiting Anesthetic complications: no   No notable events documented.  Last Vitals:  Vitals:   12/28/21 1350  BP: (!) 177/109  Pulse: 96  Resp: 18  SpO2: 99%    Last Pain:  Vitals:   12/28/21 1350  PainSc: 7                  Lillis Nuttle

## 2021-12-28 NOTE — H&P (Signed)
Jeffrey Austin is an 61 y.o. male.   Chief Complaint: Right knee pain HPI: Jeffrey Austin is a 61 y.o. male who presents to the office reporting .  Right knee pain.  He has had pain since the end of August.  He fell and landed on his knee while visiting his family in [place].  Walks 3 miles a day to help his back.  He has had spinal fusion x3.  He did have an injection which helped him for about 3 to 4 days.  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  Going to [place] in the beginning of December.  No relief from ibuprofen or Tylenol.  MRI scan demonstrates complex tear of the posterior horn of the meniscus.  Also has a little bit of edema along the medial collateral ligament but the ligament itself is not torn.  There is some patellofemoral arthritis.   Past Medical History:  Diagnosis Date   Back pain    Diabetes mellitus without complication ([place])    Hyperlipidemia    Hypertension     Past Surgical History:  Procedure Laterality Date   cyst removed from testicle      INCISIONAL HERNIA REPAIR N/A 02/01/2016   Procedure: LAPAROSCOPIC INCISIONAL HERNIA REPAIR;  Surgeon: Arta Bruce Kinsinger, MD;  Location: [facility];  Service: General;  Laterality: N/A;   INSERTION OF MESH N/A 02/01/2016   Procedure: INSERTION OF MESH;  Surgeon: Arta Bruce Kinsinger, MD;  Location: [facility];  Service: General;  Laterality: N/A;   LUMBAR FUSION     SPINAL FUSION      Family History  Problem Relation Age of Onset   Leukemia Father    Colon cancer Neg Hx    Esophageal cancer Neg Hx    Rectal cancer Neg Hx    Stomach cancer Neg Hx    Social History:  reports that he has been smoking cigarettes. He has been smoking an average of .25 packs per day. He has never used smokeless tobacco. He reports current alcohol use of about 5.0 - 10.0 standard drinks of alcohol per week. He reports that he does not use drugs.  Allergies: No Known Allergies  Medications Prior to  Admission  Medication Sig Dispense Refill   amLODipine (NORVASC) 5 MG tablet TAKE 1 TABLET (5 MG TOTAL) BY MOUTH DAILY. 90 tablet 0   atorvastatin (LIPITOR) 40 MG tablet TAKE 1 TABLET EVERY DAY 90 tablet 3   glipiZIDE (GLUCOTROL) 5 MG tablet TAKE 1 TABLET TWICE DAILY BEFORE MEALS 180 tablet 2   losartan-hydrochlorothiazide (HYZAAR) 100-25 MG tablet TAKE 1 TABLET EVERY DAY 90 tablet 3   metFORMIN (GLUCOPHAGE) 1000 MG tablet Take 1 tablet (1,000 mg total) by mouth 2 (two) times daily with a meal. 180 tablet 0   meloxicam (MOBIC) 15 MG tablet Take 1 tablet (15 mg total) by mouth daily. (Patient not taking: Reported on 12/18/2021) 30 tablet 0    Results for orders placed or performed during the hospital encounter of 12/28/21 (from the past 48 hour(s))  Glucose, capillary     Status: Abnormal   Collection Time: 12/28/21  1:52 PM  Result Value Ref Range   Glucose-Capillary 170 (H) 70 - 99 mg/dL    Comment: Glucose reference range applies only to samples taken after fasting for at least 8 hours.   Comment 1 Notify RN    Comment 2 Document in Chart    No results found.  Review of Systems  Musculoskeletal:  Positive for arthralgias.  All other systems reviewed and are negative.   Blood pressure (!) 177/109, pulse 96, resp. rate 18, height '5\' 10"'$  (1.778 m), weight 101.8 kg, SpO2 99 %. Physical Exam Vitals reviewed.  HENT:     Head: Normocephalic.     Nose: Nose normal.     Mouth/Throat:     Mouth: Mucous membranes are moist.  Eyes:     Pupils: Pupils are equal, round, and reactive to light.  Cardiovascular:     Rate and Rhythm: Normal rate.     Pulses: Normal pulses.  Pulmonary:     Effort: Pulmonary effort is normal.  Abdominal:     General: Abdomen is flat.  Musculoskeletal:     Cervical back: Normal range of motion.  Skin:    General: Skin is warm.     Capillary Refill: Capillary refill takes less than 2 seconds.  Neurological:     General: No focal deficit present.      Mental Status: He is alert.  Psychiatric:        Mood and Affect: Mood normal.    Ortho exam demonstrates trace right knee effusion.  Medial joint line tenderness is present.  McMurray compression testing is positive.  Collateral crucial ligaments are stable.  Pedal pulses palpable.  No nerve root tension signs.   Assessment/Plan Impression is right knee medial meniscal tear.  Failed conservative treatment.  Does not look like it is radicular nature.  Patient does need to be able to walk in order to rehabilitate his back.  He was walking 3 miles a day but now really cannot walk much at all.  Plan is arthroscopy and debridement.  The risk and benefits of the procedure discussed with the patient including not limited to infection nerve vessel damage incomplete pain relief as well as potential need for further surgery if arthritis is found to be the potentially predominant pain generator.   Anderson Malta, MD 12/28/2021, 2:25 PM

## 2021-12-28 NOTE — TOC CM/SW Note (Signed)
Information sent to [place] [facility] to process for RW for home. PACU RN will pick up RW in office to give to patient. [place], Heart Failure TOC CM [phone]

## 2021-12-28 NOTE — Transfer of Care (Signed)
Immediate Anesthesia Transfer of Care Note  Patient: Jeffrey Austin  Procedure(s) Performed: RIGHT KNEE ARTHROSCOPY, DEBRIDEMENT (Right: Knee)  Patient Location: PACU  Anesthesia Type:General  Level of Consciousness: drowsy  Airway & Oxygen Therapy: Patient Spontanous Breathing  Post-op Assessment: Report given to RN and Post -op Vital signs reviewed and stable  Post vital signs: Reviewed and stable  Last Vitals:  Vitals Value Taken Time  BP 145/91 12/28/21 1715  Temp    Pulse 88 12/28/21 1716  Resp 11 12/28/21 1716  SpO2 94 % 12/28/21 1716  Vitals shown include unvalidated device data.  Last Pain:  Vitals:   12/28/21 1350  PainSc: 7       Patients Stated Pain Goal: 0 (79/98/72 1587)  Complications: No notable events documented.

## 2021-12-28 NOTE — Brief Op Note (Signed)
   12/28/2021  5:13 PM  PATIENT:  Jeffrey Austin  60 y.o. male  PRE-OPERATIVE DIAGNOSIS:  right knee medial meniscal tear  POST-OPERATIVE DIAGNOSIS:  right knee medial meniscal tear,   PROCEDURE:  Procedure(s): RIGHT KNEE ARTHROSCOPY, DEBRIDEMENT  SURGEON:  Surgeon(s): Marlou Sa, Tonna Corner, MD  ASSISTANT: Annie Main, PA  ANESTHESIA:   general  EBL: 2 cc ml    Total I/O In: 1000 [I.V.:1000] Out: -   BLOOD ADMINISTERED: none  DRAINS: none   LOCAL MEDICATIONS USED: [place] morphine clonidine  SPECIMEN:  No Specimen  COUNTS:  YES  TOURNIQUET:  * No tourniquets in log *  DICTATION: .Other Dictation: Dictation Number [id number]*  PLAN OF CARE: Discharge to home after PACU  PATIENT DISPOSITION:  PACU - hemodynamically stable

## 2021-12-28 NOTE — Anesthesia Preprocedure Evaluation (Signed)
Anesthesia Evaluation  Patient identified by MRN, date of birth, ID band Patient awake    Reviewed: Allergy & Precautions, H&P , NPO status , Patient's Chart, lab work & pertinent test results  Airway Mallampati: II   Neck ROM: full    Dental   Pulmonary Current Smoker,    breath sounds clear to auscultation       Cardiovascular hypertension,  Rhythm:regular Rate:Normal     Neuro/Psych    GI/Hepatic   Endo/Other  diabetes, Type 2  Renal/GU Renal InsufficiencyRenal disease     Musculoskeletal   Abdominal   Peds  Hematology   Anesthesia Other Findings   Reproductive/Obstetrics                             Anesthesia Physical Anesthesia Plan  ASA: 2  Anesthesia Plan: General   Post-op Pain Management:    Induction: Intravenous  PONV Risk Score and Plan: 1 and Ondansetron, Dexamethasone, Midazolam and Treatment may vary due to age or medical condition  Airway Management Planned: LMA  Additional Equipment:   Intra-op Plan:   Post-operative Plan: Extubation in OR  Informed Consent: I have reviewed the patients History and Physical, chart, labs and discussed the procedure including the risks, benefits and alternatives for the proposed anesthesia with the patient or authorized representative who has indicated his/her understanding and acceptance.     Dental advisory given  Plan Discussed with: CRNA, Anesthesiologist and Surgeon  Anesthesia Plan Comments:         Anesthesia Quick Evaluation

## 2021-12-28 NOTE — Anesthesia Procedure Notes (Signed)
Procedure Name: Intubation Date/Time: 12/28/2021 4:12 PM  Performed by: Georgia Duff, CRNAPre-anesthesia Checklist: Patient identified, Emergency Drugs available, Suction available and Patient being monitored Patient Re-evaluated:Patient Re-evaluated prior to induction Oxygen Delivery Method: Circle System Utilized Preoxygenation: Pre-oxygenation with 100% oxygen Induction Type: IV induction Ventilation: Mask ventilation without difficulty Laryngoscope Size: Miller and 3 Grade View: Grade I Tube type: Oral Tube size: 7.5 mm Number of attempts: 1 Airway Equipment and Method: Stylet and Oral airway Placement Confirmation: ETT inserted through vocal cords under direct vision, positive ETCO2 and breath sounds checked- equal and bilateral Secured at: 22 cm Tube secured with: Tape Dental Injury: Teeth and Oropharynx as per pre-operative assessment

## 2021-12-29 ENCOUNTER — Encounter (HOSPITAL_COMMUNITY): Payer: Self-pay | Admitting: Orthopedic Surgery

## 2021-12-29 ENCOUNTER — Other Ambulatory Visit: Payer: Self-pay

## 2021-12-29 LAB — GLUCOSE, CAPILLARY: Glucose-Capillary: 151 mg/dL — ABNORMAL HIGH (ref 70–99)

## 2021-12-29 NOTE — Op Note (Unsigned)
Jeffrey Austin, MARLETTE MEDICAL RECORD NO: [id number] ACCOUNT NO: [id number] DATE OF BIRTH: Jun 02, 1960 FACILITY: [facility] LOCATION: [facility] PHYSICIAN: Yetta Barre. Marlou Sa, MD  Operative Report   DATE OF PROCEDURE: 12/28/2021   PREOPERATIVE DIAGNOSES:  Right knee medial meniscal tear.  POSTOPERATIVE DIAGNOSES:   1.  Right knee medial meniscal tear. 2.  Lateral meniscal tear.  PROCEDURES:  Right knee arthroscopy with meniscal debridement medially and laterally with partial limited medial and lateral meniscectomy.  SURGEON:  Yetta Barre. Marlou Sa, MD  ASSISTANT:  Annie Main, PA.  INDICATIONS:  The patient is a 61 year old patient with right knee pain with medial meniscal tear on MRI scan.  Presents for operative management after explanation of risks and benefits.  DESCRIPTION OF PROCEDURE:  The patient was brought to the operating room where general endotracheal anesthesia was induced.  Preoperative antibiotics administered.  Timeout was called.  Right knee examined under anesthesia and found to have near full  extension and flexion to 120 with stability to varus and valgus stress at 0 to 30 degrees with intact ACL and PCL.  Next, the right leg was prescrubbed with alcohol and Betadine, allowed to air dry, prepped with DuraPrep solution and draped in sterile  manner.  Charlie Pitter was not utilized.  Timeout was called.  Anterior inferolateral portal was created.  Anterior inferomedial portal was created under direct visualization.  Both of these portals were anesthetized with Marcaine with epinephrine about 5 mL  each.  Diagnostic arthroscopy was performed.  The patient did have grade II-III chondromalacia about size of a dime in the landing zone of the trochlea.  No loose bodies medial or lateral gutter.  Lateral compartment had intact articular cartilage on the  femur and tibia, but did have a meniscal tear, particularly on the undersurface of the posterior horn lateral meniscus as well as fraying towards  the central portion of the meniscus.  These areas of tearing and degeneration were debrided back to a  stable rim using a combination of basket punch and shaver.  A torpedo shaver was utilized.  ACL and PCL intact.  Medial compartment was inspected.  The patient did have a medial meniscal tear, which corresponded to the MRI scanning.  Involved about 50%  of the meniscal volume.  At this time, the flap underneath the tibia was removed and resected.  Using a basket punch and shaver, the remaining meniscal degeneration and tearing was debrided.  There was also an area of degeneration grade 2 chondromalacia  over about 25% of the weightbearing surface area of the medial femoral condyle.  The areas were also debrided of loose chondral flaps.  No exposed subchondral bone was present.  Next, thorough irrigation was performed.  Instruments were removed.  Portals  were closed using 3-0 nylon.  Solution of Marcaine, morphine, clonidine injected into the knee for postoperative pain relief.  The patient tolerated the procedure well without immediate complication.  Luke's assistance was required at all times for  opening, closing, mobilization of tissue.  His assistance was a medical necessity.     SUJ D: 12/28/2021 5:17:21 pm T: 12/29/2021 1:54:00 am  JOB: [id number]/ [id number]

## 2021-12-31 DIAGNOSIS — S83281A Other tear of lateral meniscus, current injury, right knee, initial encounter: Secondary | ICD-10-CM

## 2021-12-31 DIAGNOSIS — S83241A Other tear of medial meniscus, current injury, right knee, initial encounter: Secondary | ICD-10-CM

## 2022-01-05 ENCOUNTER — Ambulatory Visit (INDEPENDENT_AMBULATORY_CARE_PROVIDER_SITE_OTHER): Payer: Medicare HMO | Admitting: Orthopedic Surgery

## 2022-01-05 ENCOUNTER — Encounter: Payer: Self-pay | Admitting: Orthopedic Surgery

## 2022-01-05 DIAGNOSIS — S83241D Other tear of medial meniscus, current injury, right knee, subsequent encounter: Secondary | ICD-10-CM

## 2022-01-05 NOTE — Progress Notes (Signed)
Post-Op Visit Note   Patient: Jeffrey Austin           Date of Birth: Feb 01, 1961           MRN: [id number] Visit Date: 01/05/2022 PCP: Shelda Pal, DO   Assessment & Plan:  Chief Complaint:  Chief Complaint  Patient presents with   Right Knee - Routine Post Op   Visit Diagnoses:  1. Acute medial meniscus tear of right knee, subsequent encounter     Plan: Jeffrey Austin is a 61 year old patient is now a week out right knee arthroscopy in debridement of meniscal tear.  He is doing well.  On exam he has mild effusion with excellent range of motion no calf tenderness negative Homans.  Gait is normal.  Plan at this time is nonweightbearing questioning doing exercises for the next 4 weeks.  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  Come back in 4 weeks for final check and release.  Would prefer that he not walk for exercise at this time until his quad strength is up.  Follow-Up Instructions: No follow-ups on file.   Orders:  No orders of the defined types were placed in this encounter.  No orders of the defined types were placed in this encounter.   Imaging: No results found.  PMFS History: Patient Active Problem List   Diagnosis Date Noted   Acute medial meniscus tear of right knee    Acute lateral meniscus tear of right knee    Pain in right knee 11/29/2021   Chronic low back pain with bilateral sciatica 04/22/2017   Postlaminectomy syndrome, lumbar region 04/22/2017   Essential hypertension 03/10/2017   Tobacco abuse 03/10/2017   Type 2 diabetes mellitus with hyperglycemia, without long-term current use of insulin ([place]) 03/10/2017   Past Medical History:  Diagnosis Date   Back pain    Diabetes mellitus without complication ([place])    Hyperlipidemia    Hypertension     Family History  Problem Relation Age of Onset   Leukemia Father    Colon cancer Neg Hx    Esophageal cancer Neg Hx    Rectal cancer Neg Hx    Stomach  cancer Neg Hx     Past Surgical History:  Procedure Laterality Date   cyst removed from testicle      INCISIONAL HERNIA REPAIR N/A 02/01/2016   Procedure: LAPAROSCOPIC INCISIONAL HERNIA REPAIR;  Surgeon: Arta Bruce Kinsinger, MD;  Location: [facility];  Service: General;  Laterality: N/A;   INSERTION OF MESH N/A 02/01/2016   Procedure: INSERTION OF MESH;  Surgeon: Arta Bruce Kinsinger, MD;  Location: [facility];  Service: General;  Laterality: N/A;   KNEE ARTHROSCOPY Right 12/28/2021   Procedure: RIGHT KNEE ARTHROSCOPY, DEBRIDEMENT;  Surgeon: Meredith Pel, MD;  Location: [place];  Service: Orthopedics;  Laterality: Right;   LUMBAR FUSION     SPINAL FUSION     Social History   Occupational History   Not on file  Tobacco Use   Smoking status: Some Days    Packs/day: 0.25    Types: Cigarettes   Smokeless tobacco: Never  Vaping Use   Vaping Use: Never used  Substance and Sexual Activity   Alcohol use: Yes    Alcohol/week: 5.0 - 10.0 standard drinks of alcohol    Types: 5 - 10 Standard drinks or equivalent per week   Drug use: No   Sexual activity: Yes

## 2022-01-05 NOTE — Addendum Note (Signed)
Addended by: Robyne Peers on: 01/05/2022 10:44 AM   Modules accepted: Orders

## 2022-01-10 ENCOUNTER — Other Ambulatory Visit: Payer: Self-pay

## 2022-01-10 ENCOUNTER — Ambulatory Visit: Payer: Medicare HMO | Attending: Orthopedic Surgery | Admitting: Physical Therapy

## 2022-01-10 ENCOUNTER — Encounter: Payer: Self-pay | Admitting: Physical Therapy

## 2022-01-10 DIAGNOSIS — S83241D Other tear of medial meniscus, current injury, right knee, subsequent encounter: Secondary | ICD-10-CM | POA: Diagnosis not present

## 2022-01-10 DIAGNOSIS — M6281 Muscle weakness (generalized): Secondary | ICD-10-CM | POA: Diagnosis not present

## 2022-01-10 NOTE — Therapy (Signed)
OUTPATIENT PHYSICAL THERAPY LOWER EXTREMITY EVALUATION   Patient Name: Jeffrey Austin MRN: [id number] DOB:04-11-1960, 61 y.o., male Today's Date: 01/10/2022   PT End of Session - 01/10/22 1349     Visit Number 1    Number of Visits 1    Date for PT Re-Evaluation 01/10/22    PT Start Time 1325    PT Stop Time 5284    PT Time Calculation (min) 24 min    Activity Tolerance Patient tolerated treatment well    Behavior During Therapy [facility] for tasks assessed/performed             Past Medical History:  Diagnosis Date   Back pain    Diabetes mellitus without complication ([employer])    Hyperlipidemia    Hypertension    Past Surgical History:  Procedure Laterality Date   cyst removed from testicle      [place] N/A 02/01/2016   Procedure: [place];  Surgeon: Mickeal Skinner, MD;  Location: [facility];  Service: General;  Laterality: N/A;   INSERTION OF MESH N/A 02/01/2016   Procedure: INSERTION OF MESH;  Surgeon: Arta Bruce Kinsinger, MD;  Location: [facility];  Service: General;  Laterality: N/A;   KNEE ARTHROSCOPY Right 12/28/2021   Procedure: RIGHT KNEE ARTHROSCOPY, DEBRIDEMENT;  Surgeon: Meredith Pel, MD;  Location: [place];  Service: Orthopedics;  Laterality: Right;   LUMBAR FUSION     SPINAL FUSION     Patient Active Problem List   Diagnosis Date Noted   Acute medial meniscus tear of right knee    Acute lateral meniscus tear of right knee    Pain in right knee 11/29/2021   Chronic low back pain with bilateral sciatica 04/22/2017   Postlaminectomy syndrome, lumbar region 04/22/2017   Essential hypertension 03/10/2017   Tobacco abuse 03/10/2017   Type 2 diabetes mellitus with hyperglycemia, without long-term current use of insulin ([place]) 03/10/2017    PCP: Riki Sheer  REFERRING PROVIDER: Marcene Duos  REFERRING DIAG: acute meniscus tear of Rt knee  THERAPY DIAG:  Muscle weakness (generalized)  Rationale for  Evaluation and Treatment: Rehabilitation  ONSET DATE: 12/28/21  SUBJECTIVE:   SUBJECTIVE STATEMENT: Pt tore meniscus this summer during a fall. Pt had meniscus repair surgery 1.5 weeks ago. Pt with no c/o pain and no difficulty with ADLs or IADLs. Pt wants to return to walking for exercise 3-4 miles a day.  PERTINENT HISTORY: Arthritis Rt knee PAIN:  Are you having pain? No  PRECAUTIONS: quad strengthening, non weight bearing exercises per MD  WEIGHT BEARING RESTRICTIONS: No  FALLS:  Has patient fallen in last 6 months? Yes. Number of falls 1  OCCUPATION: retired  PLOF: Independent  PATIENT GOALS: return to walking 3-4 miles a day  NEXT MD VISIT: 02/07/22  OBJECTIVE:    COGNITION: Overall cognitive status: Within functional limits for tasks assessed     SENSATION: WFL   PALPATION: No TTP  LOWER EXTREMITY ROM:  Active ROM Right eval Left eval  Hip flexion    Hip extension    Hip abduction    Hip adduction    Hip internal rotation    Hip external rotation    Knee flexion 117   Knee extension 0   Ankle dorsiflexion    Ankle plantarflexion    Ankle inversion    Ankle eversion     (Blank rows = not tested)  LOWER EXTREMITY MMT:  MMT Right eval Left eval  Hip flexion  4   Hip extension 4   Hip abduction    Hip adduction    Hip internal rotation    Hip external rotation    Knee flexion 4 4+  Knee extension 4 4+  Ankle dorsiflexion    Ankle plantarflexion    Ankle inversion    Ankle eversion     (Blank rows = not tested)   FUNCTIONAL TESTS:  SLS Rt 3 sec, Lt 6 sec    TODAY'S TREATMENT:                                                                                                                              DATE: eval only    PATIENT EDUCATION:  Education details: PT POC, HEP Person educated: Patient Education method: Explanation, Demonstration, and Handouts Education comprehension: verbalized understanding and returned  demonstration  HOME EXERCISE PROGRAM: Access Code: [email or web address] URL: [email or web address] Date: 01/10/2022 Prepared by: Isabelle Course  Exercises - Supine Active Straight Leg Raise  - 1 x daily - 7 x weekly - 3 sets - 10 reps - Straight Leg Raise with External Rotation  - 1 x daily - 7 x weekly - 3 sets - 10 reps - Hip Abduction with Resistance Loop  - 1 x daily - 7 x weekly - 3 sets - 10 reps - Hip Extension with Resistance Loop  - 1 x daily - 7 x weekly - 3 sets - 10 reps - Single Leg Stance with Support  - 1 x daily - 7 x weekly - 1 sets - 10 reps - 10-20 seconds hold  ASSESSMENT:  CLINICAL IMPRESSION: Patient is a 61 y.o. male who was seen today for physical therapy evaluation and treatment for meniscus tear of Rt knee. Pt presents with decreased strength and balance and was issued HEP to address deficits. Pt with no further PT needs at this time.   OBJECTIVE IMPAIRMENTS: decreased balance and difficulty walking.   ACTIVITY LIMITATIONS: locomotion level  PARTICIPATION LIMITATIONS: community activity  PERSONAL FACTORS: Past/current experiences are also affecting patient's functional outcome.   REHAB POTENTIAL: Good  CLINICAL DECISION MAKING: Stable/uncomplicated  EVALUATION COMPLEXITY: Low   GOALS: Eval only. No goals      PLAN:  PT FREQUENCY: one time visit  PT DURATION: 1 visit  PLANNED INTERVENTIONS: evaluation only  PLAN FOR NEXT SESSION: eval only   Amilliana Hayworth, PT 01/10/2022, 1:56 PM

## 2022-01-31 ENCOUNTER — Other Ambulatory Visit: Payer: Self-pay | Admitting: Family Medicine

## 2022-01-31 DIAGNOSIS — E1165 Type 2 diabetes mellitus with hyperglycemia: Secondary | ICD-10-CM

## 2022-02-07 ENCOUNTER — Encounter: Payer: Medicare HMO | Admitting: Orthopedic Surgery

## 2022-03-01 ENCOUNTER — Other Ambulatory Visit: Payer: Self-pay | Admitting: Family Medicine

## 2022-03-01 DIAGNOSIS — E119 Type 2 diabetes mellitus without complications: Secondary | ICD-10-CM

## 2022-03-08 ENCOUNTER — Other Ambulatory Visit: Payer: Self-pay | Admitting: Family Medicine

## 2022-03-08 DIAGNOSIS — I10 Essential (primary) hypertension: Secondary | ICD-10-CM

## 2022-04-05 ENCOUNTER — Telehealth: Payer: Self-pay | Admitting: Family Medicine

## 2022-04-05 NOTE — Telephone Encounter (Signed)
Copied from [place] [id number]. Topic: Medicare AWV >> Apr 05, 2022  1:58 PM Devoria Glassing wrote: Reason for CRM: Left message for patient to schedule Annual Wellness Visit(AWV).  Please schedule with Health Nurse Advisor at [facility]. Please call [phone] ask for [facility].

## 2022-06-12 ENCOUNTER — Ambulatory Visit (INDEPENDENT_AMBULATORY_CARE_PROVIDER_SITE_OTHER): Payer: Medicare HMO | Admitting: Family Medicine

## 2022-06-12 VITALS — BP 134/72 | HR 103 | Temp 99.0°F | Ht 70.0 in | Wt 226.0 lb

## 2022-06-12 DIAGNOSIS — S90221A Contusion of right lesser toe(s) with damage to nail, initial encounter: Secondary | ICD-10-CM | POA: Diagnosis not present

## 2022-06-12 DIAGNOSIS — E1165 Type 2 diabetes mellitus with hyperglycemia: Secondary | ICD-10-CM | POA: Diagnosis not present

## 2022-06-12 DIAGNOSIS — I10 Essential (primary) hypertension: Secondary | ICD-10-CM

## 2022-06-12 NOTE — Progress Notes (Signed)
Chief Complaint  Patient presents with   2 toenails are discolored   Allergies    Jeffrey Austin is a 62 y.o. male here for a skin complaint.  Duration: 2 days Location: toes Pruritic? No Painful? No Drainage? No New soaps/lotions/topicals/detergents? No Sick contacts? No Other associated symptoms: got new shoes, bruising under nails (2-4 on R foot) Therapies tried thus far: none  DM- well controlled. On Glipizide 5 mg bid, Metformin 1000 mg bid. Does not monitor sugars. Due for eye exam soon. No lows. Not on insulin. No AE's w meds. Compliant.   HTN- does not monitor sugars. Diet is OK, doesn't exercise routinely. Taking Hyzaar 100-25 mg/d, Norvasc 5 mg/d. No AE's, compliant. No CP or sob.   Past Medical History:  Diagnosis Date   Back pain    Diabetes mellitus without complication    Hyperlipidemia    Hypertension     BP 134/72 (BP Location: Left Arm, Cuff Size: Normal)   Pulse (!) 103   Temp 99 F (37.2 C) (Oral)   Ht 5\' 10"  (1.778 m)   Wt 226 lb (102.5 kg)   SpO2 99%   BMI 32.43 kg/m  Gen: awake, alert, appearing stated age Heart: RRR Lungs: CTAB. No accessory muscle use Skin: 2-4 digits on R toes with hematoma under nails. No drainage, erythema, TTP, fluctuance, excoriation Psych: Age appropriate judgment and insight  Subungual hematoma of toe of right foot, initial encounter  Type 2 diabetes mellitus with hyperglycemia, without long-term current use of insulin - Plan: Comprehensive metabolic panel, Lipid panel, Hemoglobin A1c, Microalbumin / creatinine urine ratio  Essential hypertension  Reassurance Chronic, stable. Cont metformin 1000 mg bid, glipizide 5 mg bid. Sched f/u eye exam.  Chronic, stable. Cont Hyzaar 100-25 mg/d, Norvasc 5 mg/d.  F/u prn. The patient voiced understanding and agreement to the plan.  Jilda Roche Goodwin, DO 06/12/22 3:47 PM

## 2022-06-12 NOTE — Patient Instructions (Signed)
Claritin (loratadine), Allegra (fexofenadine), Zyrtec (cetirizine) which is also equivalent to Xyzal (levocetirizine); these are listed in order from weakest to strongest. Generic, and therefore cheaper, options are in the parentheses.  ? ?Flonase (fluticasone); nasal spray that is over the counter. 2 sprays each nostril, once daily. Aim towards the same side eye when you spray. ? ?There are available OTC, and the generic versions, which may be cheaper, are in parentheses. Show this to a pharmacist if you have trouble finding any of these items. ? ?Let us know if you need anything. ?

## 2022-06-13 ENCOUNTER — Other Ambulatory Visit: Payer: Self-pay | Admitting: Family Medicine

## 2022-06-13 DIAGNOSIS — E1165 Type 2 diabetes mellitus with hyperglycemia: Secondary | ICD-10-CM

## 2022-06-13 LAB — COMPREHENSIVE METABOLIC PANEL
ALT: 40 U/L (ref 0–53)
AST: 46 U/L — ABNORMAL HIGH (ref 0–37)
Albumin: 4.3 g/dL (ref 3.5–5.2)
Alkaline Phosphatase: 66 U/L (ref 39–117)
BUN: 15 mg/dL (ref 6–23)
CO2: 30 mEq/L (ref 19–32)
Calcium: 9.7 mg/dL (ref 8.4–10.5)
Chloride: 87 mEq/L — ABNORMAL LOW (ref 96–112)
Creatinine, Ser: 1.28 mg/dL (ref 0.40–1.50)
GFR: 60.73 mL/min (ref >60.00)
Glucose, Bld: 123 mg/dL — ABNORMAL HIGH (ref 70–99)
Potassium: 4.3 mEq/L (ref 3.5–5.1)
Sodium: 130 mEq/L — ABNORMAL LOW (ref 135–145)
Total Bilirubin: 0.9 mg/dL (ref 0.2–1.2)
Total Protein: 7.3 g/dL (ref 6.0–8.3)

## 2022-06-13 LAB — MICROALBUMIN / CREATININE URINE RATIO
Creatinine,U: 54.6 mg/dL
Microalb Creat Ratio: 158.9 mg/g — ABNORMAL HIGH (ref 0.0–30.0)
Microalb, Ur: 86.8 mg/dL — ABNORMAL HIGH (ref 0.0–1.9)

## 2022-06-13 LAB — LIPID PANEL
Cholesterol: 167 mg/dL (ref 0–200)
HDL: 88.8 mg/dL (ref >39.00)
LDL Cholesterol: 62 mg/dL (ref 0–99)
NonHDL: 78.16
Total CHOL/HDL Ratio: 2
Triglycerides: 79 mg/dL (ref 0.0–149.0)
VLDL: 15.8 mg/dL (ref 0.0–40.0)

## 2022-06-13 LAB — HEMOGLOBIN A1C: Hgb A1c MFr Bld: 5.6 % (ref 4.6–6.5)

## 2022-06-22 ENCOUNTER — Ambulatory Visit (INDEPENDENT_AMBULATORY_CARE_PROVIDER_SITE_OTHER): Payer: Medicare HMO | Admitting: Family Medicine

## 2022-06-22 ENCOUNTER — Other Ambulatory Visit (HOSPITAL_BASED_OUTPATIENT_CLINIC_OR_DEPARTMENT_OTHER): Payer: Self-pay

## 2022-06-22 ENCOUNTER — Telehealth: Payer: Self-pay | Admitting: Family Medicine

## 2022-06-22 ENCOUNTER — Encounter: Payer: Self-pay | Admitting: Family Medicine

## 2022-06-22 VITALS — BP 136/84 | HR 84 | Temp 97.9°F | Ht 70.0 in | Wt 221.0 lb

## 2022-06-22 DIAGNOSIS — R809 Proteinuria, unspecified: Secondary | ICD-10-CM | POA: Diagnosis not present

## 2022-06-22 DIAGNOSIS — E1165 Type 2 diabetes mellitus with hyperglycemia: Secondary | ICD-10-CM

## 2022-06-22 DIAGNOSIS — Z7984 Long term (current) use of oral hypoglycemic drugs: Secondary | ICD-10-CM

## 2022-06-22 LAB — COMPREHENSIVE METABOLIC PANEL
ALT: 36 U/L (ref 0–53)
AST: 32 U/L (ref 0–37)
Albumin: 4.4 g/dL (ref 3.5–5.2)
Alkaline Phosphatase: 69 U/L (ref 39–117)
BUN: 17 mg/dL (ref 6–23)
CO2: 30 mEq/L (ref 19–32)
Calcium: 10 mg/dL (ref 8.4–10.5)
Chloride: 88 mEq/L — ABNORMAL LOW (ref 96–112)
Creatinine, Ser: 1.44 mg/dL (ref 0.40–1.50)
GFR: 52.72 mL/min — ABNORMAL LOW (ref >60.00)
Glucose, Bld: 181 mg/dL — ABNORMAL HIGH (ref 70–99)
Potassium: 4 mEq/L (ref 3.5–5.1)
Sodium: 130 mEq/L — ABNORMAL LOW (ref 135–145)
Total Bilirubin: 0.9 mg/dL (ref 0.2–1.2)
Total Protein: 7.4 g/dL (ref 6.0–8.3)

## 2022-06-22 MED ORDER — EMPAGLIFLOZIN 25 MG PO TABS
25.0000 mg | ORAL_TABLET | Freq: Every day | ORAL | 3 refills | Status: DC
  Filled 2022-06-22: qty 30, 30d supply, fill #0

## 2022-06-22 NOTE — Telephone Encounter (Signed)
Called left message to call back 

## 2022-06-22 NOTE — Telephone Encounter (Signed)
New medication is too expensive ($45 per month) cannot do that please change.

## 2022-06-22 NOTE — Patient Instructions (Signed)
Give us 2-3 business days to get the results of your labs back.  Let us know if you need anything.  

## 2022-06-22 NOTE — Telephone Encounter (Signed)
Pt has question about medication he was prescribed earlier today and would like a call back

## 2022-06-22 NOTE — Progress Notes (Signed)
Chief Complaint  Patient presents with   Results    Subjective: Patient is a 62 y.o. male here for lab follow-up.  History of type 2 diabetes on metformin and glipizide.  He was found to have proteinuria on his last 2 checks.  He is on an angiotensin receptor blocker.  No frothy urine or blood in his urine.  Diet is improving.  Past Medical History:  Diagnosis Date   Back pain    Diabetes mellitus without complication    Hyperlipidemia    Hypertension     Objective: BP 136/84 (BP Location: Left Arm, Cuff Size: Normal)   Pulse 84   Temp 97.9 F (36.6 C) (Oral)   Ht  (1.778 m)   Wt 221 lb (100.2 kg)   SpO2 99%   BMI 31.71 kg/m  General: Awake, appears stated age Heart: RRR, no LE edema Lungs: CTAB, no rales, wheezes or rhonchi. No accessory muscle use Psych: Age appropriate judgment and insight, normal affect and mood  Assessment and Plan: Type 2 diabetes mellitus with hyperglycemia, without long-term current use of insulin - Plan: empagliflozin (JARDIANCE) 25 MG TABS tablet, Comprehensive metabolic panel  Proteinuria, unspecified type - Plan: empagliflozin (JARDIANCE) 25 MG TABS tablet  Stop Glipizide.  Chronic, uncontrolled. He is on an ARB. Cont. Add Jardiance 25 mg/d. Need to follow up on LFT's and Cr.  The patient voiced understanding and agreement to the plan.  Jilda Roche [place], DO 06/22/22  1:51 PM

## 2022-06-25 NOTE — Telephone Encounter (Signed)
This is the link he can use to apply for patient assistance.(PrankAwards.es )

## 2022-06-25 NOTE — Telephone Encounter (Signed)
We have #4 boxes of Farxiga samples

## 2022-06-25 NOTE — Addendum Note (Signed)
Addended by: Scharlene Gloss B on: 06/25/2022 04:29 PM   Modules accepted: Orders

## 2022-06-25 NOTE — Telephone Encounter (Signed)
Called left message to call back  PCP to change to Farxiga 10 mg 1 po qd.-- patient given patient assistance website to complete form. Updated medication list Samples given #4 boxes with #7 pills per box. [facility] #1610-9604-54 Lot# [id number] Exp date 08/02/2024

## 2022-06-26 NOTE — Telephone Encounter (Signed)
Called informed the patient of all information. He verbalize understanding.

## 2022-06-26 NOTE — Telephone Encounter (Signed)
Called left message to call back 

## 2022-07-10 ENCOUNTER — Telehealth: Payer: Self-pay | Admitting: Family Medicine

## 2022-07-10 ENCOUNTER — Other Ambulatory Visit (HOSPITAL_BASED_OUTPATIENT_CLINIC_OR_DEPARTMENT_OTHER): Payer: Self-pay

## 2022-07-10 MED ORDER — DAPAGLIFLOZIN PROPANEDIOL 10 MG PO TABS
10.0000 mg | ORAL_TABLET | Freq: Every day | ORAL | 3 refills | Status: DC
  Filled 2022-07-10 – 2022-07-12 (×2): qty 30, 30d supply, fill #0

## 2022-07-10 NOTE — Telephone Encounter (Signed)
Patient would like RX sent to pharmacy downstairs.

## 2022-07-10 NOTE — Telephone Encounter (Signed)
Called the patient back to confirm what pharmacy he would like this to be sent to.

## 2022-07-10 NOTE — Telephone Encounter (Signed)
Patient called and would like the medication Farxiga 10mg  sent to pharmacy again because at time did not have coupon available.

## 2022-07-10 NOTE — Telephone Encounter (Signed)
Sent to [facility] [employer] [facility]

## 2022-07-12 ENCOUNTER — Other Ambulatory Visit (HOSPITAL_BASED_OUTPATIENT_CLINIC_OR_DEPARTMENT_OTHER): Payer: Self-pay

## 2022-07-12 ENCOUNTER — Other Ambulatory Visit: Payer: Medicare HMO | Admitting: Pharmacist

## 2022-07-12 DIAGNOSIS — E1165 Type 2 diabetes mellitus with hyperglycemia: Secondary | ICD-10-CM

## 2022-07-12 NOTE — Progress Notes (Signed)
07/12/2022 Name: Jeffrey Austin MRN: [id number] DOB: 28-Feb-1961  Chief Complaint  Patient presents with   Medication Management    Medication cost    Jeffrey Austin is a 62 y.o. year old male who presented for a telephone visit.   They were referred to the pharmacist by their PCP for assistance in managing medication access.    Medication Access/Adherence  Current Pharmacy:  [facility] - [place], [place] - [address] [place] [address] Phone: [phone] Fax: [phone]  [facility] (Now [facility]) - [address] [place], [place] - [address] [place] [address] Phone: [phone] Fax: [phone]  [facility] [address] [place] [address] Phone: [phone] Fax: [phone]   Patient reports affordability concerns with their medications: Yes  Patient reports access/transportation concerns to their pharmacy: No  Patient reports adherence concerns with their medications:  No     Patient reports cost of Marcelline Deist is $95 per month.   Objective:  Lab Results  Component Value Date   HGBA1C 5.6 06/12/2022    Lab Results  Component Value Date   CREATININE 1.44 06/22/2022   BUN 17 06/22/2022   NA 130 (L) 06/22/2022   K 4.0 06/22/2022   CL 88 (L) 06/22/2022   CO2 30 06/22/2022    Lab Results  Component Value Date   CHOL 167 06/12/2022   HDL 88.80 06/12/2022   LDLCALC 62 06/12/2022   TRIG 79.0 06/12/2022   CHOLHDL 2 06/12/2022    Medications Reviewed Today     Reviewed by Henrene Pastor, RPH-CPP (Pharmacist) on 07/12/22 at 1502  Med List Status: <None>   Medication Order Taking? Sig Documenting Provider Last Dose Status Informant  amLODipine (NORVASC) 5 MG tablet [id number] Yes TAKE 1 TABLET EVERY DAY Sharlene Dory, DO Taking Active   atorvastatin (LIPITOR) 40 MG tablet  [id number] Yes TAKE 1 TABLET EVERY DAY Wendling, Jilda Roche, DO Taking Active Self  dapagliflozin propanediol (FARXIGA) 10 MG TABS tablet [id number] No Take 1 tablet (10 mg total) by mouth daily.  Patient not taking: Reported on 07/12/2022   Sharlene Dory, DO Not Taking Active   losartan-hydrochlorothiazide [facility]) 100-25 MG tablet [id number] Yes TAKE 1 TABLET EVERY DAY Sharlene Dory, DO Taking Active Self  metFORMIN (GLUCOPHAGE) 1000 MG tablet [id number] Yes TAKE 1 TABLET TWICE DAILY WITH MEALS Wendling, Jilda Roche, DO Taking Active               Assessment/Plan:  Tried to enroll patient in [facility] and Me patient assistance for Farxiga on line but states that patient already had a profile. Called [place] and [place] and patient was denied because they show that he has Comptroller.  I verified with Rosann Auerbach that he does not have coverage (only has his wife coverage). [place] and [place] is requiring letter that coverage was terminate / doesn't have coverage with commercial plan). I have requested letter from [facility] and they will fax over.   I was able to provide 30 days free coupon for [place]- called and gave needed info to [employer].   If patient is unable to enroll in [place] and [place] program he can switch to Jardiance 25mg  which cost is $125 / 90 days (at least until he reaches Medicare coverage gap) .   Henrene Pastor, PharmD Clinical Pharmacist [facility]

## 2022-07-12 NOTE — Telephone Encounter (Signed)
I can screen for medication assistance program for Farxiga.

## 2022-07-12 NOTE — Telephone Encounter (Signed)
Patient states Jeffrey Austin is too expensive and would like some advise on what to do. Please advise.

## 2022-07-12 NOTE — Telephone Encounter (Signed)
Pt does have proteinuria. Is on ARB, trying to get on SGLT-2 inhibitor but cost a barrier? Tried [place]. Any advice? Thanks.

## 2022-08-07 ENCOUNTER — Other Ambulatory Visit: Payer: Self-pay | Admitting: Family Medicine

## 2022-08-13 ENCOUNTER — Telehealth: Payer: Self-pay | Admitting: Family Medicine

## 2022-08-13 ENCOUNTER — Other Ambulatory Visit (HOSPITAL_BASED_OUTPATIENT_CLINIC_OR_DEPARTMENT_OTHER): Payer: Self-pay

## 2022-08-13 MED ORDER — EMPAGLIFLOZIN 25 MG PO TABS: 25.0000 mg | ORAL_TABLET | Freq: Every day | ORAL | 1 refills | Status: DC

## 2022-08-13 MED ORDER — EMPAGLIFLOZIN 25 MG PO TABS
25.0000 mg | ORAL_TABLET | Freq: Every day | ORAL | 3 refills | Status: DC
  Filled 2022-08-13: qty 30, 30d supply, fill #0

## 2022-08-13 NOTE — Addendum Note (Signed)
Addended by: Radene Gunning on: 08/13/2022 12:06 PM   Modules accepted: Orders

## 2022-08-13 NOTE — Telephone Encounter (Signed)
Patient called and would like RX mailed thru [facility] rather than at pharmacy downstairs. Patient states it is cheaper.

## 2022-08-13 NOTE — Telephone Encounter (Signed)
Sent in refill and patient informed 

## 2022-08-13 NOTE — Telephone Encounter (Signed)
Sent!

## 2022-08-13 NOTE — Telephone Encounter (Signed)
Pt called to check on status for change in medication from [place] to [place] as he is almost out of the [place] at this time.

## 2022-08-13 NOTE — Addendum Note (Signed)
Addended by: Scharlene Gloss B on: 08/13/2022 04:07 PM   Modules accepted: Orders

## 2022-08-14 ENCOUNTER — Encounter: Payer: Self-pay | Admitting: Physical Medicine & Rehabilitation

## 2022-08-14 ENCOUNTER — Other Ambulatory Visit (HOSPITAL_BASED_OUTPATIENT_CLINIC_OR_DEPARTMENT_OTHER): Payer: Self-pay

## 2022-08-14 ENCOUNTER — Encounter: Payer: Medicare HMO | Attending: Physical Medicine & Rehabilitation | Admitting: Physical Medicine & Rehabilitation

## 2022-08-14 VITALS — BP 135/90 | HR 113 | Ht 70.0 in | Wt 219.8 lb

## 2022-08-14 DIAGNOSIS — M545 Low back pain, unspecified: Secondary | ICD-10-CM | POA: Diagnosis not present

## 2022-08-14 DIAGNOSIS — M259 Joint disorder, unspecified: Secondary | ICD-10-CM | POA: Insufficient documentation

## 2022-08-14 DIAGNOSIS — Z981 Arthrodesis status: Secondary | ICD-10-CM | POA: Insufficient documentation

## 2022-08-14 DIAGNOSIS — M533 Sacrococcygeal disorders, not elsewhere classified: Secondary | ICD-10-CM | POA: Insufficient documentation

## 2022-08-14 NOTE — Patient Instructions (Signed)
Right sacroiliac injection

## 2022-08-14 NOTE — Progress Notes (Signed)
Subjective:    Patient ID: Jeffrey Austin, male    DOB: Jan 04, 1961, 62 y.o.   MRN: [id number]  HPI 62 year old male with chronic low back pain mainly right-sided.  He has undergone L4-5 and L5-S1 fusion anterior approach in [place] 2016.  He has been on prescription medication for pain.  He has been doing a regular exercise program including walking 1-1/2 hours/day.  Comorbidities include type 2 diabetes.  He has not been seen in this clinic for me nearly 3 years.  His last visit was in July 2021 for a right sacroiliac injection which was helpful in alleviating his pain.  His current pain is right side low back and upper buttock region.  No radiating pain into the lower extremities no leg weakness.  He is still walking on a regular basis. He does not recall any recent injury or falls. Pain Inventory Average Pain 4 Pain Right Now 8 My pain is sharp, burning, stabbing, and aching  In the last 24 hours, has pain interfered with the following? General activity 9 Relation with others 0 Enjoyment of life 6 What TIME of day is your pain at its worst? morning , daytime, and evening Sleep (in general) Fair  Pain is worse with: walking, bending, and sitting Pain improves with: heat/ice Relief from Meds:  no meds  Family History  Problem Relation Age of Onset   Leukemia Father    Colon cancer Neg Hx    Esophageal cancer Neg Hx    Rectal cancer Neg Hx    Stomach cancer Neg Hx    Social History   Socioeconomic History   Marital status: Married    Spouse name: Not on file   Number of children: Not on file   Years of education: Not on file   Highest education level: Some college, no degree  Occupational History   Not on file  Tobacco Use   Smoking status: Some Days    Packs/day: .25    Types: Cigarettes   Smokeless tobacco: Never  Vaping Use   Vaping Use: Never used  Substance and Sexual Activity   Alcohol use: Yes    Alcohol/week: 5.0 - 10.0 standard drinks of alcohol     Types: 5 - 10 Standard drinks or equivalent per week   Drug use: No   Sexual activity: Yes  Other Topics Concern   Not on file  Social History Narrative   Not on file   Social Determinants of Health   Financial Resource Strain: Low Risk  (06/12/2022)   Overall Financial Resource Strain (CARDIA)    Difficulty of Paying Living Expenses: Not hard at all  Food Insecurity: No Food Insecurity (06/12/2022)   Hunger Vital Sign    Worried About Running Out of Food in the Last Year: Never true    Ran Out of Food in the Last Year: Never true  Transportation Needs: Unknown (06/12/2022)   PRAPARE - Administrator, Civil Service (Medical): Not on file    Lack of Transportation (Non-Medical): No  Physical Activity: Sufficiently Active (06/12/2022)   Exercise Vital Sign    Days of Exercise per Week: 6 days    Minutes of Exercise per Session: 60 min  Stress: No Stress Concern Present (06/12/2022)   [employer] of Occupational Health - Occupational Stress Questionnaire    Feeling of Stress : Not at all  Social Connections: Unknown (06/12/2022)   Social Connection and Isolation Panel [NHANES]    Frequency of Communication with  Friends and Family: Three times a week    Frequency of Social Gatherings with Friends and Family: Patient declined    Attends Religious Services: Patient declined    Active Member of Clubs or Organizations: No    Attends Engineer, structural: Not on file    Marital Status: Married   Past Surgical History:  Procedure Laterality Date   cyst removed from testicle      INCISIONAL HERNIA REPAIR N/A 02/01/2016   Procedure: LAPAROSCOPIC INCISIONAL HERNIA REPAIR;  Surgeon: Rodman Pickle, MD;  Location: [facility];  Service: General;  Laterality: N/A;   INSERTION OF MESH N/A 02/01/2016   Procedure: INSERTION OF MESH;  Surgeon: De Blanch Kinsinger, MD;  Location: [facility];  Service: General;  Laterality: N/A;   KNEE ARTHROSCOPY Right 12/28/2021   Procedure:  RIGHT KNEE ARTHROSCOPY, DEBRIDEMENT;  Surgeon: Cammy Copa, MD;  Location: [facility];  Service: Orthopedics;  Laterality: Right;   LUMBAR FUSION     SPINAL FUSION     Past Surgical History:  Procedure Laterality Date   cyst removed from testicle      INCISIONAL HERNIA REPAIR N/A 02/01/2016   Procedure: LAPAROSCOPIC INCISIONAL HERNIA REPAIR;  Surgeon: De Blanch Kinsinger, MD;  Location: [facility];  Service: General;  Laterality: N/A;   INSERTION OF MESH N/A 02/01/2016   Procedure: INSERTION OF MESH;  Surgeon: De Blanch Kinsinger, MD;  Location: [facility];  Service: General;  Laterality: N/A;   KNEE ARTHROSCOPY Right 12/28/2021   Procedure: RIGHT KNEE ARTHROSCOPY, DEBRIDEMENT;  Surgeon: Cammy Copa, MD;  Location: [facility] OR;  Service: Orthopedics;  Laterality: Right;   LUMBAR FUSION     SPINAL FUSION     Past Medical History:  Diagnosis Date   Back pain    Diabetes mellitus without complication (HCC)    Hyperlipidemia    Hypertension    BP (!) 135/90   Pulse (!) 113   Ht 5\' 10"  (1.778 m)   Wt 219 lb 12.8 oz (99.7 kg)   SpO2 98%   BMI 31.54 kg/m   Opioid Risk Score:   Fall Risk Score:  `1  Depression screen PHQ 2/9     06/30/2021    2:10 PM 10/28/2019   10:20 AM 12/19/2018   10:19 AM 08/06/2018   11:35 AM 12/09/2017   11:29 AM 09/02/2017    2:25 PM  Depression screen PHQ 2/9  Decreased Interest 0 0 0 0 0 0  Down, Depressed, Hopeless 0 0  0 0 0  PHQ - 2 Score 0 0 0 0 0 0     Review of Systems  Musculoskeletal:  Positive for back pain.       Pelvic pain  All other systems reviewed and are negative.     Objective:   Physical Exam General no acute distress Mood and affect are appropriate Extremities without edema Varicosities noted in the calves bilaterally Negative straight leg raising Ambulates without assistive device no evidence of toe drag or knee instability Lumbar spinous tenderness over the right PSIS area Positive prone compression test Positive  Faber's with pain at the right sacroiliac region with both right-sided and left-sided testing.  Also positive thigh thrust test on the right. Negative distraction test Motor strength is 5/5 bilateral hip flexor knee extensors ankle dorsiflexors.       Assessment & Plan:  1.  Right sacroiliac pain status post L4-5 and L5-S1 fusion.  He has had recurrence approximately 3 years post prior injection.  He has 3/4 positive provocative tests He has been on a home exercise program and has had pain despite this.  He is not a good candidate for long-term nonsteroidal anti-inflammatories given his history of diabetes and hypertension. Will schedule for repeat right sacroiliac injection

## 2022-08-15 NOTE — Progress Notes (Signed)
   Subjective:    Patient ID: Jeffrey Austin, male    DOB: 1960/05/10, 62 y.o.   MRN: [id number]  HPI   PROCEDURE RECORD [facility]   Name: Bond Grieshop DOB:1960-03-20 MRN: [id number]  Date:08/15/2022  Physician: Claudette Laws, MD    Nurse/CMA: Charise Carwin MA  Allergies: No Known Allergies  Consent Signed: Yes.    Is patient diabetic? Yes.    CBG today? unknown  Pregnant: No. LMP: No LMP for male patient. (age 33-55)  Anticoagulants: no Anti-inflammatory: no Antibiotics: no  Procedure: Sacroiliac Steroid Injection  Position: Prone Start Time: 12:59 pm  End Time: 1:07 pm  Fluoro Time: 25::  RN/CMA Brihany Butch MA Conley Delisle MA    Time 12:38 pm 1:10 pm    BP 166/99 167/106    Pulse 110 112    Respirations 16 16    O2 Sat 98 98    S/S 6 6    Pain Level 8/10 3/10     D/C home with self, [name] & O X 3, D/C instructions reviewed, and sits independently.        Review of Systems     Objective:   Physical Exam        Assessment & Plan:

## 2022-08-16 ENCOUNTER — Encounter: Payer: Medicare HMO | Admitting: Physical Medicine & Rehabilitation

## 2022-08-16 ENCOUNTER — Encounter: Payer: Self-pay | Admitting: Physical Medicine & Rehabilitation

## 2022-08-16 VITALS — BP 166/99 | HR 110 | Ht <= 58 in | Wt 218.0 lb

## 2022-08-16 DIAGNOSIS — M259 Joint disorder, unspecified: Secondary | ICD-10-CM

## 2022-08-16 DIAGNOSIS — M545 Low back pain, unspecified: Secondary | ICD-10-CM | POA: Diagnosis not present

## 2022-08-16 DIAGNOSIS — Z981 Arthrodesis status: Secondary | ICD-10-CM | POA: Diagnosis not present

## 2022-08-16 DIAGNOSIS — M533 Sacrococcygeal disorders, not elsewhere classified: Secondary | ICD-10-CM | POA: Diagnosis not present

## 2022-08-16 MED ORDER — BETAMETHASONE SOD PHOS & ACET 6 (3-3) MG/ML IJ SUSP
3.0000 mg | Freq: Once | INTRAMUSCULAR | Status: AC
  Administered 2022-08-16: 3 mg via INTRA_ARTICULAR

## 2022-08-16 MED ORDER — IOHEXOL 180 MG/ML  SOLN
1.0000 mL | Freq: Once | INTRAMUSCULAR | Status: AC
  Administered 2022-08-16: 1 mL

## 2022-08-16 MED ORDER — LIDOCAINE HCL 1 % IJ SOLN
5.0000 mL | Freq: Once | INTRAMUSCULAR | Status: AC
  Administered 2022-08-16: 5 mL

## 2022-08-16 MED ORDER — LIDOCAINE HCL (PF) 2 % IJ SOLN
2.0000 mL | Freq: Once | INTRAMUSCULAR | Status: AC
  Administered 2022-08-16: 2 mL

## 2022-08-16 NOTE — Patient Instructions (Signed)
Sacroiliac injection was performed today. A combination of numbing medicine (lidocaine) plus a cortisone medicine (betamethasone) was injected. The injection was done under x-ray guidance. This procedure has been performed to help reduce low back and buttocks pain as well as potentially hip pain. The duration of this injection is variable lasting from hours to  Months. It may repeated if needed. 

## 2022-08-16 NOTE — Progress Notes (Signed)

## 2022-11-21 ENCOUNTER — Other Ambulatory Visit: Payer: Self-pay | Admitting: Family Medicine

## 2022-12-30 ENCOUNTER — Other Ambulatory Visit: Payer: Self-pay | Admitting: Family Medicine

## 2022-12-30 DIAGNOSIS — I10 Essential (primary) hypertension: Secondary | ICD-10-CM

## 2023-01-08 ENCOUNTER — Telehealth: Payer: Self-pay | Admitting: *Deleted

## 2023-01-08 NOTE — Telephone Encounter (Signed)
LMOM for pt to return call to schedule AWVI.

## 2023-01-23 ENCOUNTER — Telehealth: Payer: Self-pay

## 2023-01-23 NOTE — Patient Outreach (Signed)
Attempted to contact patient regarding AWV, DM eye. Left voicemail for patient to return my call at [phone].  Nicholes Rough, CMA Care Guide [employer]

## 2023-06-11 ENCOUNTER — Other Ambulatory Visit: Payer: Self-pay | Admitting: Family Medicine

## 2023-06-11 DIAGNOSIS — I10 Essential (primary) hypertension: Secondary | ICD-10-CM

## 2023-06-11 MED ORDER — LOSARTAN POTASSIUM-HCTZ 100-25 MG PO TABS: 1.0000 | ORAL_TABLET | Freq: Every day | ORAL | 1 refills | Status: DC

## 2023-06-11 MED ORDER — METFORMIN HCL 1000 MG PO TABS: 1000.0000 mg | ORAL_TABLET | Freq: Two times a day (BID) | ORAL | 1 refills | Status: DC

## 2023-06-11 MED ORDER — AMLODIPINE BESYLATE 5 MG PO TABS: 5.0000 mg | ORAL_TABLET | Freq: Every day | ORAL | 1 refills | Status: DC

## 2023-06-11 MED ORDER — EMPAGLIFLOZIN 25 MG PO TABS: 25.0000 mg | ORAL_TABLET | Freq: Every day | ORAL | 1 refills | Status: DC

## 2023-06-11 MED ORDER — ATORVASTATIN CALCIUM 40 MG PO TABS: 40.0000 mg | ORAL_TABLET | Freq: Every day | ORAL | 1 refills | Status: DC

## 2023-06-11 NOTE — Telephone Encounter (Signed)
 Copied from CRM [id number]. Topic: Clinical - Medication Refill >> Jun 11, 2023 12:48 PM Lennart Pall wrote: Most Recent Primary Care Visit:  Provider: Sharlene Dory  Department: [facility]  Visit Type: OFFICE VISIT  Date: 06/22/2022  Medication: metFORMIN (GLUCOPHAGE) 1000 MG tablet  ;  amLODipine (NORVASC) 5 MG tablet ;   losartan-hydrochlorothiazide (HYZAAR) 100-25 MG tablet ;  atorvastatin (LIPITOR) 40 MG tablet ;  JARDIANCE 25 MG TABS tablet   Has the patient contacted their pharmacy? Yes (Agent: If no, request that the patient contact the pharmacy for the refill. If patient does not wish to contact the pharmacy document the reason why and proceed with request.) (Agent: If yes, when and what did the pharmacy advise?)  Is this the correct pharmacy for this prescription? Yes If no, delete pharmacy and type the correct one.  This is the patient's preferred pharmacy:  [facility] - [place], [place] - [address] [place] [address] Phone: [phone] Fax: [phone]  Has the prescription been filled recently? Yes  Is the patient out of the medication? Yes  Has the patient been seen for an appointment in the last year OR does the patient have an upcoming appointment? Yes  Can we respond through MyChart? Yes  Agent: Please be advised that Rx refills may take up to 3 business days. We ask that you follow-up with your pharmacy.

## 2023-06-11 NOTE — Telephone Encounter (Signed)
 Last Fill: Metformin: 11/21/22     Amlodipine: 12/31/22     Hyzaar: 08/08/22     Atorvastatin: 08/08/22     Jardiance: 12/31/22  Last OV: 06/22/22 Next OV: 08/05/23   Routing to provider for review/authorization.

## 2023-08-05 ENCOUNTER — Encounter: Admitting: Family Medicine

## 2023-08-14 ENCOUNTER — Encounter: Payer: Self-pay | Admitting: Family Medicine

## 2023-08-14 ENCOUNTER — Ambulatory Visit: Admitting: Family Medicine

## 2023-08-14 VITALS — BP 138/78 | HR 100 | Temp 98.0°F | Resp 16 | Ht 71.0 in | Wt 207.4 lb

## 2023-08-14 DIAGNOSIS — E1165 Type 2 diabetes mellitus with hyperglycemia: Secondary | ICD-10-CM | POA: Diagnosis not present

## 2023-08-14 DIAGNOSIS — Z7984 Long term (current) use of oral hypoglycemic drugs: Secondary | ICD-10-CM

## 2023-08-14 DIAGNOSIS — I10 Essential (primary) hypertension: Secondary | ICD-10-CM

## 2023-08-14 NOTE — Patient Instructions (Addendum)
 Give Jeffrey Austin 2-3 business days to get the results of your labs back.  ? ?Keep the diet clean and stay active. ? ?Please get me a copy of your advanced directive form at your convenience.  ? ?If you do not hear anything about your referral in the next 1-2 weeks, call our office and ask for an update. ? ?Let Jeffrey Austin know if you need anything. ?

## 2023-08-14 NOTE — Progress Notes (Signed)
 Subjective:   Chief Complaint  Patient presents with   Annual Exam    CPE    Jeffrey Austin is a 63 y.o. male here for follow-up of diabetes.   Jeffrey Austin does not routinely ck his sugars.  Patient does not require insulin .   Medications include: Jardiance  25 mg/d, metformin  Diet is OK.  Exercise: walking  Hypertension Patient presents for hypertension follow up. He does not monitor home blood pressures. He is compliant with medications- Hyzaar 100-25 mg/d, Norvasc  5 mg/d. Patient has these side effects of medication: none Diet/exercise as above.  No CP or SOB.   Past Medical History:  Diagnosis Date   Back pain    Diabetes mellitus without complication (HCC)    Hyperlipidemia    Hypertension      Related testing: Retinal exam: Due Pneumovax: not done  Objective:  BP 138/78 (BP Location: Left Arm, Patient Position: Sitting)   Pulse 100   Temp 98 F (36.7 C) (Oral)   Resp 16   Ht 5' 11 (1.803 m)   Wt 207 lb 6.4 oz (94.1 kg)   SpO2 99%   BMI 28.93 kg/m  General:  Well developed, well nourished, in no apparent distress Skin:  Warm, no pallor or diaphoresis Head:  Normocephalic, atraumatic Eyes:  Pupils equal and round, sclera anicteric without injection  Lungs:  CTAB, no access msc use Cardio:  RRR, no bruits, no LE edema Musculoskeletal:  Symmetrical muscle groups noted without atrophy or deformity Neuro:  Sensation intact to pinprick on feet Psych: Age appropriate judgment and insight  Assessment:   Type 2 diabetes mellitus with hyperglycemia, without long-term current use of insulin  (HCC) - Plan: CBC, Comprehensive metabolic panel with GFR, Lipid panel, Microalbumin / creatinine urine ratio, Hemoglobin A1c, Ambulatory referral to Ophthalmology  Essential hypertension   Plan:   Chronic, stable. Cont Jardiance  25 mg/d, metformin  1000 mg bid. Counseled on diet and exercise. Chronic, stable. Cont Hyzaar 100-25 mg/d, Norvasc  5 mg/d. Needs to cut down on  alcohol consumption.  F/u in 6 mo. The patient voiced understanding and agreement to the plan.  Shellie Dials [place], DO 08/14/23 1:36 PM

## 2023-08-15 ENCOUNTER — Ambulatory Visit: Payer: Self-pay | Admitting: Family Medicine

## 2023-08-15 ENCOUNTER — Other Ambulatory Visit (HOSPITAL_COMMUNITY): Payer: Self-pay

## 2023-08-15 ENCOUNTER — Telehealth: Payer: Self-pay

## 2023-08-15 ENCOUNTER — Other Ambulatory Visit (HOSPITAL_BASED_OUTPATIENT_CLINIC_OR_DEPARTMENT_OTHER): Payer: Self-pay

## 2023-08-15 LAB — CBC
HCT: 43 % (ref 38.5–50.0)
Hemoglobin: 15 g/dL (ref 13.2–17.1)
MCH: 34.6 pg — ABNORMAL HIGH (ref 27.0–33.0)
MCHC: 34.9 g/dL (ref 32.0–36.0)
MCV: 99.3 fL (ref 80.0–100.0)
MPV: 9.8 fL (ref 7.5–12.5)
Platelets: 160 10*3/uL (ref 140–400)
RBC: 4.33 10*6/uL (ref 4.20–5.80)
RDW: 12.2 % (ref 11.0–15.0)
WBC: 6.6 10*3/uL (ref 3.8–10.8)

## 2023-08-15 LAB — COMPREHENSIVE METABOLIC PANEL WITH GFR
AG Ratio: 1.7 (calc) (ref 1.0–2.5)
ALT: 28 U/L (ref 9–46)
AST: 29 U/L (ref 10–35)
Albumin: 4.5 g/dL (ref 3.6–5.1)
Alkaline phosphatase (APISO): 61 U/L (ref 35–144)
BUN/Creatinine Ratio: 14 (calc) (ref 6–22)
BUN: 20 mg/dL (ref 7–25)
CO2: 30 mmol/L (ref 20–32)
Calcium: 9.9 mg/dL (ref 8.6–10.3)
Chloride: 92 mmol/L — ABNORMAL LOW (ref 98–110)
Creat: 1.38 mg/dL — ABNORMAL HIGH (ref 0.70–1.35)
Globulin: 2.7 g/dL (ref 1.9–3.7)
Glucose, Bld: 139 mg/dL — ABNORMAL HIGH (ref 65–99)
Potassium: 4.2 mmol/L (ref 3.5–5.3)
Sodium: 136 mmol/L (ref 135–146)
Total Bilirubin: 0.7 mg/dL (ref 0.2–1.2)
Total Protein: 7.2 g/dL (ref 6.1–8.1)
eGFR: 58 mL/min/{1.73_m2} — ABNORMAL LOW (ref >=60)

## 2023-08-15 LAB — HEMOGLOBIN A1C
Hgb A1c MFr Bld: 6.7 % — ABNORMAL HIGH (ref <5.7)
Mean Plasma Glucose: 146 mg/dL
eAG (mmol/L): 8.1 mmol/L

## 2023-08-15 LAB — LIPID PANEL
Cholesterol: 165 mg/dL (ref <200)
HDL: 89 mg/dL (ref >=40)
LDL Cholesterol (Calc): 57 mg/dL
Non-HDL Cholesterol (Calc): 76 mg/dL (ref <130)
Total CHOL/HDL Ratio: 1.9 (calc) (ref <5.0)
Triglycerides: 106 mg/dL (ref <150)

## 2023-08-15 LAB — MICROALBUMIN / CREATININE URINE RATIO
Creatinine, Urine: 47 mg/dL (ref 20–320)
Microalb Creat Ratio: 1794 mg/g{creat} — ABNORMAL HIGH (ref <30)
Microalb, Ur: 84.3 mg/dL

## 2023-08-15 MED ORDER — FINERENONE 10 MG PO TABS
10.0000 mg | ORAL_TABLET | Freq: Every day | ORAL | 3 refills | Status: DC
  Filled 2023-08-15: qty 30, 30d supply, fill #0
  Filled 2023-09-03: qty 30, 30d supply, fill #1

## 2023-08-15 NOTE — Telephone Encounter (Signed)
 Pharmacy Patient Advocate Encounter   Received notification from Patient Pharmacy that prior authorization for Kerendia 10 is required/requested.   Insurance verification completed.   The patient is insured through [facility] .   Per test claim: PA required; PA submitted to above mentioned insurance via CoverMyMeds Key/confirmation #/EOC [facility] Status is pending

## 2023-08-15 NOTE — Telephone Encounter (Signed)
 Pharmacy Patient Advocate Encounter  Received notification from [employer] that Prior Authorization for Jeffrey Austin 10 has been APPROVED from 08/15/23 to 03/04/24. Unable to obtain price due to refill too soon rejection, last fill date 08/15/23 next available fill date7/5/25   PA #/Case ID/Reference #: [id number]

## 2023-08-19 ENCOUNTER — Other Ambulatory Visit: Payer: Self-pay

## 2023-08-19 DIAGNOSIS — R809 Proteinuria, unspecified: Secondary | ICD-10-CM

## 2023-08-26 ENCOUNTER — Telehealth: Payer: Self-pay | Admitting: Family Medicine

## 2023-08-26 NOTE — Telephone Encounter (Signed)
 Copied from CRM [id number]. Topic: Medicare AWV >> Aug 26, 2023 11:07 AM Nathanel DEL wrote: Reason for CRM: LVM 08/26/2023 to schedule AWV. Please schedule Virtual or Telehealth visits ONLY.   Nathanel Paschal; Care Guide Ambulatory Clinical Support  l [facility] Direct Dial: [phone]

## 2023-08-27 ENCOUNTER — Other Ambulatory Visit (INDEPENDENT_AMBULATORY_CARE_PROVIDER_SITE_OTHER)

## 2023-08-27 DIAGNOSIS — R809 Proteinuria, unspecified: Secondary | ICD-10-CM

## 2023-08-28 ENCOUNTER — Ambulatory Visit: Payer: Self-pay | Admitting: Family Medicine

## 2023-08-28 LAB — BASIC METABOLIC PANEL WITH GFR
BUN/Creatinine Ratio: 15 (calc) (ref 6–22)
BUN: 22 mg/dL (ref 7–25)
CO2: 28 mmol/L (ref 20–32)
Calcium: 10.3 mg/dL (ref 8.6–10.3)
Chloride: 89 mmol/L — ABNORMAL LOW (ref 98–110)
Creat: 1.46 mg/dL — ABNORMAL HIGH (ref 0.70–1.35)
Glucose, Bld: 150 mg/dL — ABNORMAL HIGH (ref 65–99)
Potassium: 4.6 mmol/L (ref 3.5–5.3)
Sodium: 131 mmol/L — ABNORMAL LOW (ref 135–146)
eGFR: 54 mL/min/{1.73_m2} — ABNORMAL LOW (ref >=60)

## 2023-09-03 ENCOUNTER — Other Ambulatory Visit: Payer: Self-pay

## 2023-09-03 ENCOUNTER — Telehealth: Payer: Self-pay

## 2023-09-03 MED ORDER — FINERENONE 10 MG PO TABS: 10.0000 mg | ORAL_TABLET | Freq: Every day | ORAL | 0 refills | Status: DC

## 2023-09-03 NOTE — Telephone Encounter (Signed)
 Copied from CRM [id number]. Topic: Clinical - Prescription Issue >> Sep 03, 2023 10:43 AM Corin V wrote: Reason for CRM: Patient stated the Finerenone  10 MG TABS went to the [facility] and was supposed to go to [facility]. He received an email from [facility] with this code: [id number] in case it is needed to confirm the pharmacy to send the script.

## 2023-10-14 ENCOUNTER — Other Ambulatory Visit: Payer: Self-pay | Admitting: Family Medicine

## 2023-10-28 ENCOUNTER — Other Ambulatory Visit: Payer: Self-pay | Admitting: Family Medicine

## 2023-10-28 DIAGNOSIS — I10 Essential (primary) hypertension: Secondary | ICD-10-CM

## 2023-11-01 ENCOUNTER — Other Ambulatory Visit: Payer: Self-pay | Admitting: Family Medicine
